# Patient Record
Sex: Male | Born: 1960
Health system: Southern US, Community
[De-identification: ages and names within clinical notes are randomized; demographics above are authoritative.]

## PROBLEM LIST (undated history)

## (undated) DIAGNOSIS — Z9289 Personal history of other medical treatment: Secondary | ICD-10-CM

## (undated) DIAGNOSIS — R4182 Altered mental status, unspecified: Secondary | ICD-10-CM

## (undated) DIAGNOSIS — K860 Alcohol-induced chronic pancreatitis: Secondary | ICD-10-CM

## (undated) DIAGNOSIS — F10239 Alcohol dependence with withdrawal, unspecified: Secondary | ICD-10-CM

## (undated) DIAGNOSIS — D689 Coagulation defect, unspecified: Secondary | ICD-10-CM

## (undated) DIAGNOSIS — I499 Cardiac arrhythmia, unspecified: Secondary | ICD-10-CM

## (undated) DIAGNOSIS — Z832 Family history of diseases of the blood and blood-forming organs and certain disorders involving the immune mechanism: Secondary | ICD-10-CM

## (undated) DIAGNOSIS — R519 Headache, unspecified: Secondary | ICD-10-CM

## (undated) DIAGNOSIS — K859 Acute pancreatitis without necrosis or infection, unspecified: Secondary | ICD-10-CM

## (undated) DIAGNOSIS — F1020 Alcohol dependence, uncomplicated: Secondary | ICD-10-CM

## (undated) DIAGNOSIS — K253 Acute gastric ulcer without hemorrhage or perforation: Secondary | ICD-10-CM

## (undated) DIAGNOSIS — D62 Acute posthemorrhagic anemia: Secondary | ICD-10-CM

## (undated) DIAGNOSIS — E46 Unspecified protein-calorie malnutrition: Secondary | ICD-10-CM

## (undated) DIAGNOSIS — K861 Other chronic pancreatitis: Secondary | ICD-10-CM

## (undated) DIAGNOSIS — K921 Melena: Secondary | ICD-10-CM

## (undated) DIAGNOSIS — K219 Gastro-esophageal reflux disease without esophagitis: Secondary | ICD-10-CM

## (undated) DIAGNOSIS — K7011 Alcoholic hepatitis with ascites: Secondary | ICD-10-CM

## (undated) DIAGNOSIS — F1910 Other psychoactive substance abuse, uncomplicated: Secondary | ICD-10-CM

## (undated) DIAGNOSIS — H1131 Conjunctival hemorrhage, right eye: Secondary | ICD-10-CM

## (undated) DIAGNOSIS — F410 Panic disorder [episodic paroxysmal anxiety] without agoraphobia: Secondary | ICD-10-CM

## (undated) DIAGNOSIS — E876 Hypokalemia: Secondary | ICD-10-CM

## (undated) DIAGNOSIS — K709 Alcoholic liver disease, unspecified: Secondary | ICD-10-CM

## (undated) DIAGNOSIS — N5089 Other specified disorders of the male genital organs: Secondary | ICD-10-CM

## (undated) DIAGNOSIS — Z8619 Personal history of other infectious and parasitic diseases: Secondary | ICD-10-CM

## (undated) DIAGNOSIS — K8689 Other specified diseases of pancreas: Secondary | ICD-10-CM

## (undated) DIAGNOSIS — Z8719 Personal history of other diseases of the digestive system: Secondary | ICD-10-CM

## (undated) DIAGNOSIS — I81 Portal vein thrombosis: Secondary | ICD-10-CM

## (undated) DIAGNOSIS — R569 Unspecified convulsions: Secondary | ICD-10-CM

## (undated) DIAGNOSIS — R51 Headache: Secondary | ICD-10-CM

## (undated) DIAGNOSIS — R079 Chest pain, unspecified: Secondary | ICD-10-CM

## (undated) DIAGNOSIS — I10 Essential (primary) hypertension: Secondary | ICD-10-CM

## (undated) DIAGNOSIS — K2920 Alcoholic gastritis without bleeding: Secondary | ICD-10-CM

## (undated) DIAGNOSIS — R251 Tremor, unspecified: Secondary | ICD-10-CM

## (undated) DIAGNOSIS — G40909 Epilepsy, unspecified, not intractable, without status epilepticus: Principal | ICD-10-CM

## (undated) HISTORY — DX: Personal history of other infectious and parasitic diseases: Z86.19

## (undated) HISTORY — PX: ESOPHAGOGASTRODUODENOSCOPY: SHX1529

## (undated) HISTORY — DX: Other psychoactive substance abuse, uncomplicated: F19.10

## (undated) HISTORY — PX: COLONOSCOPY: SHX174

## (undated) HISTORY — PX: HEMORRHOID SURGERY: SHX153

## (undated) HISTORY — PX: PANCREAS SURGERY: SHX731

## (undated) HISTORY — PX: UPPER GASTROINTESTINAL ENDOSCOPY: SHX188

## (undated) HISTORY — PX: INGUINAL HERNIA REPAIR: SUR1180

---

## 2009-04-08 LAB — METABOLIC PANEL, BASIC
Anion gap: 13 mmol/L (ref 5–15)
BUN/Creatinine ratio: 13 (ref 12–20)
BUN: 9 MG/DL (ref 7–18)
CO2: 25 MMOL/L (ref 21–32)
Calcium: 7.7 MG/DL — ABNORMAL LOW (ref 8.4–10.4)
Chloride: 105 MMOL/L (ref 100–108)
Creatinine: 0.7 mg/dL (ref 0.6–1.3)
GFR est AA: >60 mL/min/{1.73_m2} (ref >60)
GFR est non-AA: >60 mL/min/{1.73_m2} (ref >60)
Glucose: 116 MG/DL — ABNORMAL HIGH (ref 74–99)
Potassium: 3.4 MMOL/L — ABNORMAL LOW (ref 3.5–5.5)
Sodium: 143 MMOL/L (ref 136–145)

## 2009-04-08 LAB — DRUG SCREEN, URINE
AMPHETAMINES: NEGATIVE
BARBITURATES: NEGATIVE
BENZODIAZEPINES: NEGATIVE
COCAINE: NEGATIVE
METHADONE: NEGATIVE
OPIATES: NEGATIVE
PCP(PHENCYCLIDINE): NEGATIVE
THC (TH-CANNABINOL): NEGATIVE

## 2009-04-08 LAB — CBC WITH AUTOMATED DIFF
ABS. EOSINOPHILS: 0 10*3/uL (ref 0.0–0.4)
ABS. LYMPHOCYTES: 0.8 10*3/uL (ref 0.8–3.5)
ABS. MONOCYTES: 0.8 10*3/uL (ref 0–1.0)
ABS. NEUTROPHILS: 7.8 10*3/uL (ref 1.8–8.0)
BASOPHILS: 0 % (ref 0–3)
EOSINOPHILS: 0 % (ref 0–5)
HCT: 41.7 % (ref 37.0–49.0)
HGB: 14.4 g/dL (ref 13.0–16.0)
LYMPHOCYTES: 8 % — ABNORMAL LOW (ref 20–51)
MCH: 32.5 PG (ref 25.0–35.0)
MCHC: 34.5 g/dL (ref 31.0–37.0)
MCV: 94.1 FL (ref 78.0–98.0)
MONOCYTES: 9 % (ref 2–9)
MPV: 6.1 FL — ABNORMAL LOW (ref 7.4–10.4)
NEUTROPHILS: 83 % — ABNORMAL HIGH (ref 42–75)
PLATELET: 180 10*3/uL (ref 130–400)
RBC: 4.43 M/uL — ABNORMAL LOW (ref 4.50–5.30)
RDW: 15 % — ABNORMAL HIGH (ref 11.5–14.5)
WBC: 9.5 10*3/uL (ref 4.5–13.0)

## 2009-04-08 LAB — HEPATIC FUNCTION PANEL
A-G Ratio: 1.1 (ref 0.8–1.7)
ALT (SGPT): 54 U/L (ref 30–65)
AST (SGOT): 51 U/L — ABNORMAL HIGH (ref 15–37)
Albumin: 3.5 g/dL (ref 3.4–5.0)
Alk. phosphatase: 229 U/L — ABNORMAL HIGH (ref 50–136)
Bilirubin, direct: 0.1 MG/DL (ref 0.0–0.3)
Bilirubin, total: 0.4 MG/DL (ref 0.1–0.9)
Globulin: 3.1 g/dL (ref 2.0–4.0)
Protein, total: 6.6 g/dL (ref 6.4–8.2)

## 2009-04-08 LAB — URINALYSIS W/ RFLX MICROSCOPIC
Bilirubin: NEGATIVE
Blood: NEGATIVE
Glucose: NEGATIVE MG/DL
Ketone: 15 MG/DL — AB
Leukocyte Esterase: NEGATIVE
Nitrites: NEGATIVE
Specific gravity: >1.03 — ABNORMAL HIGH (ref 1.003–1.030)
Urobilinogen: 0.2 EU/DL (ref 0.2–1.0)
pH (UA): 5.5 (ref 5.0–8.0)

## 2009-04-08 LAB — URINE MICROSCOPIC ONLY
Bacteria: NEGATIVE /HPF
Epithelial cells: NEGATIVE /LPF (ref 0–5)
RBC: NEGATIVE /HPF (ref 0–5)
WBC: NEGATIVE /HPF (ref 0–4)

## 2009-04-08 LAB — LIPASE: Lipase: 9607 U/L — ABNORMAL HIGH (ref 73–393)

## 2009-04-08 LAB — ETHYL ALCOHOL: ALCOHOL(ETHYL),SERUM: 260 MG/DL — ABNORMAL HIGH (ref 0–3)

## 2009-04-09 LAB — METABOLIC PANEL, BASIC
Anion gap: 13 mmol/L (ref 5–15)
BUN/Creatinine ratio: 10 — ABNORMAL LOW (ref 12–20)
BUN: 7 MG/DL (ref 7–18)
CO2: 25 MMOL/L (ref 21–32)
Calcium: 8.2 MG/DL — ABNORMAL LOW (ref 8.4–10.4)
Chloride: 104 MMOL/L (ref 100–108)
Creatinine: 0.7 mg/dL (ref 0.6–1.3)
GFR est AA: >60 mL/min/{1.73_m2} (ref >60)
GFR est non-AA: >60 mL/min/{1.73_m2} (ref >60)
Glucose: 121 MG/DL — ABNORMAL HIGH (ref 74–99)
Potassium: 4.2 MMOL/L (ref 3.5–5.5)
Sodium: 142 MMOL/L (ref 136–145)

## 2009-04-09 LAB — HEPATIC FUNCTION PANEL
A-G Ratio: 1.1 (ref 0.8–1.7)
ALT (SGPT): 47 U/L (ref 30–65)
AST (SGOT): 35 U/L (ref 15–37)
Albumin: 3.2 g/dL — ABNORMAL LOW (ref 3.4–5.0)
Alk. phosphatase: 222 U/L — ABNORMAL HIGH (ref 50–136)
Bilirubin, direct: 0.2 MG/DL (ref 0.0–0.3)
Bilirubin, total: 0.9 MG/DL (ref 0.1–0.9)
Globulin: 2.9 g/dL (ref 2.0–4.0)
Protein, total: 6.1 g/dL — ABNORMAL LOW (ref 6.4–8.2)

## 2009-04-09 LAB — LD: LD: 179 U/L (ref 100–190)

## 2009-04-09 LAB — LIPASE: Lipase: 2864 U/L — ABNORMAL HIGH (ref 73–393)

## 2009-04-09 LAB — PHENYTOIN: Phenytoin: 1.2 ug/mL — ABNORMAL LOW (ref 10.0–20.0)

## 2009-04-09 LAB — PROTHROMBIN TIME + INR
INR: 0.9 (ref 0.0–1.2)
Prothrombin time: 12.5 SECS (ref 11.5–15.2)

## 2009-04-09 LAB — AMYLASE: Amylase: 373 U/L — ABNORMAL HIGH (ref 25–115)

## 2009-04-10 LAB — CBC WITH AUTOMATED DIFF
ABS. LYMPHOCYTES: 1 10*3/uL (ref 0.8–3.5)
ABS. MONOCYTES: 0.7 10*3/uL (ref 0–1.0)
ABS. NEUTROPHILS: 4.5 10*3/uL (ref 1.8–8.0)
BASOPHILS: 1 % (ref 0–3)
EOSINOPHILS: 1 % (ref 0–5)
HCT: 40.8 % (ref 37.0–49.0)
HGB: 13.8 g/dL (ref 13.0–16.0)
LYMPHOCYTES: 16 % — ABNORMAL LOW (ref 20–51)
MCH: 32.3 PG (ref 25.0–35.0)
MCHC: 33.9 g/dL (ref 31.0–37.0)
MCV: 95.1 FL (ref 78.0–98.0)
MONOCYTES: 12 % — ABNORMAL HIGH (ref 2–9)
MPV: 7 FL — ABNORMAL LOW (ref 7.4–10.4)
NEUTROPHILS: 70 % (ref 42–75)
PLATELET: 115 10*3/uL — ABNORMAL LOW (ref 130–400)
RBC: 4.29 M/uL — ABNORMAL LOW (ref 4.50–5.30)
RDW: 15.4 % — ABNORMAL HIGH (ref 11.5–14.5)
WBC: 6.3 10*3/uL (ref 4.5–13.0)

## 2009-04-10 LAB — METABOLIC PANEL, COMPREHENSIVE
A-G Ratio: 1 (ref 0.8–1.7)
ALT (SGPT): 41 U/L (ref 30–65)
AST (SGOT): 25 U/L (ref 15–37)
Albumin: 3.1 g/dL — ABNORMAL LOW (ref 3.4–5.0)
Alk. phosphatase: 201 U/L — ABNORMAL HIGH (ref 50–136)
Anion gap: 12 mmol/L (ref 5–15)
BUN/Creatinine ratio: 7 — ABNORMAL LOW (ref 12–20)
BUN: 4 MG/DL — ABNORMAL LOW (ref 7–18)
Bilirubin, total: 0.9 MG/DL (ref 0.1–0.9)
CO2: 24 MMOL/L (ref 21–32)
Calcium: 8.2 MG/DL — ABNORMAL LOW (ref 8.4–10.4)
Chloride: 98 MMOL/L — ABNORMAL LOW (ref 100–108)
Creatinine: 0.6 mg/dL (ref 0.6–1.3)
GFR est AA: >60 mL/min/{1.73_m2} (ref >60)
GFR est non-AA: >60 mL/min/{1.73_m2} (ref >60)
Globulin: 3.1 g/dL (ref 2.0–4.0)
Glucose: 67 MG/DL — ABNORMAL LOW (ref 74–99)
Potassium: 4 MMOL/L (ref 3.5–5.5)
Protein, total: 6.2 g/dL — ABNORMAL LOW (ref 6.4–8.2)
Sodium: 134 MMOL/L — ABNORMAL LOW (ref 136–145)

## 2009-04-10 LAB — MAGNESIUM: Magnesium: 1.8 MG/DL (ref 1.8–2.4)

## 2009-04-10 LAB — HEPATIC FUNCTION PANEL
A-G Ratio: 1 (ref 0.8–1.7)
ALT (SGPT): 39 U/L (ref 30–65)
AST (SGOT): 24 U/L (ref 15–37)
Albumin: 3.1 g/dL — ABNORMAL LOW (ref 3.4–5.0)
Alk. phosphatase: 208 U/L — ABNORMAL HIGH (ref 50–136)
Bilirubin, direct: 0.2 MG/DL (ref 0.0–0.3)
Bilirubin, total: 1 MG/DL — ABNORMAL HIGH (ref 0.1–0.9)
Globulin: 3.1 g/dL (ref 2.0–4.0)
Protein, total: 6.2 g/dL — ABNORMAL LOW (ref 6.4–8.2)

## 2009-04-10 LAB — LIPASE: Lipase: 1463 U/L — ABNORMAL HIGH (ref 73–393)

## 2009-04-10 LAB — PHENYTOIN: Phenytoin: 5.4 ug/mL — ABNORMAL LOW (ref 10.0–20.0)

## 2009-04-10 LAB — AMYLASE: Amylase: 186 U/L — ABNORMAL HIGH (ref 25–115)

## 2009-04-10 LAB — PHOSPHORUS: Phosphorus: 2.7 MG/DL (ref 2.5–4.9)

## 2009-04-11 LAB — METABOLIC PANEL, BASIC
Anion gap: 15 mmol/L (ref 5–15)
BUN/Creatinine ratio: 9 — ABNORMAL LOW (ref 12–20)
BUN: 6 MG/DL — ABNORMAL LOW (ref 7–18)
CO2: 24 MMOL/L (ref 21–32)
Calcium: 8.7 MG/DL (ref 8.4–10.4)
Chloride: 99 MMOL/L — ABNORMAL LOW (ref 100–108)
Creatinine: 0.7 mg/dL (ref 0.6–1.3)
GFR est AA: >60 mL/min/{1.73_m2} (ref >60)
GFR est non-AA: >60 mL/min/{1.73_m2} (ref >60)
Glucose: 67 MG/DL — ABNORMAL LOW (ref 74–99)
Potassium: 4.4 MMOL/L (ref 3.5–5.5)
Sodium: 138 MMOL/L (ref 136–145)

## 2009-04-11 LAB — CBC WITH AUTOMATED DIFF
ABS. LYMPHOCYTES: 1 10*3/uL (ref 0.8–3.5)
ABS. MONOCYTES: 1 10*3/uL (ref 0–1.0)
ABS. NEUTROPHILS: 4.3 10*3/uL (ref 1.8–8.0)
BASOPHILS: 0 % (ref 0–3)
EOSINOPHILS: 1 % (ref 0–5)
HCT: 40.7 % (ref 37.0–49.0)
HGB: 14 g/dL (ref 13.0–16.0)
LYMPHOCYTES: 16 % — ABNORMAL LOW (ref 20–51)
MCH: 32.4 PG (ref 25.0–35.0)
MCHC: 34.3 g/dL (ref 31.0–37.0)
MCV: 94.2 FL (ref 78.0–98.0)
MONOCYTES: 16 % — ABNORMAL HIGH (ref 2–9)
MPV: 7.5 FL (ref 7.4–10.4)
NEUTROPHILS: 67 % (ref 42–75)
PLATELET: 162 10*3/uL (ref 130–400)
RBC: 4.32 M/uL — ABNORMAL LOW (ref 4.50–5.30)
RDW: 15.1 % — ABNORMAL HIGH (ref 11.5–14.5)
WBC: 6.4 10*3/uL (ref 4.5–13.0)

## 2009-04-11 LAB — PHENYTOIN: Phenytoin: 6.5 ug/mL — ABNORMAL LOW (ref 10.0–20.0)

## 2009-04-11 LAB — HEPATIC FUNCTION PANEL
A-G Ratio: 1 (ref 0.8–1.7)
ALT (SGPT): 43 U/L (ref 30–65)
AST (SGOT): 23 U/L (ref 15–37)
Albumin: 3.2 g/dL — ABNORMAL LOW (ref 3.4–5.0)
Alk. phosphatase: 188 U/L — ABNORMAL HIGH (ref 50–136)
Bilirubin, direct: 0.1 MG/DL (ref 0.0–0.3)
Bilirubin, total: 0.7 MG/DL (ref 0.1–0.9)
Globulin: 3.2 g/dL (ref 2.0–4.0)
Protein, total: 6.4 g/dL (ref 6.4–8.2)

## 2009-04-11 LAB — MAGNESIUM: Magnesium: 1.9 MG/DL (ref 1.8–2.4)

## 2009-04-11 LAB — AMYLASE: Amylase: 76 U/L (ref 25–115)

## 2009-04-11 LAB — PHOSPHORUS: Phosphorus: 3.4 MG/DL (ref 2.5–4.9)

## 2009-04-12 LAB — METABOLIC PANEL, BASIC
Anion gap: 10 mmol/L (ref 5–15)
BUN/Creatinine ratio: 3 — ABNORMAL LOW (ref 12–20)
BUN: 2 MG/DL — ABNORMAL LOW (ref 7–18)
CO2: 26 MMOL/L (ref 21–32)
Calcium: 8.4 MG/DL (ref 8.4–10.4)
Chloride: 98 MMOL/L — ABNORMAL LOW (ref 100–108)
Creatinine: 0.7 mg/dL (ref 0.6–1.3)
GFR est AA: >60 mL/min/{1.73_m2} (ref >60)
GFR est non-AA: >60 mL/min/{1.73_m2} (ref >60)
Glucose: 115 MG/DL — ABNORMAL HIGH (ref 74–99)
Potassium: 3.8 MMOL/L (ref 3.5–5.5)
Sodium: 134 MMOL/L — ABNORMAL LOW (ref 136–145)

## 2009-04-12 LAB — AMYLASE: Amylase: 73 U/L (ref 25–115)

## 2009-04-12 LAB — HEPATIC FUNCTION PANEL
A-G Ratio: 1.1 (ref 0.8–1.7)
ALT (SGPT): 35 U/L (ref 30–65)
AST (SGOT): 24 U/L (ref 15–37)
Albumin: 3.4 g/dL (ref 3.4–5.0)
Alk. phosphatase: 184 U/L — ABNORMAL HIGH (ref 50–136)
Bilirubin, direct: 0.2 MG/DL (ref 0.0–0.3)
Bilirubin, total: 0.6 MG/DL (ref 0.1–0.9)
Globulin: 3.2 g/dL (ref 2.0–4.0)
Protein, total: 6.6 g/dL (ref 6.4–8.2)

## 2009-04-12 LAB — LIPASE: Lipase: 393 U/L (ref 73–393)

## 2009-04-13 LAB — HEPATIC FUNCTION PANEL
A-G Ratio: 1 (ref 0.8–1.7)
ALT (SGPT): 35 U/L (ref 30–65)
AST (SGOT): 22 U/L (ref 15–37)
Albumin: 2.9 g/dL — ABNORMAL LOW (ref 3.4–5.0)
Alk. phosphatase: 150 U/L — ABNORMAL HIGH (ref 50–136)
Bilirubin, direct: 0.1 MG/DL (ref 0.0–0.3)
Bilirubin, total: 0.3 MG/DL (ref 0.1–0.9)
Globulin: 2.8 g/dL (ref 2.0–4.0)
Protein, total: 5.7 g/dL — ABNORMAL LOW (ref 6.4–8.2)

## 2009-04-13 LAB — METABOLIC PANEL, BASIC
Anion gap: 8 mmol/L (ref 5–15)
BUN/Creatinine ratio: 2 — ABNORMAL LOW (ref 12–20)
BUN: 1 MG/DL — ABNORMAL LOW (ref 7–18)
CO2: 28 MMOL/L (ref 21–32)
Calcium: 8.2 MG/DL — ABNORMAL LOW (ref 8.4–10.4)
Chloride: 102 MMOL/L (ref 100–108)
Creatinine: 0.6 mg/dL (ref 0.6–1.3)
GFR est AA: >60 mL/min/{1.73_m2} (ref >60)
GFR est non-AA: >60 mL/min/{1.73_m2} (ref >60)
Glucose: 116 MG/DL — ABNORMAL HIGH (ref 74–99)
Potassium: 3 MMOL/L — ABNORMAL LOW (ref 3.5–5.5)
Sodium: 138 MMOL/L (ref 136–145)

## 2009-04-13 LAB — LIPASE: Lipase: 269 U/L (ref 73–393)

## 2009-04-13 LAB — AMYLASE: Amylase: 76 U/L (ref 25–115)

## 2009-07-09 LAB — DRUG SCREEN UR - W/ CONFIRM
AMPHETAMINES: NEGATIVE
BARBITURATES: NEGATIVE
BENZODIAZEPINES: NEGATIVE
COCAINE: NEGATIVE
METHADONE: NEGATIVE
OPIATES: POSITIVE — AB
PCP(PHENCYCLIDINE): NEGATIVE
THC (TH-CANNABINOL): NEGATIVE

## 2009-07-09 LAB — METABOLIC PANEL, COMPREHENSIVE
A-G Ratio: 1.1 (ref 0.8–1.7)
ALT (SGPT): 72 U/L — ABNORMAL HIGH (ref 30–65)
AST (SGOT): 115 U/L — ABNORMAL HIGH (ref 15–37)
Albumin: 4 g/dL (ref 3.4–5.0)
Alk. phosphatase: 215 U/L — ABNORMAL HIGH (ref 50–136)
Anion gap: 12 mmol/L (ref 5–15)
BUN/Creatinine ratio: 12 (ref 12–20)
BUN: 7 MG/DL (ref 7–18)
Bilirubin, total: 0.7 MG/DL (ref 0.1–0.9)
CO2: 26 MMOL/L (ref 21–32)
Calcium: 9 MG/DL (ref 8.4–10.4)
Chloride: 102 MMOL/L (ref 100–108)
Creatinine: 0.6 MG/DL (ref 0.6–1.3)
GFR est AA: >60 mL/min/{1.73_m2} (ref >60)
GFR est non-AA: >60 mL/min/{1.73_m2} (ref >60)
Globulin: 3.6 g/dL (ref 2.0–4.0)
Glucose: 106 MG/DL — ABNORMAL HIGH (ref 74–99)
Potassium: 4 MMOL/L (ref 3.5–5.5)
Protein, total: 7.6 g/dL (ref 6.4–8.2)
Sodium: 140 MMOL/L (ref 136–145)

## 2009-07-09 LAB — CBC WITH AUTOMATED DIFF
ABS. LYMPHOCYTES: 1.5 10*3/uL (ref 0.8–3.5)
ABS. MONOCYTES: 0.7 10*3/uL (ref 0–1.0)
ABS. NEUTROPHILS: 3.1 10*3/uL (ref 1.8–8.0)
BASOPHILS: 2 % (ref 0–3)
EOSINOPHILS: 3 % (ref 0–5)
HCT: 45.7 % (ref 37.0–49.0)
HGB: 15.7 g/dL (ref 13.0–16.0)
LYMPHOCYTES: 28 % (ref 20–51)
MCH: 33.2 PG (ref 25.0–35.0)
MCHC: 34.4 g/dL (ref 31.0–37.0)
MCV: 96.3 FL (ref 78.0–98.0)
MONOCYTES: 12 % — ABNORMAL HIGH (ref 2–9)
MPV: 6.7 FL — ABNORMAL LOW (ref 7.4–10.4)
NEUTROPHILS: 55 % (ref 42–75)
PLATELET: 157 10*3/uL (ref 130–400)
RBC: 4.74 M/uL (ref 4.50–5.30)
RDW: 13.7 % (ref 11.5–14.5)
WBC: 5.5 10*3/uL (ref 4.5–13.0)

## 2009-07-09 LAB — CPK-MB PANEL
CK - MB: 0.6 ng/ml (ref 0.5–3.6)
CK-MB Index: 0.8 % (ref 0.0–4.0)
CK: 75 U/L (ref 35–232)
Troponin-I, QT: <0.04 NG/ML (ref 0.00–0.08)

## 2009-07-09 LAB — AMYLASE: Amylase: 458 U/L — ABNORMAL HIGH (ref 25–115)

## 2009-07-09 LAB — LIPASE: Lipase: 5901 U/L — ABNORMAL HIGH (ref 73–393)

## 2009-07-09 LAB — LD: LD: 271 U/L — ABNORMAL HIGH (ref 100–190)

## 2009-07-09 NOTE — H&P (Signed)
[facility]   [address] [place], [place] [address]     HISTORY AND PHYSICAL REPORT    PATIENT: Jeremy Walters, Jeremy Walters  BILLING: [id number] ADMITTED: 07/09/2009  MRN: [id number] LOCATION:  ATTENDING: Nada Libman, M.D.  DICTATING: Amedeo Kinsman, MD      CHIEF COMPLAINT: Nausea, vomiting, and abdominal pain.    HISTORY: The patient is 48 years old with history of alcohol use, history  of recurrent alcohol related pancreatitis, seizure disorder, and history of  large pancreatic pseudocyst requiring endoscopic ultrasound/endoscopic  retrograde cholangiopancreatography, guided cystogastrostomy in 2005, and  seizure disorder. The patient presented to the emergency department with  chief complaint of nausea, vomiting, and upper abdominal pain for 2 days.  He has been drinking every other day for the last 2 months and more heavily  over the last 3 days. He was released from treatment in May. He denies any  hematemesis, melena, or bleeding. He describes vomiting productive of  undigested food. He denies having any seizure. His last seizure was a year  ago. He is currently not using his Dilantin. In the emergency department,  the patient was found to have elevated lipase. His CT scan of the abdomen  is pending. He is being admitted to the medical service for further  management of acute pancreatitis.    REVIEW OF SYSTEMS: No dysuria or hematuria. No headaches. No fever. No  cough. No chest pain. Review of systems otherwise noncontributory.    ALLERGIES: NO KNOWN DRUG ALLERGIES.    SOCIAL HISTORY: The patient drinks alcohol. He is a smoker. He lives with  a friend.    FAMILY HISTORY: Positive for diabetes and hypertension.    HOME MEDICATIONS: The patient is not currently using any medications. He  was using Dilantin a couple of months ago.    MEDICAL HISTORY: As above. Significant for history of pancreatitis,  alcoholism, seizure disorder. History of cystogastrostomy in year 2005 for   large pancreatic pseudocyst. The patient was seen by Dr. Danelle Earthly in the  past.    PHYSICAL EXAMINATION  GENERAL: The patient is alert. He is not presently in distress. Blood  pressure of 140/100, heart rate of 113, temperature 98.9.  HEENT: Head exam shows no signs of trauma. Eyes have intact extraocular  movements. Oral exam has dry oral mucosa with no lesions.  NECK: No cervical adenopathy or thyromegaly.  CHEST: He has clear lung fields bilaterally.  CARDIAC: Regular rhythm with no gallop.  ABDOMEN: Soft. He has epigastric tenderness. He has no guarding or  rigidity. He has hypoactive bowel sounds. No organomegaly.  LOWER EXTREMITIES: Has no extremity edema.  NEUROLOGIC: Strength normal in all extremities. No facial asymmetry.  Cranial nerves are grossly intact. He has no focal deficits.    ADMISSION LABORATORIES: White cell count 5.5, hemoglobin 15.7, hematocrit  45.7, platelets of 157. Sodium of 140, potassium 4.0, chloride 102, CO2 26,  glucose of 106, BUN of 7, creatinine of 0.6. Alkaline phosphatase of 215,  AST of 115, total bilirubin of 0.6, lipase of 5901, amylase of 458.  Troponin of less than 0.04. LDH of 271. CT of the abdomen is presently  pending.    ADMISSION ASSESSMENT AND PLAN  1. Acute pancreatitis, alcohol related.  2. Alcoholism.  3. History of pancreatic pseudocyst in the past.  4. Seizure disorder.  5. History of hypertension.    PLAN: The patient will be admitted to the medical service. We will keep  the patient  n.p.o., hydrate the patient with IV fluids, provide pain  control, proton pump inhibitor. CT scan of the abdomen is presently  pending. If it shows an abnormality, will need to consult GI. The patient  seen by Dr. Danelle Earthly in the past. We will place the patient on delirium  tremens prophylaxis. Monitor his vital signs as he reports history of  hypertension. Will give 1 dose of atenolol. Restart the patient on  Dilantin. The patient is FULL CODE.                        Electronically Signed   Amedeo Kinsman, MD 07/09/2009 19:16   Jeremy Marisue Ivan, MD        NAM:[id number]  D: 07/09/2009 6:22 A T: 07/09/2009 6:54 A  Job #: [id number] CScriptDoc #: [id number]  cc: JEFFREY Cooper Render, M.D.   Jeremy A MOHAMED, MD

## 2009-07-10 LAB — METABOLIC PANEL, COMPREHENSIVE
A-G Ratio: 1.3 (ref 0.8–1.7)
ALT (SGPT): 53 U/L (ref 30–65)
AST (SGOT): 53 U/L — ABNORMAL HIGH (ref 15–37)
Albumin: 3.8 g/dL (ref 3.4–5.0)
Alk. phosphatase: 201 U/L — ABNORMAL HIGH (ref 50–136)
Anion gap: 6 mmol/L (ref 5–15)
BUN/Creatinine ratio: 7 — ABNORMAL LOW (ref 12–20)
BUN: 4 MG/DL — ABNORMAL LOW (ref 7–18)
Bilirubin, total: 1.3 MG/DL — ABNORMAL HIGH (ref 0.1–0.9)
CO2: 31 MMOL/L (ref 21–32)
Calcium: 8.8 MG/DL (ref 8.4–10.4)
Chloride: 95 MMOL/L — ABNORMAL LOW (ref 100–108)
Creatinine: 0.6 MG/DL (ref 0.6–1.3)
GFR est AA: >60 mL/min/{1.73_m2} (ref >60)
GFR est non-AA: >60 mL/min/{1.73_m2} (ref >60)
Globulin: 2.9 g/dL (ref 2.0–4.0)
Glucose: 85 MG/DL (ref 74–99)
Potassium: 3.7 MMOL/L (ref 3.5–5.5)
Protein, total: 6.7 g/dL (ref 6.4–8.2)
Sodium: 132 MMOL/L — ABNORMAL LOW (ref 136–145)

## 2009-07-10 LAB — PHOSPHORUS: Phosphorus: 3 MG/DL (ref 2.5–4.9)

## 2009-07-10 LAB — LIPASE: Lipase: 3290 U/L — ABNORMAL HIGH (ref 73–393)

## 2009-07-10 LAB — AMYLASE: Amylase: 318 U/L — ABNORMAL HIGH (ref 25–115)

## 2009-07-10 LAB — MAGNESIUM: Magnesium: 1.6 MG/DL — ABNORMAL LOW (ref 1.8–2.4)

## 2009-07-11 LAB — CBC WITH AUTOMATED DIFF
ABS. EOSINOPHILS: 0.1 10*3/uL (ref 0.0–0.4)
ABS. LYMPHOCYTES: 0.8 10*3/uL (ref 0.8–3.5)
ABS. MONOCYTES: 0.8 10*3/uL (ref 0–1.0)
ABS. NEUTROPHILS: 3.2 10*3/uL (ref 1.8–8.0)
BASOPHILS: 1 % (ref 0–3)
EOSINOPHILS: 3 % (ref 0–5)
HCT: 42.7 % (ref 37.0–49.0)
HGB: 14.9 g/dL (ref 13.0–16.0)
LYMPHOCYTES: 17 % — ABNORMAL LOW (ref 20–51)
MCH: 34.1 PG (ref 25.0–35.0)
MCHC: 34.9 g/dL (ref 31.0–37.0)
MCV: 97.9 FL (ref 78.0–98.0)
MONOCYTES: 15 % — ABNORMAL HIGH (ref 2–9)
MPV: 7.6 FL (ref 7.4–10.4)
NEUTROPHILS: 64 % (ref 42–75)
PLATELET: 120 10*3/uL — ABNORMAL LOW (ref 130–400)
RBC: 4.36 M/uL — ABNORMAL LOW (ref 4.50–5.30)
RDW: 13.5 % (ref 11.5–14.5)
WBC: 5 10*3/uL (ref 4.5–13.0)

## 2009-07-11 LAB — METABOLIC PANEL, BASIC
Anion gap: 7 mmol/L (ref 5–15)
BUN/Creatinine ratio: 4 — ABNORMAL LOW (ref 12–20)
BUN: 3 MG/DL — ABNORMAL LOW (ref 7–18)
CO2: 31 MMOL/L (ref 21–32)
Calcium: 9 MG/DL (ref 8.4–10.4)
Chloride: 93 MMOL/L — ABNORMAL LOW (ref 100–108)
Creatinine: 0.7 MG/DL (ref 0.6–1.3)
GFR est AA: >60 mL/min/{1.73_m2} (ref >60)
GFR est non-AA: >60 mL/min/{1.73_m2} (ref >60)
Glucose: 113 MG/DL — ABNORMAL HIGH (ref 74–99)
Potassium: 3.6 MMOL/L (ref 3.5–5.5)
Sodium: 131 MMOL/L — ABNORMAL LOW (ref 136–145)

## 2009-07-11 LAB — PHOSPHORUS: Phosphorus: 3.5 MG/DL (ref 2.5–4.9)

## 2009-07-11 LAB — MAGNESIUM: Magnesium: 1.8 MG/DL (ref 1.8–2.4)

## 2009-07-11 LAB — LIPASE: Lipase: 1646 U/L — ABNORMAL HIGH (ref 73–393)

## 2009-07-11 LAB — AMYLASE: Amylase: 193 U/L — ABNORMAL HIGH (ref 25–115)

## 2009-07-12 LAB — METABOLIC PANEL, BASIC
Anion gap: 14 mmol/L (ref 5–15)
BUN/Creatinine ratio: 7 — ABNORMAL LOW (ref 12–20)
BUN: 5 MG/DL — ABNORMAL LOW (ref 7–18)
CO2: 26 MMOL/L (ref 21–32)
Calcium: 8.8 MG/DL (ref 8.4–10.4)
Chloride: 95 MMOL/L — ABNORMAL LOW (ref 100–108)
Creatinine: 0.7 MG/DL (ref 0.6–1.3)
GFR est AA: >60 mL/min/{1.73_m2} (ref >60)
GFR est non-AA: >60 mL/min/{1.73_m2} (ref >60)
Glucose: 76 MG/DL (ref 74–99)
Potassium: 3.4 MMOL/L — ABNORMAL LOW (ref 3.5–5.5)
Sodium: 135 MMOL/L — ABNORMAL LOW (ref 136–145)

## 2009-07-12 LAB — CBC WITH AUTOMATED DIFF
ABS. LYMPHOCYTES: 0.7 10*3/uL — ABNORMAL LOW (ref 0.8–3.5)
ABS. MONOCYTES: 0.8 10*3/uL (ref 0–1.0)
ABS. NEUTROPHILS: 3.4 10*3/uL (ref 1.8–8.0)
BASOPHILS: 0 % (ref 0–3)
EOSINOPHILS: 2 % (ref 0–5)
HCT: 41.7 % (ref 37.0–49.0)
HGB: 14.3 g/dL (ref 13.0–16.0)
LYMPHOCYTES: 14 % — ABNORMAL LOW (ref 20–51)
MCH: 33.2 PG (ref 25.0–35.0)
MCHC: 34.1 g/dL (ref 31.0–37.0)
MCV: 97.1 FL (ref 78.0–98.0)
MONOCYTES: 16 % — ABNORMAL HIGH (ref 2–9)
MPV: 7.7 FL (ref 7.4–10.4)
NEUTROPHILS: 68 % (ref 42–75)
PLATELET: 129 10*3/uL — ABNORMAL LOW (ref 130–400)
RBC: 4.29 M/uL — ABNORMAL LOW (ref 4.50–5.30)
RDW: 13.6 % (ref 11.5–14.5)
WBC: 4.9 10*3/uL (ref 4.5–13.0)

## 2009-07-12 LAB — MAGNESIUM: Magnesium: 1.7 MG/DL — ABNORMAL LOW (ref 1.8–2.4)

## 2009-07-12 LAB — AMYLASE: Amylase: 148 U/L — ABNORMAL HIGH (ref 25–115)

## 2009-07-12 LAB — LIPASE: Lipase: 997 U/L — ABNORMAL HIGH (ref 73–393)

## 2009-07-12 LAB — PHOSPHORUS: Phosphorus: 3.9 MG/DL (ref 2.5–4.9)

## 2009-07-13 LAB — METABOLIC PANEL, BASIC
Anion gap: 10 mmol/L (ref 5–15)
BUN/Creatinine ratio: 4 — ABNORMAL LOW (ref 12–20)
BUN: 3 MG/DL — ABNORMAL LOW (ref 7–18)
CO2: 27 MMOL/L (ref 21–32)
Calcium: 8.7 MG/DL (ref 8.4–10.4)
Chloride: 100 MMOL/L (ref 100–108)
Creatinine: 0.7 MG/DL (ref 0.6–1.3)
GFR est AA: >60 mL/min/{1.73_m2} (ref >60)
GFR est non-AA: >60 mL/min/{1.73_m2} (ref >60)
Glucose: 94 MG/DL (ref 74–99)
Potassium: 3.7 MMOL/L (ref 3.5–5.5)
Sodium: 137 MMOL/L (ref 136–145)

## 2009-07-13 LAB — MAGNESIUM: Magnesium: 1.8 MG/DL (ref 1.8–2.4)

## 2009-07-13 LAB — OPIATES, UR: Opiates,confirmation: POSITIVE

## 2009-07-13 LAB — LIPASE: Lipase: 460 U/L — ABNORMAL HIGH (ref 73–393)

## 2009-07-13 NOTE — Discharge Summary (Unsigned)
[facility]   [address] [place] [address]     DISCHARGE SUMMARY    PATIENT: Jeremy Walters, Jeremy Walters  MRN: [id number] ADMITTED: 07/09/2009  BILLING: [id number] DISCHARGED: 07/13/2009  ATTENDING: Amedeo Kinsman, MD  DICTATING: Marlana Salvage, MD    DIAGNOSES  1. Acute pancreatitis.  2. History of pancreatic pseudocyst status post drainage.  3. Hypertension with poor control.  4. Alcohol abuse.  5. Hypokalemia.  6. Hypomagnesemia.  7. History of seizure disorder.  8. Asymptomatic hyponatremia resolved.    DISCHARGE MEDICATIONS: The patient did not know his medications on  arrival. Therefore these are probable new medications.  1. Clonidine 0.1 mg p.o. every 8 hours.  2. Magnesium oxide 400 mg twice daily.  3. Atenolol 25 mg p.o. b.i.d.  4. Dilantin 100 mg p.o. twice daily.  5. Protonix 40 mg daily.    CONDITION ON DISCHARGE: Stable.    FOLLOWUP CARE: The patient is to follow up with the [facility] in 1  to 2 weeks and with Dr. Danelle Earthly. He can follow up with him in the next 4 to 6  weeks.    ADVANCED DIRECTIVES: None.    PATIENT INSTRUCTIONS AND LIMITATIONS: The patient's diet is a low fat  diet. Activity is as tolerated.    RESTRICTIONS: None.    REASON FOR ADMISSION: Acute alcohol-induced pancreatitis.    ADMISSION PHYSICAL EXAMINATION  GENERAL: The patient is alert and in no acute distress.  VITAL SIGNS: Blood pressure 140/100, heart rate 113, temperature of 98.9.  HEENT: Head shows no signs of trauma. Eyes have intact extraocular  movements. Oral exam shows dry oral mucosa with no lesions.  NECK: No cervical adenopathy or thyromegaly.  CHEST: He has clear lung fields bilaterally.  CARDIAC: Regular rhythm with no gallop.  ABDOMEN: Soft. He has epigastric tenderness. No guarding or rigidity. He  has hypoactive bowel sounds. No organomegaly.  EXTREMITIES: No edema.  NEUROLOGIC: Strength is normal in all extremities. No facial asymmetry.   Cranial nerves grossly intact. No focal deficits.    CONSULTANTS: None during this admission.    LABORATORY AND ANCILLARY DATA: White blood cell count 5.5, hemoglobin  15.7, hematocrit 45.7, platelet count 157,000. Sodium 140, potassium 4.0,  chloride of 102, CO2 of 26, glucose 106, BUN 7, creatinine 0.6. Alkaline  phosphatase 215, AST of 115. Total bilirubin 1.3, magnesium of 1.6. Lipase  was initially 3290, amylase 318. At time of discharge, the lipase was 460.  Troponin was less than 0.04. LDH 271.    Urine drug screen was positive for opiates.    Ultrasound of the abdomen revealed a small amount of fluid adjacent to the  pancreas and hepatic steatosis.    CT of the abdomen revealed findings consistent with acute pancreatitis.    Abdominal x-ray was negative for any ileus or obstruction.    HOSPITAL COURSE AND TREATMENT: This is a 48 year old Caucasian male with  history of alcohol abuse and alcohol-related pancreatitis who presented to  the emergency room complaining of nausea, vomiting, and abdominal pain. He  had not drank for year and started drinking again after the death of a  friend. He did not have any hematemesis, melena, or bleeding but was found  to have an elevated lipase and was admitted to the hospital medicine  division for further management of acute pancreatitis. He was initially  placed n.p.o. and was hydrated with IV fluids and given IV pain control and  placed on a proton  pump inhibitor. Eventually his diet was advanced to a  low-fat diet, which he tolerated without any problems. His blood pressure  was a concern as it was accelerated on admission. He did not know the names  of his blood pressure medications but was not compliant with them anyway.  He was started on clonidine 3 times daily and atenolol with good results.  His vital signs have been stable. His electrolytes have been replaced. He  is ambulating in the hallway and it is appropriate to discharge him at this   time. He agrees to alcohol cessation and understands the importance of  close outpatient followup for his acute and chronic pancreatitis as well as  blood pressure control.        Dictated By: Felicie Morn, PA-C             Preliminary   Marlana Salvage, MD    MAD:[id number]  D: 07/13/2009 10:00 A T: 07/13/2009 11:58 A  Job #: [id number] CScriptDoc #: [id number]  cc: [facility]   Marlana Salvage, MD   PRAMOD Danelle Earthly, MD   Amedeo Kinsman, MD

## 2011-02-26 LAB — CBC WITH AUTOMATED DIFF
ABS. BASOPHILS: 0 10*3/uL (ref 0.0–0.06)
ABS. EOSINOPHILS: 0 10*3/uL (ref 0.0–0.4)
ABS. LYMPHOCYTES: 0.8 10*3/uL — ABNORMAL LOW (ref 0.9–3.6)
ABS. MONOCYTES: 0.7 10*3/uL (ref 0.05–1.2)
ABS. NEUTROPHILS: 4.9 10*3/uL (ref 1.8–8.0)
BASOPHILS: 0 % (ref 0–2)
EOSINOPHILS: 0 % (ref 0–5)
HCT: 41.4 % (ref 36.0–48.0)
HGB: 14.1 g/dL (ref 13.0–16.0)
LYMPHOCYTES: 12 % — ABNORMAL LOW (ref 21–52)
MCH: 32.4 PG (ref 24.0–34.0)
MCHC: 34.1 g/dL (ref 31.0–37.0)
MCV: 95.2 FL (ref 74.0–97.0)
MONOCYTES: 11 % — ABNORMAL HIGH (ref 3–10)
MPV: 10.7 FL (ref 9.2–11.8)
NEUTROPHILS: 77 % — ABNORMAL HIGH (ref 40–73)
PLATELET: 83 10*3/uL — ABNORMAL LOW (ref 135–420)
RBC: 4.35 M/uL — ABNORMAL LOW (ref 4.70–5.50)
RDW: 13.1 % (ref 11.6–14.5)
WBC: 6.5 10*3/uL (ref 4.6–13.2)

## 2011-02-26 LAB — DRUG SCREEN, URINE
AMPHETAMINES: NEGATIVE
BARBITURATES: NEGATIVE
BENZODIAZEPINES: NEGATIVE
COCAINE: NEGATIVE
METHADONE: NEGATIVE
OPIATES: NEGATIVE
PCP(PHENCYCLIDINE): NEGATIVE
THC (TH-CANNABINOL): POSITIVE — AB

## 2011-02-26 LAB — METABOLIC PANEL, BASIC
Anion gap: 20 mmol/L — ABNORMAL HIGH (ref 5–15)
BUN/Creatinine ratio: 5 — ABNORMAL LOW (ref 12–20)
BUN: 7 MG/DL (ref 7–18)
CO2: 21 MMOL/L (ref 21–32)
Calcium: 8.7 MG/DL (ref 8.4–10.4)
Chloride: 95 MMOL/L — ABNORMAL LOW (ref 100–108)
Creatinine: 1.3 MG/DL (ref 0.6–1.3)
GFR est AA: >60 mL/min/{1.73_m2} (ref >60)
GFR est non-AA: >60 mL/min/{1.73_m2} (ref >60)
Glucose: 190 MG/DL — ABNORMAL HIGH (ref 74–99)
Potassium: 3.7 MMOL/L (ref 3.5–5.5)
Sodium: 136 MMOL/L (ref 136–145)

## 2011-02-26 LAB — LIPASE: Lipase: 219 U/L (ref 73–393)

## 2011-02-26 LAB — ETHYL ALCOHOL: ALCOHOL(ETHYL),SERUM: <3 MG/DL (ref 0–3)

## 2011-02-26 LAB — PHENYTOIN: Phenytoin: 0.7 ug/mL — ABNORMAL LOW (ref 10.0–20.0)

## 2011-02-26 NOTE — Procedures (Signed)
[facility]OSCIENCE [facility]ENTER                                                ELECTROENCEPHALOGRAM        PATIENT:    Jeremy Walters, Jeremy Walters  BILLING:        [id number]      DATE:       02/26/2011  MRN:            [id number]       LOCATION:  DICTATING:  Maggie Schwalbe, MD  DOB:            05/13/1961      EEG NUMBER:  [id number]    CLINICAL INFORMATION: The patient presented to the emergency room today  with a seizure recurrence.  A past EEG dated November 2004 relates that the  patient had a history of seizures possibly related to alcohol and was  normal.    DESCRIPTION: This EEG is obtained in the awake state only. The waking  background reveals a 9 cycle per second alfa rhythm posteriorly with normal  posterior-to-anterior gradient. Photic stimulation and hyperventilation do  not alter the background.  Sleep was not obtained.    IMPRESSION: This is a normal awake electroencephalogram.  There is no  evidence of focal lateralized or epileptiform activity.                     Electronically Signed   Maggie Schwalbe, MD 03/09/2011 11:50                                  Maggie Schwalbe, MD    JPH:WMX  D: 02/26/2011 T: 02/26/2011 12:50 P  CQDocID #:  [id number]  CScriptDoc #:  [id number]  cc:   Maggie Schwalbe, MD

## 2011-02-26 NOTE — Procedures (Signed)
NEUROSCIENCE CENTER                                                ELECTROENCEPHALOGRAM        PATIENT:    [name], [name]  BILLING:        [id number]      DATE:       02/26/2011  MRN:            [id number]       LOCATION:  DICTATING:  Saia Derossett P Tawn Fitzner, MD  DOB:            09/30/1961      EEG NUMBER:  [id number]    CLINICAL INFORMATION: The patient presented to the emergency room today  with a seizure recurrence.  A past EEG dated November 2004 relates that the  patient had a history of seizures possibly related to alcohol and was  normal.    DESCRIPTION: This EEG is obtained in the awake state only. The waking  background reveals a 9 cycle per second alfa rhythm posteriorly with normal  posterior-to-anterior gradient. Photic stimulation and hyperventilation do  not alter the background.  Sleep was not obtained.    IMPRESSION: This is a normal awake electroencephalogram.  There is no  evidence of focal lateralized or epileptiform activity.                     Electronically Signed   Akbar Sacra P Gaylen Venning, MD 03/09/2011 11:50                                  Cheralyn Oliver P Charlena Haub, MD    JPH:WMX  D: 02/26/2011 T: 02/26/2011 12:50 P  CQDocID #:  [id number]  CScriptDoc #:  [id number]  cc:   C[place] Jr P Quynh Basso, MD

## 2011-05-11 LAB — CBC WITH AUTOMATED DIFF
ABS. BASOPHILS: 0 10*3/uL (ref 0.0–0.06)
ABS. EOSINOPHILS: 0 10*3/uL (ref 0.0–0.4)
ABS. LYMPHOCYTES: 1 10*3/uL (ref 0.9–3.6)
ABS. MONOCYTES: 0.4 10*3/uL (ref 0.05–1.2)
ABS. NEUTROPHILS: 1.2 10*3/uL — ABNORMAL LOW (ref 1.8–8.0)
BASOPHILS: 1 % (ref 0–2)
EOSINOPHILS: 0 % (ref 0–5)
HCT: 38.9 % (ref 36.0–48.0)
HGB: 13.4 g/dL (ref 13.0–16.0)
LYMPHOCYTES: 38 % (ref 21–52)
MCH: 32.9 PG (ref 24.0–34.0)
MCHC: 34.4 g/dL (ref 31.0–37.0)
MCV: 95.6 FL (ref 74.0–97.0)
MONOCYTES: 15 % — ABNORMAL HIGH (ref 3–10)
MPV: 8.6 FL — ABNORMAL LOW (ref 9.2–11.8)
NEUTROPHILS: 46 % (ref 40–73)
PLATELET: 108 10*3/uL — ABNORMAL LOW (ref 135–420)
RBC: 4.07 M/uL — ABNORMAL LOW (ref 4.70–5.50)
RDW: 13.2 % (ref 11.6–14.5)
WBC: 2.6 10*3/uL — ABNORMAL LOW (ref 4.6–13.2)

## 2011-05-11 LAB — METABOLIC PANEL, BASIC
Anion gap: 12 mmol/L (ref 5–15)
BUN/Creatinine ratio: 10 — ABNORMAL LOW (ref 12–20)
BUN: 6 MG/DL — ABNORMAL LOW (ref 7–18)
CO2: 24 MMOL/L (ref 21–32)
Calcium: 7.4 MG/DL — ABNORMAL LOW (ref 8.4–10.4)
Chloride: 105 MMOL/L (ref 100–108)
Creatinine: 0.6 MG/DL (ref 0.6–1.3)
GFR est AA: >60 mL/min/{1.73_m2} (ref >60)
GFR est non-AA: >60 mL/min/{1.73_m2} (ref >60)
Glucose: 91 MG/DL (ref 74–99)
Potassium: 3.6 MMOL/L (ref 3.5–5.5)
Sodium: 141 MMOL/L (ref 136–145)

## 2011-05-11 LAB — ETHYL ALCOHOL: ALCOHOL(ETHYL),SERUM: 461 MG/DL — CR (ref 0–3)

## 2011-12-26 NOTE — ED Notes (Signed)
Pt states + ETOH-pt states passed out while outside, lying in dog feces.

## 2011-12-26 NOTE — ED Notes (Signed)
Pt states supposed to take Dilaudid and Dilantin; pt denies taking either one due to "being incarcerated".

## 2011-12-27 LAB — METABOLIC PANEL, BASIC
Anion gap: 7 mmol/L (ref 3.0–18)
BUN/Creatinine ratio: 7 — ABNORMAL LOW (ref 12–20)
BUN: 4 MG/DL — ABNORMAL LOW (ref 7.0–18)
CO2: 30 MMOL/L (ref 21–32)
Calcium: 8.4 MG/DL — ABNORMAL LOW (ref 8.5–10.1)
Chloride: 105 MMOL/L (ref 100–108)
Creatinine: 0.55 MG/DL — ABNORMAL LOW (ref 0.6–1.3)
GFR est AA: >60 mL/min/{1.73_m2} (ref >60)
GFR est non-AA: >60 mL/min/{1.73_m2} (ref >60)
Glucose: 92 MG/DL (ref 74–99)
Potassium: 4.3 MMOL/L (ref 3.5–5.5)
Sodium: 142 MMOL/L (ref 136–145)

## 2011-12-27 LAB — URINALYSIS W/ RFLX MICROSCOPIC
Bilirubin: NEGATIVE
Blood: NEGATIVE
Glucose: 100 MG/DL — AB
Ketone: NEGATIVE MG/DL
Leukocyte Esterase: NEGATIVE
Nitrites: NEGATIVE
Protein: NEGATIVE MG/DL
Specific gravity: <1.005 (ref 1.003–1.030)
Urobilinogen: 0.2 EU/DL (ref 0.2–1.0)
pH (UA): 6 (ref 5.0–8.0)

## 2011-12-27 LAB — CBC WITH AUTOMATED DIFF
ABS. BASOPHILS: 0 10*3/uL (ref 0.0–0.06)
ABS. EOSINOPHILS: 0 10*3/uL (ref 0.0–0.4)
ABS. LYMPHOCYTES: 1.5 10*3/uL (ref 0.9–3.6)
ABS. MONOCYTES: 0.6 10*3/uL (ref 0.05–1.2)
ABS. NEUTROPHILS: 1.7 10*3/uL — ABNORMAL LOW (ref 1.8–8.0)
BASOPHILS: 1 % (ref 0–2)
EOSINOPHILS: 1 % (ref 0–5)
HCT: 40.1 % (ref 36.0–48.0)
HGB: 13.8 g/dL (ref 13.0–16.0)
LYMPHOCYTES: 39 % (ref 21–52)
MCH: 32.4 PG (ref 24.0–34.0)
MCHC: 34.4 g/dL (ref 31.0–37.0)
MCV: 94.1 FL (ref 74.0–97.0)
MONOCYTES: 16 % — ABNORMAL HIGH (ref 3–10)
MPV: 9.2 FL (ref 9.2–11.8)
NEUTROPHILS: 43 % (ref 40–73)
PLATELET: 129 10*3/uL — ABNORMAL LOW (ref 135–420)
RBC: 4.26 M/uL — ABNORMAL LOW (ref 4.70–5.50)
RDW: 12.8 % (ref 11.6–14.5)
WBC: 3.9 10*3/uL — ABNORMAL LOW (ref 4.6–13.2)

## 2011-12-27 LAB — EKG, 12 LEAD, SUBSEQUENT
Atrial Rate: 88 {beats}/min
Calculated P Axis: 57 degrees
Calculated R Axis: 18 degrees
Calculated T Axis: 52 degrees
Diagnosis: NORMAL
P-R Interval: 162 ms
Q-T Interval: 362 ms
QRS Duration: 96 ms
QTC Calculation (Bezet): 438 ms
Ventricular Rate: 88 {beats}/min

## 2011-12-27 LAB — HEPATIC FUNCTION PANEL
A-G Ratio: 1.2 (ref 0.8–1.7)
ALT (SGPT): 64 U/L (ref 12.0–78.0)
AST (SGOT): 85 U/L — ABNORMAL HIGH (ref 15–37)
Albumin: 4 g/dL (ref 3.4–5.0)
Alk. phosphatase: 145 U/L — ABNORMAL HIGH (ref 50–136)
Bilirubin, direct: 0.2 MG/DL (ref 0.0–0.2)
Bilirubin, total: 0.4 MG/DL (ref 0.2–1.0)
Globulin: 3.3 g/dL (ref 2.0–4.0)
Protein, total: 7.3 g/dL (ref 6.4–8.2)

## 2011-12-27 LAB — CARDIAC PANEL,(CK, CKMB & TROPONIN)
CK - MB: 1.9 ng/ml (ref 0.5–3.6)
CK-MB Index: 1.9 % (ref 0.0–4.0)
CK: 99 U/L (ref 39–308)
Troponin-I, QT: <0.02 NG/ML (ref 0.0–0.045)

## 2011-12-27 LAB — LIPASE: Lipase: 115 U/L (ref 73–393)

## 2011-12-27 LAB — MAGNESIUM: Magnesium: 2 MG/DL (ref 1.8–2.4)

## 2011-12-27 MED ORDER — MVI, ADULT NO.1 WITH VIT K 1 MG-5 MCG-10 MG-200MG-150MCG IV
3300 unit- 150 mcg/10 mL | Freq: Once | INTRAVENOUS | Status: AC
Start: 2011-12-27 — End: 2011-12-27
  Administered 2011-12-27: 04:00:00 via INTRAVENOUS

## 2011-12-27 MED ADMIN — sodium chloride 0.9 % bolus infusion 1,000 mL: INTRAVENOUS | @ 08:00:00 | NDC 00409798309

## 2011-12-27 MED ADMIN — sodium chloride 0.9 % bolus infusion 1,000 mL: INTRAVENOUS | @ 05:00:00 | NDC 00409798309

## 2011-12-27 MED FILL — SODIUM CHLORIDE 0.9 % IV: INTRAVENOUS | Qty: 1000

## 2011-12-27 NOTE — ED Notes (Signed)
Patient lying on stretcher snoring.  Patient begins snoring loudly with pauses in respirations.  Oxygen saturation drops to 88%.  Patient rolls over, takes a big gasp and began talking garbled words and then oxygen saturation increased to 100%.  All the while patient remained asleep.

## 2011-12-27 NOTE — ED Notes (Signed)
Patient placed on oxygen due to patient having episodes of sleep apnea and oxygen saturation drops to 85% until patient takes deep breath and sat increases to 100%.

## 2011-12-27 NOTE — ED Provider Notes (Signed)
HPI - 51 yo male with a history of alcoholism and chronic pancreatitis presents with a complaint of abdominal pain and nausea. He states he has been homeless since may and lives intermittently on the streets. The patient describes no fever has intermittent nausea. He states he has not been vomiting but states if he drinks to much he will vomit. He has not noticed any blood in his urine or stool.     Past Medical History   Diagnosis Date   ??? Pancreatitis chronic    ??? Seizure         Past Surgical History   Procedure Date   ??? Hx gi      EGD         No family history on file.     History     Social History   ??? Marital Status: SINGLE     Spouse Name: N/A     Number of Children: N/A   ??? Years of Education: N/A     Occupational History   ??? Not on file.     Social History Main Topics   ??? Smoking status: Not on file   ??? Smokeless tobacco: Not on file   ??? Alcohol Use:    ??? Drug Use:    ??? Sexually Active:      Other Topics Concern   ??? Not on file     Social History Narrative   ??? No narrative on file                  ALLERGIES: Review of patient's allergies indicates no known allergies.      Review of Systems   Constitutional: Negative.  Negative for fever and chills.   HENT: Negative.  Negative for congestion, neck stiffness and tinnitus.    Eyes: Negative.    Respiratory: Negative for choking.    Cardiovascular: Negative for leg swelling.   Gastrointestinal: Positive for nausea. Negative for vomiting, diarrhea and constipation.   Genitourinary: Negative.    Musculoskeletal: Negative.    Neurological: Negative.    Hematological: Negative.    Psychiatric/Behavioral: Negative.        Filed Vitals:    12/27/11 0040 12/27/11 0042 12/27/11 0100 12/27/11 0130   BP: 110/78  112/83 108/73   Pulse: 98 91 84 80   Temp:       Resp: 21 16 19 18    SpO2:  97% 97% 95%            Physical Exam   Constitutional: He is oriented to person, place, and time. He appears well-developed and well-nourished.   HENT:   Head: Normocephalic and  atraumatic.   Eyes: EOM are normal. Pupils are equal, round, and reactive to light.   Neck: Neck supple.   Cardiovascular: Normal rate and regular rhythm.  Exam reveals no friction rub.    No murmur heard.  Pulmonary/Chest: Effort normal and breath sounds normal.   Abdominal: There is tenderness. There is no rebound and no guarding.        Patient has epigastric tenderness, no rebound or guarding.    Musculoskeletal: Normal range of motion.   Neurological: He is alert and oriented to person, place, and time.   Skin: Skin is warm and dry.   Psychiatric: He has a normal mood and affect. His behavior is normal. Thought content normal.        MDM     Differential Diagnosis; Clinical Impression; Plan:  Patient has chronic alcohol abuse and pancreatitis. 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 Will hydrate and lab.   Amount and/or Complexity of Data Reviewed:   Clinical lab tests:  Ordered and reviewed   Discuss the patient with another provider:  Yes      Procedures

## 2011-12-27 NOTE — ED Provider Notes (Signed)
I was personally available for consultation in the emergency department.  I have reviewed the chart and agree with the documentation recorded by the MLP, including the assessment, treatment plan, and disposition.  Keaundra Stehle L Azalya Galyon, MD

## 2011-12-27 NOTE — ED Notes (Signed)
If you experience chest pain call 911. Do not drive yourself.   I have reviewed discharge instructions with the patient.  The patient verbalized understanding.

## 2012-05-12 NOTE — ED Notes (Signed)
Patient arrives via [facility] for c/o alcohol intoxication and need for detox

## 2012-05-12 NOTE — ED Provider Notes (Signed)
HPI Comments: 51 year old male to the emergency room with a c/o alcohol intoxication, CP, SOB, and abdominal pain.  Pt states that his mother just passed away, and he feels forlorn concerning this.  He admits to being a long time alcoholic, and adds that he occasionally smokes crack cocaine and smokes marijuana regularly.  He concludes by saying he wants help in changing his lifestyle to less destructive behavior.      Patient is a 51 y.o. male presenting with intoxication, chest pain, and abdominal pain. The history is provided by the patient. History Limited By: No communication barrier.   Alcohol intoxication  Primary symptoms include: intoxication.  This is a chronic problem. The current episode started more than 1 week ago. The problem has not changed since onset.Suspected agents include alcohol and cocaine (Marijuanna).   Chest Pain (Angina)   Associated symptoms include abdominal pain.   Abdominal Pain   Associated symptoms include chest pain.        Past Medical History   Diagnosis Date   ??? Pancreatitis chronic    ??? Seizure    ??? Alcoholism         Past Surgical History   Procedure Date   ??? Hx gi      EGD         No family history on file.     History     Social History   ??? Marital Status: SINGLE     Spouse Name: N/A     Number of Children: N/A   ??? Years of Education: N/A     Occupational History   ??? Not on file.     Social History Main Topics   ??? Smoking status: Not on file   ??? Smokeless tobacco: Not on file   ??? Alcohol Use:    ??? Drug Use:    ??? Sexually Active:      Other Topics Concern   ??? Not on file     Social History Narrative   ??? No narrative on file                  ALLERGIES: Morphine and Shellfish derived      Review of Systems   Constitutional: Negative.         Alcohol Intoxication.     HENT: Negative.    Eyes: Negative.    Respiratory: Negative.    Cardiovascular: Positive for chest pain.   Gastrointestinal: Positive for abdominal pain.   Genitourinary: Negative.    Musculoskeletal: Negative.     Skin: Negative.    Neurological: Negative.    Hematological: Negative.    Psychiatric/Behavioral: Negative.        Filed Vitals:    05/12/12 1927   BP: 118/85   Pulse: 102   Temp: 98.4 ??F (36.9 ??C)   Resp: 16   SpO2: 95%            Physical Exam   Nursing note and vitals reviewed.  Constitutional: He is oriented to person, place, and time. He appears well-developed and well-nourished. No distress.        Pt is intoxicated and crying in emotion when he refers to the death of his mother.     HENT:   Head: Normocephalic and atraumatic.   Eyes: Pupils are equal, round, and reactive to light.   Neck: Normal range of motion. Neck supple.   Cardiovascular: Normal rate and regular rhythm.    Pulmonary/Chest: Effort normal and  breath sounds normal. No respiratory distress. He has no wheezes. He has no rales.   Abdominal: Soft. Bowel sounds are normal. There is no tenderness. There is no rebound.   Genitourinary:        NE   Musculoskeletal: Normal range of motion.   Neurological: He is alert and oriented to person, place, and time.   Skin: Skin is warm and dry.        MDM    Procedures    PROGRESS NOTE: Pt states that he feels better emotionally.  He has a ride that is coming to pick him up and take him home.  The Pt is not driving.  He will be released to the custody of his ride.  Katherine Roan, NP    10:35 PM    Diagnosis:   1. Stress reaction, emotional    2. Alcohol intoxication    3. Alcohol abuse          Disposition:   Discharged to home.      Follow-up Information     Follow up With Details Comments Contact Info    [facility] in 1 day without fail [address]  [place] [address]  [phone]          Patient's Medications   Start Taking    No medications on file   Continue Taking    PHENYTOIN SODIUM EXTENDED (DILANTIN PO)    Take  by mouth.   These Medications have changed    No medications on file   Stop Taking    No medications on file

## 2012-05-12 NOTE — ED Provider Notes (Signed)
I was personally available for consultation in the emergency department.  I have not seen the patient but have reviewed the chart prior to discharge and agree with the documentation recorded by the MLP, including the assessment, treatment plan, and disposition.  Glema Takaki, DO

## 2012-05-12 NOTE — ED Notes (Signed)
I have reviewed discharge instructions with the patient.  The patient verbalized understanding.  Pt left via ambulatory.  Awaiting ride in [place].  Pt in stable condition.  Patient armband removed and shredded

## 2012-05-12 NOTE — ED Notes (Signed)
Urine obtained via clean catch and taken to lab.

## 2012-05-13 LAB — CBC WITH AUTOMATED DIFF
ABS. BASOPHILS: 0 10*3/uL (ref 0.0–0.06)
ABS. EOSINOPHILS: 0 10*3/uL (ref 0.0–0.4)
ABS. LYMPHOCYTES: 1.4 10*3/uL (ref 0.9–3.6)
ABS. MONOCYTES: 0.5 10*3/uL (ref 0.05–1.2)
ABS. NEUTROPHILS: 1.1 10*3/uL — ABNORMAL LOW (ref 1.8–8.0)
BASOPHILS: 1 % (ref 0–2)
EOSINOPHILS: 1 % (ref 0–5)
HCT: 36.9 % (ref 36.0–48.0)
HGB: 12.5 g/dL — ABNORMAL LOW (ref 13.0–16.0)
LYMPHOCYTES: 47 % (ref 21–52)
MCH: 31.3 PG (ref 24.0–34.0)
MCHC: 33.9 g/dL (ref 31.0–37.0)
MCV: 92.5 FL (ref 74.0–97.0)
MONOCYTES: 15 % — ABNORMAL HIGH (ref 3–10)
MPV: 8.6 FL — ABNORMAL LOW (ref 9.2–11.8)
NEUTROPHILS: 36 % — ABNORMAL LOW (ref 40–73)
PLATELET: 143 10*3/uL (ref 135–420)
RBC: 3.99 M/uL — ABNORMAL LOW (ref 4.70–5.50)
RDW: 12.8 % (ref 11.6–14.5)
WBC: 3 10*3/uL — ABNORMAL LOW (ref 4.6–13.2)

## 2012-05-13 LAB — HEPATIC FUNCTION PANEL
A-G Ratio: 0.9 (ref 0.8–1.7)
ALT (SGPT): 40 U/L (ref 12.0–78.0)
AST (SGOT): 72 U/L — ABNORMAL HIGH (ref 15–37)
Albumin: 2.8 g/dL — ABNORMAL LOW (ref 3.4–5.0)
Alk. phosphatase: 179 U/L — ABNORMAL HIGH (ref 50–136)
Bilirubin, direct: 0.1 MG/DL (ref 0.0–0.2)
Bilirubin, total: 0.3 MG/DL (ref 0.2–1.0)
Globulin: 3 g/dL (ref 2.0–4.0)
Protein, total: 5.8 g/dL — ABNORMAL LOW (ref 6.4–8.2)

## 2012-05-13 LAB — EKG, 12 LEAD, INITIAL
Atrial Rate: 100 {beats}/min
Calculated P Axis: 89 degrees
Calculated R Axis: 31 degrees
Calculated T Axis: 66 degrees
Diagnosis: NORMAL
P-R Interval: 154 ms
Q-T Interval: 358 ms
QRS Duration: 98 ms
QTC Calculation (Bezet): 461 ms
Ventricular Rate: 100 {beats}/min

## 2012-05-13 LAB — DRUG SCREEN, URINE
AMPHETAMINES: NEGATIVE
BARBITURATES: NEGATIVE
BENZODIAZEPINES: NEGATIVE
COCAINE: NEGATIVE
METHADONE: NEGATIVE
OPIATES: NEGATIVE
PCP(PHENCYCLIDINE): NEGATIVE
THC (TH-CANNABINOL): NEGATIVE

## 2012-05-13 LAB — METABOLIC PANEL, BASIC
Anion gap: 10 mmol/L (ref 3.0–18)
BUN/Creatinine ratio: 4 — ABNORMAL LOW (ref 12–20)
BUN: 3 MG/DL — ABNORMAL LOW (ref 7.0–18)
CO2: 27 MMOL/L (ref 21–32)
Calcium: 7.7 MG/DL — ABNORMAL LOW (ref 8.5–10.1)
Chloride: 105 MMOL/L (ref 100–108)
Creatinine: 0.7 MG/DL (ref 0.6–1.3)
GFR est AA: >60 mL/min/{1.73_m2} (ref >60)
GFR est non-AA: >60 mL/min/{1.73_m2} (ref >60)
Glucose: 94 MG/DL (ref 74–99)
Potassium: 3.8 MMOL/L (ref 3.5–5.5)
Sodium: 142 MMOL/L (ref 136–145)

## 2012-05-13 LAB — CARDIAC PANEL,(CK, CKMB & TROPONIN)
CK - MB: 0.9 ng/ml (ref 0.5–3.6)
CK-MB Index: 1.7 % (ref 0.0–4.0)
CK: 54 U/L (ref 39–308)
Troponin-I, QT: <0.02 NG/ML (ref 0.0–0.045)

## 2012-05-13 LAB — LIPASE: Lipase: 86 U/L (ref 73–393)

## 2012-05-13 LAB — ETHYL ALCOHOL: ALCOHOL(ETHYL),SERUM: 387 MG/DL — CR (ref 0–3)

## 2012-05-13 MED ADMIN — 0.9% sodium chloride infusion 1,000 mL: INTRAVENOUS | @ 01:00:00 | NDC 00409798309

## 2012-05-13 MED FILL — SODIUM CHLORIDE 0.9 % IV: INTRAVENOUS | Qty: 1000

## 2012-07-03 DIAGNOSIS — A4151 Sepsis due to Escherichia coli [E. coli]: Secondary | ICD-10-CM

## 2012-07-03 NOTE — ED Notes (Signed)
Seizure pad intact to bed rails.  Patient vomited a moderate amount of dark fluid.  Patient states that he feels "100% better after getting that all out"

## 2012-07-03 NOTE — ED Notes (Signed)
Patient has hiccoughs.  Patient has redness noted to right eye.  Patient has superficial abrasions to right elbow.

## 2012-07-03 NOTE — ED Notes (Signed)
Patient arrived covered in urine and feces.  Patient covered in dirt and leaves.  Patient wearing 4 shirts and shoes with no socks.  Patient very malodorous. Patient disrobed and given a full bath with assist of 2 staff members.  Patient placed in a gown and covered with a sheet.  Patient able to move all extremities and has good strength but is unsteady and cannot hold self in an upright sitting position. Patient states that he drank 1 large bottle of wine today.

## 2012-07-03 NOTE — ED Provider Notes (Addendum)
HPI Comments: Pt here for EMS call found down. Pt states he drank 1 bottle of wine today and had a seizure. He is incontinent of bowel and urine which is not unusual per his hx. Denies headache. Pt states he feels his 'ingestion and pancreatitis coming on'. He notes he has been absent of dilantin for 5 weeks.    Patient is a 51 y.o. male presenting with seizures. The history is provided by the patient and the EMS personnel.   Seizure            Past Medical History   Diagnosis Date   ??? Pancreatitis chronic    ??? Seizure    ??? Alcoholism         Past Surgical History   Procedure Date   ??? Hx gi      EGD         History reviewed. No pertinent family history.     History     Social History   ??? Marital Status: SINGLE     Spouse Name: N/A     Number of Children: N/A   ??? Years of Education: N/A     Occupational History   ??? Not on file.     Social History Main Topics   ??? Smoking status: Not on file   ??? Smokeless tobacco: Not on file   ??? Alcohol Use: Yes   ??? Drug Use:    ??? Sexually Active:      Other Topics Concern   ??? Not on file     Social History Narrative   ??? No narrative on file                  ALLERGIES: Morphine and Shellfish derived      Review of Systems   Constitutional: Negative.    HENT: Negative.    Eyes: Negative.    Respiratory: Negative.    Cardiovascular: Negative.    Gastrointestinal: Negative.    Genitourinary: Negative.    Musculoskeletal: Negative.    Skin: Negative.    Neurological: Negative.    Hematological: Negative.    Psychiatric/Behavioral: Negative.    All other systems reviewed and are negative.        There were no vitals filed for this visit.         Physical Exam   Nursing note and vitals reviewed.  Constitutional: He is oriented to person, place, and time. He appears well-developed and well-nourished.        Disheveled white male, smells of etoh, feces, ammonia/urine and body odor.  Moving around the bed jovially laughing with nursing staff   HENT:   Head: Normocephalic and atraumatic.    Right Ear: External ear normal.   Left Ear: External ear normal.   Eyes: Conjunctivae and EOM are normal. Pupils are equal, round, and reactive to light.   Neck: Normal range of motion. Neck supple.   Cardiovascular: Normal rate, regular rhythm, normal heart sounds and intact distal pulses.    Pulmonary/Chest: Effort normal and breath sounds normal.   Abdominal: Soft. Bowel sounds are normal. There is tenderness. There is no rebound and no guarding.        Epigastric tenderness   Musculoskeletal: Normal range of motion.   Neurological: He is alert and oriented to person, place, and time. No cranial nerve deficit.        No asterixis, nonfocal neuro exam  Horizontal nystagmus OU, ataxic on standing   Skin: Skin is  warm and dry.   Psychiatric: He has a normal mood and affect. His behavior is normal. Judgment and thought content normal.        MDM     Differential Diagnosis; Clinical Impression; Plan:     ETOH intoxication with seizure and incontinence. Will stratify, hydrate, cover electrolyntes and Tx sxs.    Alcoholic with gastritis n/v improving clinically. Subjectively his nausea is decreasing, plan for dispo home (homeless) and meds for n/v. Doubt compliance given lack with compliance with his seizure meds. Discussion of importance and offering for ETOH abuse. Pt aware.    Pt had episode of nausea/dry heaves phenergan ordered and pt became agitated. Suspected mixted extrapyramidal rxn vs ETOH/seizure d/o exacerbated by anxiety.    Consult with dr. Edilia Bo who consulted at bedside agrees management    Consult with dr. Gwen Her (hospitalist) agrees admission.    Repeat vitals with rectal temp elevated. Blood cultures added, pan cultures. Abx, antipyretics.  Amount and/or Complexity of Data Reviewed:   Clinical lab tests:  Reviewed and ordered  Tests in the radiology section of CPT??:  Ordered and reviewed  Tests in the medicine section of the CPT??:  Ordered and reviewed  Discussion of test results with the performing  providers:  Yes   Decide to obtain previous medical records or to obtain history from someone other than the patient:  Yes   Obtain history from someone other than the patient:  Yes   Review and summarize past medical records:  Yes   Discuss the patient with another provider:  Yes   Independant visualization of image, tracing, or specimen:  Yes  Critical Care:   Total time providing critical care:  75-105 minutes (Excluding all other billable procedures.)      Procedures    Diagnosis:   1. Seizure disorder    2. Alcohol abuse    3. Delirium tremens    4. SVT (supraventricular tachycardia)    5. Subdural hematoma, chronic          Disposition: admit -telemetry    Follow-up Information     None          Patient's Medications   Start Taking    No medications on file   Continue Taking    PHENYTOIN SODIUM EXTENDED (DILANTIN PO)    Take  by mouth.   These Medications have changed    No medications on file   Stop Taking    No medications on file

## 2012-07-04 ENCOUNTER — Inpatient Hospital Stay
Admit: 2012-07-04 | Discharge: 2012-07-10 | Disposition: A | Discharge status: Home Health | Payer: Self-pay | Attending: Internal Medicine | Admitting: Internal Medicine

## 2012-07-04 LAB — URINE MICROSCOPIC ONLY
RBC: 36 /HPF (ref 0–5)
WBC: 0 /HPF (ref 0–4)

## 2012-07-04 LAB — CBC WITH AUTOMATED DIFF
ABS. BASOPHILS: 0 10*3/uL (ref 0.0–0.1)
ABS. BASOPHILS: 0 10*3/uL (ref 0.0–0.06)
ABS. EOSINOPHILS: 0 10*3/uL (ref 0.0–0.4)
ABS. EOSINOPHILS: 0 10*3/uL (ref 0.0–0.4)
ABS. LYMPHOCYTES: 0.9 10*3/uL (ref 0.8–3.5)
ABS. LYMPHOCYTES: 0.5 10*3/uL — ABNORMAL LOW (ref 0.9–3.6)
ABS. MONOCYTES: 1.5 10*3/uL — ABNORMAL HIGH (ref 0–1.0)
ABS. MONOCYTES: 1.1 10*3/uL (ref 0.05–1.2)
ABS. NEUTROPHILS: 6.9 10*3/uL (ref 1.8–8.0)
ABS. NEUTROPHILS: 5.2 10*3/uL (ref 1.8–8.0)
BAND NEUTROPHILS: 1 % (ref 0–5)
BASOPHILS: 0 % (ref 0–3)
BASOPHILS: 0 % (ref 0–2)
EOSINOPHILS: 0 % (ref 0–5)
EOSINOPHILS: 0 % (ref 0–5)
HCT: 33.5 % — ABNORMAL LOW (ref 36.0–48.0)
HCT: 30.3 % — ABNORMAL LOW (ref 36.0–48.0)
HGB: 11.9 g/dL — ABNORMAL LOW (ref 13.0–16.0)
HGB: 10.8 g/dL — ABNORMAL LOW (ref 13.0–16.0)
LYMPHOCYTES: 10 % — ABNORMAL LOW (ref 20–51)
LYMPHOCYTES: 7 % — ABNORMAL LOW (ref 21–52)
MCH: 30.4 PG (ref 24.0–34.0)
MCH: 30.6 PG (ref 24.0–34.0)
MCHC: 35.5 g/dL (ref 31.0–37.0)
MCHC: 35.6 g/dL (ref 31.0–37.0)
MCV: 85.5 FL (ref 74.0–97.0)
MCV: 85.8 FL (ref 74.0–97.0)
MONOCYTES: 16 % — ABNORMAL HIGH (ref 2–9)
MONOCYTES: 16 % — ABNORMAL HIGH (ref 3–10)
MPV: 9.8 FL (ref 9.2–11.8)
MPV: 10.2 FL (ref 9.2–11.8)
NEUTROPHILS: 73 % (ref 42–75)
NEUTROPHILS: 77 % — ABNORMAL HIGH (ref 40–73)
PLATELET COMMENTS: DECREASED
PLATELET: 67 10*3/uL — ABNORMAL LOW (ref 135–420)
PLATELET: 54 10*3/uL — ABNORMAL LOW (ref 135–420)
RBC: 3.92 M/uL — ABNORMAL LOW (ref 4.70–5.50)
RBC: 3.53 M/uL — ABNORMAL LOW (ref 4.70–5.50)
RDW: 13.8 % (ref 11.6–14.5)
RDW: 13.9 % (ref 11.6–14.5)
WBC: 9.4 10*3/uL (ref 4.6–13.2)
WBC: 6.7 10*3/uL (ref 4.6–13.2)

## 2012-07-04 LAB — URINALYSIS W/ RFLX MICROSCOPIC
Bilirubin: NEGATIVE
Glucose: 500 MG/DL — AB
Ketone: NEGATIVE MG/DL
Leukocyte Esterase: NEGATIVE
Nitrites: NEGATIVE
Specific gravity: 1.02 (ref 1.003–1.030)
Urobilinogen: 1 EU/DL (ref 0.2–1.0)
pH (UA): 5.5 (ref 5.0–8.0)

## 2012-07-04 LAB — METABOLIC PANEL, BASIC
Anion gap: 20 mmol/L — ABNORMAL HIGH (ref 3.0–18)
Anion gap: 14 mmol/L (ref 3.0–18)
BUN/Creatinine ratio: 12 (ref 12–20)
BUN/Creatinine ratio: 22 — ABNORMAL HIGH (ref 12–20)
BUN: 11 MG/DL (ref 7.0–18)
BUN: 15 MG/DL (ref 7.0–18)
CO2: 25 MMOL/L (ref 21–32)
CO2: 28 MMOL/L (ref 21–32)
Calcium: 6.9 MG/DL — ABNORMAL LOW (ref 8.5–10.1)
Calcium: 6.8 MG/DL — ABNORMAL LOW (ref 8.5–10.1)
Chloride: 85 MMOL/L — ABNORMAL LOW (ref 100–108)
Chloride: 93 MMOL/L — ABNORMAL LOW (ref 100–108)
Creatinine: 0.89 MG/DL (ref 0.6–1.3)
Creatinine: 0.69 MG/DL (ref 0.6–1.3)
GFR est AA: >60 mL/min/{1.73_m2} (ref >60)
GFR est AA: >60 mL/min/{1.73_m2} (ref >60)
GFR est non-AA: >60 mL/min/{1.73_m2} (ref >60)
GFR est non-AA: >60 mL/min/{1.73_m2} (ref >60)
Glucose: 176 MG/DL — ABNORMAL HIGH (ref 74–99)
Glucose: 114 MG/DL — ABNORMAL HIGH (ref 74–99)
Potassium: 2.7 MMOL/L — CL (ref 3.5–5.5)
Potassium: 2.8 MMOL/L — CL (ref 3.5–5.5)
Sodium: 130 MMOL/L — ABNORMAL LOW (ref 136–145)
Sodium: 135 MMOL/L — ABNORMAL LOW (ref 136–145)

## 2012-07-04 LAB — HEPATIC FUNCTION PANEL
A-G Ratio: 0.7 — ABNORMAL LOW (ref 0.8–1.7)
ALT (SGPT): 61 U/L (ref 12.0–78.0)
AST (SGOT): 185 U/L — ABNORMAL HIGH (ref 15–37)
Albumin: 2.1 g/dL — ABNORMAL LOW (ref 3.4–5.0)
Alk. phosphatase: 325 U/L — ABNORMAL HIGH (ref 50–136)
Bilirubin, direct: 0.3 MG/DL — ABNORMAL HIGH (ref 0.0–0.2)
Bilirubin, total: 0.6 MG/DL (ref 0.2–1.0)
Globulin: 3.2 g/dL (ref 2.0–4.0)
Protein, total: 5.3 g/dL — ABNORMAL LOW (ref 6.4–8.2)

## 2012-07-04 LAB — PTT: aPTT: 28.7 s (ref 24.6–37.7)

## 2012-07-04 LAB — AMMONIA: Ammonia, plasma: 46 umol/L — ABNORMAL HIGH (ref 11–32)

## 2012-07-04 LAB — DRUG SCREEN, URINE
AMPHETAMINES: NEGATIVE
BARBITURATES: NEGATIVE
BENZODIAZEPINES: NEGATIVE
COCAINE: NEGATIVE
METHADONE: NEGATIVE
OPIATES: NEGATIVE
PCP(PHENCYCLIDINE): NEGATIVE
THC (TH-CANNABINOL): NEGATIVE

## 2012-07-04 LAB — CARDIAC PANEL,(CK, CKMB & TROPONIN)
CK - MB: 1.1 ng/ml (ref 0.5–3.6)
CK - MB: 3.8 ng/ml — ABNORMAL HIGH (ref 0.5–3.6)
CK - MB: 2.2 ng/ml (ref 0.5–3.6)
CK-MB Index: 1.4 % (ref 0.0–4.0)
CK-MB Index: 1 % (ref 0.0–4.0)
CK-MB Index: 0.6 % (ref 0.0–4.0)
CK: 80 U/L (ref 39–308)
CK: 379 U/L — ABNORMAL HIGH (ref 39–308)
CK: 392 U/L — ABNORMAL HIGH (ref 39–308)
Troponin-I, QT: <0.02 NG/ML (ref 0.0–0.045)
Troponin-I, QT: 0.02 NG/ML (ref 0.0–0.045)
Troponin-I, QT: 0.02 NG/ML (ref 0.0–0.045)

## 2012-07-04 LAB — OCCULT BLOOD, STOOL: Occult blood, stool: POSITIVE — AB

## 2012-07-04 LAB — EKG, 12 LEAD, INITIAL
Atrial Rate: 113 {beats}/min
Calculated P Axis: 70 degrees
Calculated R Axis: 80 degrees
Calculated T Axis: 71 degrees
P-R Interval: 158 ms
Q-T Interval: 350 ms
QRS Duration: 98 ms
QTC Calculation (Bezet): 480 ms
Ventricular Rate: 113 {beats}/min

## 2012-07-04 LAB — EKG, 12 LEAD, SUBSEQUENT
Atrial Rate: 150 {beats}/min
Calculated R Axis: 59 degrees
Calculated T Axis: 76 degrees
Q-T Interval: 344 ms
QRS Duration: 84 ms
QTC Calculation (Bezet): 541 ms
Ventricular Rate: 149 {beats}/min

## 2012-07-04 LAB — PROTHROMBIN TIME + INR
INR: 0.9 (ref 0.8–1.2)
Prothrombin time: 12 s (ref 11.5–15.2)

## 2012-07-04 LAB — VITAMIN B12: Vitamin B12: 689 pg/mL (ref 211–911)

## 2012-07-04 LAB — CALCIUM, IONIZED: Ionized Calcium: 0.89 MMOL/L — ABNORMAL LOW (ref 1.12–1.32)

## 2012-07-04 LAB — PHOSPHORUS: Phosphorus: 2.1 MG/DL — ABNORMAL LOW (ref 2.5–4.9)

## 2012-07-04 LAB — MAGNESIUM: Magnesium: 2.3 MG/DL (ref 1.8–2.4)

## 2012-07-04 LAB — TSH 3RD GENERATION: TSH: 0.89 u[IU]/mL (ref 0.51–6.27)

## 2012-07-04 LAB — LACTIC ACID: Lactic acid: 4.6 MMOL/L — ABNORMAL HIGH (ref 0.4–2.0)

## 2012-07-04 LAB — ETHYL ALCOHOL: ALCOHOL(ETHYL),SERUM: 132 MG/DL — ABNORMAL HIGH (ref 0–3)

## 2012-07-04 LAB — PHENYTOIN: Phenytoin: 0.6 ug/mL — ABNORMAL LOW (ref 10.0–20.0)

## 2012-07-04 LAB — LIPASE: Lipase: 45 U/L — ABNORMAL LOW (ref 73–393)

## 2012-07-04 MED ADMIN — piperacillin-tazobactam (ZOSYN) 3.375 g in 0.9% sodium chloride (MBP/ADV) 100 mL MBP: INTRAVENOUS | @ 17:00:00 | NDC 00338055318

## 2012-07-04 MED ADMIN — phenytoin (DILANTIN) injection 500 mg: INTRAVENOUS | @ 05:00:00 | NDC 00641255545

## 2012-07-04 MED ADMIN — phenytoin (DILANTIN) 100 mg in 0.9% sodium chloride 50 mL IVPB: INTRAVENOUS | @ 23:00:00 | NDC 00641049321

## 2012-07-04 MED ADMIN — 0.9% sodium chloride infusion 1,000 mL: INTRAVENOUS | @ 12:00:00 | NDC 00338004904

## 2012-07-04 MED ADMIN — piperacillin-tazobactam (ZOSYN) 3.375 gram injection: @ 11:00:00 | NDC 81284015110

## 2012-07-04 MED ADMIN — acetaminophen (TYLENOL) suppository 650 mg: RECTAL | @ 10:00:00 | NDC 00713016506

## 2012-07-04 MED ADMIN — phenytoin (DILANTIN) 50 mg/mL injection: @ 16:00:00 | NDC 54569207600

## 2012-07-04 MED ADMIN — diphenhydrAMINE (BENADRYL) injection 25 mg: INTRAVENOUS | @ 06:00:00 | NDC 63323066401

## 2012-07-04 MED ADMIN — Potassium & Sodium Phosphates (NEUTRA-PHOS) packet 2 Packet: ORAL | @ 23:00:00

## 2012-07-04 MED ADMIN — LORazepam (ATIVAN) 2 mg/mL injection: @ 07:00:00 | NDC 11098010101

## 2012-07-04 MED ADMIN — dextrose 5% - 0.9% NaCl with KCl 20 mEq/L infusion: INTRAVENOUS | @ 14:00:00 | NDC 00338080304

## 2012-07-04 MED ADMIN — piperacillin-tazobactam (ZOSYN) 3.375 g in 0.9% sodium chloride (MBP/ADV) 100 mL MBP: INTRAVENOUS | @ 10:00:00 | NDC 00338055318

## 2012-07-04 MED ADMIN — adenosine (ADENOCARD) injection 6 mg: INTRAVENOUS | @ 09:00:00 | NDC 63323065104

## 2012-07-04 MED ADMIN — 0.9% sodium chloride infusion 1,000 mL: INTRAVENOUS | @ 02:00:00 | NDC 00409798309

## 2012-07-04 MED ADMIN — phenytoin (DILANTIN) injection 100 mg: INTRAVENOUS | @ 13:00:00 | NDC 54569207600

## 2012-07-04 MED ADMIN — potassium chloride 10 mEq in 100 ml IVPB: INTRAVENOUS | @ 15:00:00 | NDC 00338070948

## 2012-07-04 MED ADMIN — adenosine (ADENOCARD) injection 12 mg: INTRAVENOUS | @ 09:00:00 | NDC 63323065104

## 2012-07-04 MED ADMIN — LORazepam (ATIVAN) injection 1 mg: INTRAVENOUS | @ 06:00:00 | NDC 00641604401

## 2012-07-04 MED ADMIN — 0.9% sodium chloride 1,000 mL with thiamine 100 mg, folic acid 1 mg, magnesium sulfate 1 g infusion: INTRAVENOUS | @ 02:00:00 | NDC 99999323202

## 2012-07-04 MED ADMIN — chlordiazePOXIDE (LIBRIUM) capsule 10 mg: ORAL | @ 17:00:00 | NDC 51079037401

## 2012-07-04 MED ADMIN — potassium chloride 10 mEq in 100 ml IVPB: INTRAVENOUS | @ 17:00:00 | NDC 00338070948

## 2012-07-04 MED ADMIN — 0.9% sodium chloride infusion 1,000 mL: INTRAVENOUS | @ 10:00:00 | NDC 00338004904

## 2012-07-04 MED ADMIN — 0.9% sodium chloride 1,000 mL with mvi, pedi no.2 with vit K 5 mL, thiamine 100 mg, folic acid 1 mg infusion: INTRAVENOUS | @ 18:00:00 | NDC 99999323202

## 2012-07-04 MED ADMIN — 0.9% sodium chloride (MBP/ADV) 0.9 % infusion: @ 11:00:00 | NDC 87701099893

## 2012-07-04 MED ADMIN — 0.9% sodium chloride infusion: INTRAVENOUS | @ 10:00:00 | NDC 87701099893

## 2012-07-04 MED ADMIN — adenosine (ADENOCARD) 3 mg/mL injection: @ 09:00:00 | NDC 72078003302

## 2012-07-04 MED ADMIN — calcium gluconate 4 g in 0.9% sodium chloride 100 mL IVPB: INTRAVENOUS | @ 18:00:00 | NDC 63323031110

## 2012-07-04 MED ADMIN — levETIRAcetam (KEPPRA) 500 mg in 0.9% sodium chloride (MBP/ADV) 100 mL MBP: INTRAVENOUS | @ 14:00:00 | NDC 39822400001

## 2012-07-04 MED ADMIN — ondansetron (ZOFRAN) injection 4 mg: INTRAVENOUS | @ 03:00:00 | NDC 00069134005

## 2012-07-04 MED ADMIN — pantoprazole (PROTONIX) injection 40 mg: INTRAVENOUS | @ 14:00:00 | NDC 00008400101

## 2012-07-04 MED ADMIN — phenytoin (DILANTIN) 100 mg in 0.9% sodium chloride 50 mL IVPB: INTRAVENOUS | @ 16:00:00 | NDC 00641255541

## 2012-07-04 MED ADMIN — promethazine (PHENERGAN) injection 25 mg: INTRAVENOUS | @ 06:00:00 | NDC 00641092821

## 2012-07-04 MED ADMIN — PHARMACY INFORMATION NOTE - Calcium and Phosphorus Levels: @ 15:00:00 | NDC 00740100004

## 2012-07-04 MED ADMIN — piperacillin-tazobactam (ZOSYN) 3.375 g in 0.9% sodium chloride (MBP/ADV) 100 mL MBP: INTRAVENOUS | @ 23:00:00 | NDC 00338055318

## 2012-07-04 MED ADMIN — promethazine (PHENERGAN) 25 mg/mL injection: @ 06:00:00 | NDC 60977000143

## 2012-07-04 MED ADMIN — magnesium sulfate 2 g/50 ml IVPB (premix or compounded): INTRAVENOUS | @ 09:00:00 | NDC 00409672924

## 2012-07-04 MED ADMIN — diphenhydrAMINE (BENADRYL) 50 mg/mL injection: @ 07:00:00 | NDC 57866728801

## 2012-07-04 MED ADMIN — potassium chloride 10 mEq in 100 ml IVPB: INTRAVENOUS | @ 14:00:00 | NDC 00338070948

## 2012-07-04 MED ADMIN — potassium chloride 10 mEq in 100 ml IVPB: INTRAVENOUS | @ 02:00:00 | NDC 00338070948

## 2012-07-04 MED ADMIN — vancomycin (VANCOCIN) 1,000 mg in 0.9% sodium chloride (MBP/ADV) 250 mL adv: INTRAVENOUS | @ 10:00:00 | NDC 72611076510

## 2012-07-04 MED ADMIN — acetaminophen (TYLENOL) 650 mg suppository: @ 10:00:00 | NDC 87701094618

## 2012-07-04 MED ADMIN — Potassium & Sodium Phosphates (NEUTRA-PHOS) packet 2 Packet: ORAL | @ 20:00:00

## 2012-07-04 MED ADMIN — acetaminophen (TYLENOL) tablet 975 mg: ORAL | @ 10:00:00 | NDC 50580048790

## 2012-07-04 MED ADMIN — chlordiazePOXIDE (LIBRIUM) capsule 10 mg: ORAL | @ 15:00:00 | NDC 51079037401

## 2012-07-04 MED ADMIN — vancomycin (VANCOCIN) 1,250 mg in 0.9% sodium chloride 250 mL IVPB: INTRAVENOUS | @ 18:00:00 | NDC 00409798302

## 2012-07-04 MED ADMIN — metoprolol (LOPRESSOR) tablet 25 mg: ORAL | @ 14:00:00 | NDC 51079025501

## 2012-07-04 MED ADMIN — metoprolol (LOPRESSOR) tablet 25 mg: ORAL | @ 23:00:00 | NDC 51079025501

## 2012-07-04 MED ADMIN — Potassium & Sodium Phosphates (NEUTRA-PHOS) packet 2 Packet: ORAL | @ 17:00:00

## 2012-07-04 MED ADMIN — chlordiazePOXIDE (LIBRIUM) capsule 10 mg: ORAL | @ 23:00:00 | NDC 51079037401

## 2012-07-04 MED ADMIN — sodium chloride (NS) flush 5-10 mL: INTRAVENOUS | @ 18:00:00 | NDC 87701099893

## 2012-07-04 MED ADMIN — magnesium sulfate 2 g/50 ml 2 gram/50 mL (4 %) IVPB premix PgBk: @ 10:00:00 | NDC 10361307501

## 2012-07-04 MED FILL — PHOS-NAK 280 MG-160 MG-250 MG ORAL POWDER PACKET: 280-160-250 mg | ORAL | Qty: 2

## 2012-07-04 MED FILL — PHENYTOIN SODIUM 50 MG/ML IV: 50 mg/mL | INTRAVENOUS | Qty: 2

## 2012-07-04 MED FILL — CHLORDIAZEPOXIDE 5 MG CAP: 5 mg | ORAL | Qty: 2

## 2012-07-04 MED FILL — ACEPHEN 650 MG RECTAL SUPPOSITORY: 650 mg | RECTAL | Qty: 1

## 2012-07-04 MED FILL — ADENOSINE 3 MG/ML IV SOLN: 3 mg/mL | INTRAVENOUS | Qty: 4

## 2012-07-04 MED FILL — PHENYTOIN SODIUM 50 MG/ML IV: 50 mg/mL | INTRAVENOUS | Qty: 10

## 2012-07-04 MED FILL — SODIUM CHLORIDE 0.9 % IV: INTRAVENOUS | Qty: 1000

## 2012-07-04 MED FILL — ELECTROLYTE REPLACEMENT PROTOCOL: Qty: 1

## 2012-07-04 MED FILL — LORAZEPAM 2 MG/ML IJ SOLN: 2 mg/mL | INTRAMUSCULAR | Qty: 1

## 2012-07-04 MED FILL — METOPROLOL TARTRATE 25 MG TAB: 25 mg | ORAL | Qty: 1

## 2012-07-04 MED FILL — ZOSYN 3.375 GRAM INTRAVENOUS SOLUTION: 3.375 gram | INTRAVENOUS | Qty: 3.38

## 2012-07-04 MED FILL — POTASSIUM CHLORIDE 10 MEQ/100 ML IV PIGGY BACK: 10 mEq/0 mL | INTRAVENOUS | Qty: 100

## 2012-07-04 MED FILL — PROTONIX 40 MG INTRAVENOUS SOLUTION: 40 mg | INTRAVENOUS | Qty: 40

## 2012-07-04 MED FILL — MAGNESIUM SULFATE 2 GRAM/50 ML IVPB: 2 gram/50 mL (4 %) | INTRAVENOUS | Qty: 50

## 2012-07-04 MED FILL — LEVETIRACETAM 500 MG/5 ML IV SOLN: 500 mg/5 mL | INTRAVENOUS | Qty: 10

## 2012-07-04 MED FILL — DIPHENHYDRAMINE HCL 50 MG/ML IJ SOLN: 50 mg/mL | INTRAMUSCULAR | Qty: 1

## 2012-07-04 MED FILL — SODIUM CHLORIDE 0.9 % IV PIGGY BACK: INTRAVENOUS | Qty: 100

## 2012-07-04 MED FILL — PROMETHAZINE 25 MG/ML INJECTION: 25 mg/mL | INTRAMUSCULAR | Qty: 1

## 2012-07-04 MED FILL — ONDANSETRON (PF) 4 MG/2 ML INJECTION: 4 mg/2 mL | INTRAMUSCULAR | Qty: 2

## 2012-07-04 MED FILL — CALCIUM GLUCONATE 100 MG/ML (10%) IV SOLN: 100 mg/mL (10%) | INTRAVENOUS | Qty: 40

## 2012-07-04 MED FILL — D5-NS WITH POTASSIUM CHLORIDE 20 MEQ/L IV: 20 mEq/L | INTRAVENOUS | Qty: 1000

## 2012-07-04 MED FILL — BD POSIFLUSH NORMAL SALINE 0.9 % INJECTION SYRINGE: INTRAMUSCULAR | Qty: 10

## 2012-07-04 MED FILL — PHARMACY VANCOMYCIN NOTE: Qty: 1

## 2012-07-04 MED FILL — VANCOMYCIN 10 GRAM IV SOLR: 10 gram | INTRAVENOUS | Qty: 1250

## 2012-07-04 MED FILL — MAGNESIUM SULFATE 50 % (4 MEQ/ML) INJECTION: 4 mEq/mL (50 %) | INTRAMUSCULAR | Qty: 4

## 2012-07-04 MED FILL — PHARMACY INFORMATION NOTE: Qty: 1

## 2012-07-04 NOTE — Progress Notes (Signed)
2956- Pt transferred from ER gurney to hospital bed under own strength. Pt situated in bed. BP stable, heart rate elevated in the 130's-140's, however, pt is asymptomatic. Sitter at bedside with pt. Pt is alert but has periods of confusion. Pt very impulsive and needs a lot of redirection to remain clam and in bed.   0930- Pt has large watery BM. Black in color with what appears to be large tarry pieces. Pt denies any abdominal pain at this time. Sample obtained for occult blood and sent to lab.  1045- Pt has another large black BM. Continues to have what appears to be tarry pieces in stool. Further samples obtained and sent to lab for workup.  1100- FMS placed at this time, pt tolerated procedure well. Will continue to monitor.   1200- Pt resting comfortably at this time. BP WNL, pulse rate continues to be elevated in 120's-130's.  1245- SLP in room to perform swallow eval.   1400- PT in room to perform eval. Pt up to side of bed, but felt weak and dizzy and requested to stop therapy. Pt placed back in bed. Will continue to monitor.

## 2012-07-04 NOTE — Other (Addendum)
50- Received report from the ER at this time.   1610- Pt arrived on floor at this time.     TRANSFER - OUT REPORT:    Verbal report given to [place], Charity fundraiser (name) on Jeremy Walters  being transferred to 2100 (unit) for routine progression of care       Report consisted of patient???s Situation, Background, Assessment and   Recommendations(SBAR).     Information from the following report(s) SBAR, Kardex, ED Summary, Intake/Output and MAR was reviewed with the receiving nurse.    Opportunity for questions and clarification was provided.

## 2012-07-04 NOTE — Consults (Signed)
Neurology Consultation    Patient: Jeremy Walters MRN: [id number]  SSN: [id number]    Date of Birth: Apr 05, 1961  Age: 51 y.o.  Sex: male      Subjective:      Jeremy Walters is a 51 y.o. male alcoholic with known seizure disorder, who was brought to the ER yesterday after being found down by EMS, then having a seizure.  The patient is supposed to be taking Dilantin, but has a h/o medical non compliance, and Dilantin level on admission was 0.6.  He was loaded with Dilantin and has been started on Dilantin 100 mg IV tid, plus Keppra 500 mg IV bid.      Lab work shows multiple derangements, including anemia, platelet count of 54, hematuria, hypokalemia, hypocalcemia, mild hypophosphatemia, lactate of 4.6, abnormal LFTs, NH3 of 46, CK of 379.  Vit B12 was normal at 689, TSH 0.89.  UDS negative.  Etoh level was 132. Hemoccult positive.    The patient tells me that he cannot afford Dilantin, and had not been taking it for about a month.  I note that there is a warrant for the patient's arrest, and he will be discharged from the hospital to the care of the police.    The patient does not complain of headache at present.    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 There is no evidence of acute blood in the hygroma, which was new compared to a CT done in 4/12. Chronic generalized atrophy was present.      Past Medical History   Diagnosis Date   ??? Pancreatitis chronic    ??? Seizure    ??? Alcoholism      History reviewed. No pertinent family history.  History   Substance Use Topics   ??? Smoking status: Not on file   ??? Smokeless tobacco: Not on file   ??? Alcohol Use: Yes      Current Facility-Administered Medications   Medication Dose Route Frequency Provider Last Rate Last Dose   ??? promethazine (PHENERGAN) injection 25 mg  25 mg IntraVENous NOW Lovie Macadamia, PA   25 mg at 07/04/12 0157   ??? diphenhydrAMINE (BENADRYL) injection 25 mg  25 mg IntraVENous NOW Lovie Macadamia, PA   25 mg at 07/04/12 0224   ??? LORazepam (ATIVAN) injection 1 mg  1 mg IntraVENous NOW Lovie Macadamia, PA   1 mg at 07/04/12 1308   ??? adenosine (ADENOCARD) injection 6 mg  6 mg IntraVENous NOW Lovie Macadamia, PA   6 mg at 07/04/12 0455   ??? adenosine (ADENOCARD) injection 6 mg  6 mg IntraVENous NOW Lovie Macadamia, PA   6 mg at 07/04/12 0456   ??? adenosine (ADENOCARD) injection 12 mg  12 mg IntraVENous NOW Lovie Macadamia, PA   12 mg at 07/04/12 0457   ??? 0.9% sodium chloride 1,000 mL with mvi, pedi no.2 with vit K 5 mL, thiamine 100 mg, folic acid 1 mg infusion   IntraVENous DAILY Nurudeen S Lawani, DO       ??? dextrose 5% - 0.9% NaCl with KCl 20 mEq/L infusion  150 mL/hr IntraVENous CONTINUOUS Nurudeen S Lawani, DO 150 mL/hr at 07/04/12 0959 150 mL/hr at 07/04/12 0959   ??? potassium chloride 10 mEq in 100 ml IVPB  10 mEq IntraVENous Q1H Nurudeen S Lawani, DO 100 mL/hr at 07/04/12 1326 10 mEq at 07/04/12 1326   ???  metoprolol (LOPRESSOR) tablet 25 mg  25 mg Oral BID Nurudeen S Lawani, DO   25 mg at 07/04/12 0959   ??? levETIRAcetam (KEPPRA) 500 mg in 0.9% sodium chloride (MBP/ADV) 100 mL MBP  500 mg IntraVENous Q12H Nurudeen S Lawani, DO   500 mg at 07/04/12 1610   ??? pantoprazole (PROTONIX) injection 40 mg  40 mg IntraVENous Q24H Nurudeen S Lawani, DO   40 mg at 07/04/12 0957   ??? ondansetron (ZOFRAN) injection 4 mg  4 mg IntraVENous Q4H PRN Nurudeen S Lawani, DO       ??? LORazepam (ATIVAN) injection 1 mg  1 mg IntraVENous Q15MIN PRN Nurudeen S Lawani, DO       ??? magnesium sulfate 2 g/50 ml IVPB (premix or compounded)  2 g IntraVENous ONCE Nurudeen S Lawani, DO   2 g at 07/04/12 0515   ??? piperacillin-tazobactam (ZOSYN) 3.375 g in 0.9% sodium chloride (MBP/ADV) 100 mL MBP  3.375 g IntraVENous NOW Lovie Macadamia, PA       ??? piperacillin-tazobactam (ZOSYN) 3.375 g in 0.9% sodium chloride (MBP/ADV) 100 mL MBP  3.375 g IntraVENous Q6H Nurudeen S Lawani, DO   3.375 g at 07/04/12 1252   ??? acetaminophen (TYLENOL) suppository 650 mg   650 mg Rectal Q6H PRN Lovie Macadamia, PA   650 mg at 07/04/12 0539   ??? 0.9% sodium chloride infusion 1,000 mL  1,000 mL IntraVENous ONCE Lovie Macadamia, PA   1,000 mL at 07/04/12 0559   ??? 0.9% sodium chloride infusion  150 mL/hr IntraVENous CONTINUOUS Lovie Macadamia, PA       ??? promethazine (PHENERGAN) 25 mg/mL injection            ??? LORazepam (ATIVAN) 2 mg/mL injection            ??? diphenhydrAMINE (BENADRYL) 50 mg/mL injection            ??? adenosine (ADENOCARD) 3 mg/mL injection            ??? magnesium sulfate 2 g/50 ml 2 gram/50 mL (4 %) IVPB premix PgBk            ??? acetaminophen (TYLENOL) 650 mg suppository            ??? piperacillin-tazobactam (ZOSYN) 3.375 gram injection            ??? 0.9% sodium chloride (MBP/ADV) 0.9 % infusion            ??? phenytoin (DILANTIN) 100 mg in 0.9% sodium chloride 50 mL IVPB  100 mg IntraVENous Q8H Nurudeen S Lawani, DO 50 mL/hr at 07/04/12 1133 100 mg at 07/04/12 1133   ??? sodium chloride (NS) flush 5-10 mL  5-10 mL IntraVENous Q8H Jairo Ben, MD   10 mL at 07/04/12 1400   ??? sodium chloride (NS) flush 5-10 mL  5-10 mL IntraVENous PRN Jairo Ben, MD       ??? ELECTROLYTE REPLACEMENT PROTOCOL  1 Each Other PRN Jairo Ben, MD       ??? ELECTROLYTE REPLACEMENT PROTOCOL  1 Each Other PRN Jairo Ben, MD       ??? ELECTROLYTE REPLACEMENT PROTOCOL  1 Each Other PRN Jairo Ben, MD       ??? ELECTROLYTE REPLACEMENT PROTOCOL  1 Each Other PRN Jairo Ben, MD       ??? haloperidol lactate (HALDOL) injection 1 mg  1 mg IntraVENous Q2H PRN Jairo Ben, MD       ??? LORazepam (  ATIVAN) injection 2 mg  2 mg IntraVENous Q15MIN PRN Jairo Ben, MD       ??? LORazepam (ATIVAN) tablet 1-2 mg  1-2 mg Oral Q1H PRN Jairo Ben, MD       ??? LORazepam (ATIVAN) tablet 3-4 mg  3-4 mg Oral Q1H PRN Jairo Ben, MD       ??? chlordiazePOXIDE (LIBRIUM) capsule 10 mg  10 mg Oral QID Jairo Ben, MD   10 mg at 07/04/12 1327   ??? vancomycin (VANCOCIN) 1,250 mg in 0.9% sodium  chloride 250 mL IVPB  1,250 mg IntraVENous ONCE Nurudeen S Lawani, DO   1,250 mg at 07/04/12 1358   ??? PHARMACY INFORMATION NOTE - Calcium and Phosphorus Levels  1 Each Other ONCE Nurudeen S Lawani, DO   1 Each at 07/04/12 1100   ??? vancomycin (VANCOCIN) 1,000 mg in 0.9% sodium chloride (MBP/ADV) 250 mL adv  1,000 mg IntraVENous Q18H Nurudeen S Lawani, DO       ??? VANCOMYCIN INFORMATION NOTE   Other ONCE Nurudeen S Lawani, DO       ??? phenytoin (DILANTIN) 50 mg/mL injection            ??? Potassium & Sodium Phosphates (NEUTRA-PHOS) packet 2 Packet  2 Packet Oral Q2H Jairo Ben, MD   2 Packet at 07/04/12 1327   ??? calcium gluconate 4 g in 0.9% sodium chloride 100 mL IVPB  4 g IntraVENous ONCE Jairo Ben, MD   4 g at 07/04/12 1331   ??? ondansetron (ZOFRAN) injection 4 mg  4 mg IntraVENous NOW Lovie Macadamia, PA   4 mg at 07/03/12 2231   ??? 0.9% sodium chloride 1,000 mL with thiamine 100 mg, folic acid 1 mg, magnesium sulfate 1 g infusion   IntraVENous ONCE Lovie Macadamia, [place]       ??? magnesium sulfate 4 mEq/mL (50 %) injection            ??? potassium chloride 10 mEq in 100 ml IVPB  10 mEq IntraVENous NOW Lovie Macadamia, PA   10 mEq at 07/03/12 2205   ??? ondansetron (ZOFRAN) injection 4 mg  4 mg IntraVENous NOW Lovie Macadamia, PA   4 mg at 07/03/12 2314   ??? phenytoin (DILANTIN) injection 500 mg  500 mg IntraVENous NOW Lovie Macadamia, PA   500 mg at 07/04/12 4540        Allergies   Allergen Reactions   ??? Morphine Rash and Nausea and Vomiting   ??? Shellfish Derived Rash and Nausea and Vomiting       Review of Systems:  Pertinent items are noted in the History of Present Illness.    Objective:     Filed Vitals:    07/04/12 1100 07/04/12 1200 07/04/12 1300 07/04/12 1400   BP: 123/80 115/78 111/76 115/81   Pulse: 127 101 99 96   Temp:  99.2 ??F (37.3 ??C)     Resp: 24 23 20 26    Height:       Weight:       SpO2: 99% 99% 98% 100%        Physical Exam:      General appearance was that of a dishevelled male in no acute distress with  normal mental status and speech.  Oriented to place, date.    HEENT:  Atraumatic, normocephalic.    Neck: Supple. No meningismus. No carotid bruits.    Heart: Regular rate and rhythm, no murmur.  Extremities:  No cyanosis, clubbing.  Mild pedal edema.      Cranial nerves II-XII were intact.    Motor exam:  No focal weakness. Tone normal.  No tremor or asterixis noted.    Cerebellar:  Finger to nose  testing was normal.  Rapid repetitive movements were mildly slow.    Sensory:  Perception of light touch was intact.      Gait: Not tested.      Assessment:     Hospital Problems  Never Reviewed        Codes Class Noted POA    *Alcohol withdrawal seizure 291.81  07/04/2012 Yes        Seizure 780.39  Unknown Yes    Overview    Signed 07/04/2012  9:20 AM by Jairo Ben, MD     keppra, dilantin, neurology           Thrombocytopenia, unspecified 287.5  07/04/2012 Yes    Overview    Addendum 07/04/2012  9:19 AM by Jairo Ben, MD     Due to alcoholic liver disease   Monitor           SVT (supraventricular tachycardia) 427.89  07/04/2012 Yes    Overview    Addendum 07/04/2012  9:46 AM by Jairo Ben, MD     Possibly due to Abnormal electrolyte,   cardiac enzymes negative echo BB          Subdural hematoma, 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 No evidence of acute hemorrhage or additional acute intracranial abnormality."            DTs (delirium tremens) 291.0  07/04/2012 Yes    Overview    Addendum 07/04/2012  9:43 AM by Jairo Ben, MD     Alcohol withdrawal protocol   UDS 07/04/12 negative           Melena 578.1  07/04/2012 Yes    Overview    Addendum 07/04/2012 10:59 AM by Jairo Ben, MD     Guaiac stool pending   Stool studies           Anemia, unspecified 285.9  07/04/2012 Yes    Overview    Signed 07/04/2012  9:39 AM by Jairo Ben,  MD     Anemia profile           SIRS (systemic inflammatory response syndrome) 995.90  07/04/2012 Yes    Overview    Addendum 07/04/2012  9:47 AM by Jairo Ben, MD     Lactate 4.6  BCx 07/04/12 x 2  Fever tachycardic tachypnea   Vancomycin/zosyn          Electrolyte abnormality 276.9  07/04/2012 Yes    Overview    Signed 07/04/2012  9:47 AM by Jairo Ben, MD     Hypokalemia hypophosphatemia                 Plan:     IMP:      1.  Seizure disorder.  The patient had alcohol on board on admission, and had an observed seizure, so these are not alcohol withdrawal seizures.  The patient has been re loaded with Dilantin, and would check a level in the AM, continue 500 mg/kg per day maintenance dose.  The patient has a long h/o medical non  compliance.  He will not be able to afford Keppra, so I will stop that.  Phenobarbital is the only other inexpensive AED, and I would be afraid that he could easily overdose himself with that medication.  Therefore, I recommend continuing Dilantin.  I have no objection to transfer of the patient to the floor with seizure precautions, and he should be placed in a room that can be monitored from the nurses' station due to the risk of alcohol withdrawal syndrome.    2.  Alcohol abuse. Long history of this, and likely little to be done about it.  Patient already getting thiamine 100 mg qd in MVI bag.     I appreciate the consultation.    Signed By: Frutoso Chase, MD     July 04, 2012

## 2012-07-04 NOTE — Progress Notes (Addendum)
Writer assisting [place] B. RN ([place]) with patient care. See doc flowsheet for recheck BP manually and temp 100.1 orally.

## 2012-07-04 NOTE — H&P (Addendum)
Pt seen and examined.  H and P dictated.  # [id number].  Consulted neuro for seizure and spoke with Dr. Pia Mau.

## 2012-07-04 NOTE — Other (Signed)
Bedside shift change report given to [place], RN (oncoming nurse) by [place], RN (offgoing nurse).  Report given with SBAR, Kardex, MAR and Recent Results.

## 2012-07-04 NOTE — H&P (Signed)
[facility]                  [address] [place], [place]  [address]                            HISTORY AND PHYSICAL REPORT    PATIENT:     Jeremy Walters, Jeremy Walters  CSN:             [id number]    ADMITTED:  07/03/2012  MRN:             [id number]     LOCATION:  ATTENDING:   Fredrik Cove, DO  DICTATING:   Fredrik Cove, DO      PRIMARY CARE PHYSICIAN: Unknown.    CHIEF COMPLAINT: Ethanol intoxication.    HISTORY OF PRESENT ILLNESS: The patient is a 51 year old male with past  medical history significant for history of pancreatitis in the past,  history of pancreatic pseudocyst status post drainage in the past,  hypertension, chronic ethanol use, history of seizure disorder, who  presents to the emergency department of [facility] after alcohol  intoxication. Apparently, the patient was found down by EMS after drinking  a bottle of wine, and subsequently had a seizure and became incontinent of  urine and feces. The patient was obtunded and very incoherent and was  unable to provide me with any history, and most of the history was obtained  from discussion with the ER physician and nurses, and extensive review of  entire medical records. On arrival at [facility], the patient was noted to be  covered in dirt and leaves, and had a mild odorous smell and also vomited  in the emergency department. Workup at [facility] included a CT of the head  which revealed chronic subdural hematoma and electrolyte derangements  including hypokalemia, hyponatremia, subtherapeutic Dilantin levels, and  clinical evidence of dehydration. The hospitalist service was consulted to  admit this patient to the hospital. The patient also met criteria for  systemic inflammatory response syndrome, as the patient had a fever with  temperature of 101.8 and was tachycardic, ranging between 110 to 160, and  also tachypneic.    PAST MEDICAL HISTORY  1. History of chronic ethanol abuse.  2. History of  pancreatitis in the past.  3. History of pancreatic pseudocyst, status post drainage in the past.  4. Systemic hypertension.  5. Medication noncompliance.  6. History of chronic ethanol use.  7. History of seizure disorder.    PAST SURGICAL HISTORY: Status post drainage of pancreatic pseudocyst in the  past.    ALLERGIES: NO KNOWN DRUG ALLERGIES.    HOME MEDICATION: Dilantin, dose unknown.    FAMILY HISTORY: Per records, positive for diabetes and hypertension.    SOCIAL HISTORY: The patient is a chronic alcoholic and also a smoker, per  records.    REVIEW OF SYSTEMS: Could not be obtained, as the patient appears obtunded  and confused at the time of my evaluation.    PHYSICAL EXAMINATION  VITAL SIGNS: Temperature  101.8, heart rate 136, respiratory rate 32, blood  pressure 115/77, oxygen saturation 100%.  GENERAL: The patient is lying in bed, confused, disheveled, with a pungent  smell of ethanol on his breath, with malodorous body odor.  HEENT: Pupils were equal and reactive and sclerae were anicteric.  Oropharynx and mucous membranes were dry and extraocular muscles were  intact.  NECK: Supple. No jugular venous  distention. No carotid bruit noted.  CARDIOVASCULAR: Normal S1, S2. Regular rate and rhythm. Tachycardic.  RESPIRATORY: Chest was clear to auscultation bilaterally.  ABDOMEN: Soft, nondistended. Normoactive bowel sounds.  EXTREMITIES: The patient was moving all extremities, but complete  neurological examination could not be done because the patient was  extremely confused at the time of my evaluation.    LABORATORY DATA: Complete blood count revealed a white blood cell count of  9.4, hemoglobin 11.9, hematocrit 33.5, and platelets 67. Differential:  Neutrophils 73, bands 1, lymphocytes 10, monocytes 16.    Basic metabolic profile revealed a sodium of 130, potassium 2.7, chloride  85, bicarbonate 25, BUN 11, creatinine 0.89, glucose 176. Calcium was 6.9.  Lipase was 45. CK was 80, CK-MB was 1.1, CK-MB  index was 1.4, with troponin  less than 0.02.    Dilantin level was low at 0.6.    IMAGING DATA: CT of the head revealed chronic subdural hematoma.    Chest x-ray per my read revealed no acute findings, though the official  read is currently pending.    EKG revealed sinus tachycardia, rate 113, with left ventricular  hypertrophy, with no prior EKG available to me for comparison.    IMPRESSION  1. Alcohol intoxication.  2. Delirium tremens.  3. Systemic inflammatory response syndrome, rule out probable sepsis.  4. Acute encephalopathy.  5. Dehydration.  6. Hypokalemia.  7. Thrombocytopenia.  8. History of seizure disorder.  9. History of pancreatitis in the past.  10. History of pancreatic pseudocyst, status post drainage in the past.  11. Systemic hypertension, currently controlled.    PLAN  1. Alcohol intoxication with delirium tremens. The patient will be started  on daily banana bag and ethanol level ordered is currently pending. When  off banana bag, the patient will be continued on D5 normal saline with 20  mEq of potassium chloride at 150 mL per hour. The patient will be placed on  fall and aspiration precautions. A 1-to-1 sitter should be at the bedside  at all times and electrolytes, especially magnesium and phosphorus, will be  checked and repleted as needed, and will also be checking vitamin B12  levels during this admission. The patient will be monitored closely in the  medical intensive care unit and routine neuro checks will be done.  2. Systemic inflammatory response syndrome. The patient has a fever and is  tachycardic and tachypneic. At this time, 2 sets of blood cultures,  urinalysis, urine cultures, and lactic acid will be checked and the patient  will be started on empiric vancomycin and Zosyn. Antibiotic regimen may be  modified if necessary based on culture results and sensitivity. The initial  chest imaging revealed no acute findings.  3. Hypokalemia. Potassium will be repleted and will be  checking levels in  the morning.  4. Dehydration. The patient will be continued on intravenous hydration as  stated above and monitored closely for clinical improvement.  5. History of seizure disorder. The patient's Dilantin level was  subtherapeutic, most likely from noncompliance. At this time, the patient  will be started on Dilantin 100 mg IV every 8 hours and Keppra 500 mg IV  every 12 hours, and placed on seizure precautions. Will also be consulting  Neurology this admission and we await further recommendation.  6. Acute encephalopathy. Neuro checks will be done in the medical  intensive care unit and we will be checking ammonia levels, though the initial  CT revealed no acute findings.  7.  Prophylaxis. The patient was started on Protonix, which is adequate for  gastrointestinal prophylaxis and sequential compression devices will be  applied to bilateral lower extremities for deep venous thrombosis  prophylaxis.    The patient is critically ill at this time.    Total critical care time taken for this history and physical including  talking to the ER physician and nurses, extensive review of entire medical  records, talking to the ER nurses, my assessment and evaluation and  dictation took approximately 75 minutes.                       Dictated By: Fredrik Cove, DO   Signed By:    NSL:wmx  D: 07/04/2012 06:32 A T: 07/04/2012 07:19 A  Job #:  [id number]  CScriptDoc #:  [id number]  cc:   Fredrik Cove, DO

## 2012-07-04 NOTE — ED Notes (Signed)
Dr. Gwen Her at patients bedside assessing patient

## 2012-07-04 NOTE — Progress Notes (Signed)
Problem: Self Care Deficits Care Plan (Adult)  Goal: *Acute Goals and Plan of Care (Insert Text)  Occupational Therapy Goals  Initiated 07/04/2012 within 7 day(s).    1. Patient will perform grooming with supervision/set-up standing at sink for more than 5 minutes.  2. Patient will perform upper body dressing with independence.  3. Patient will perform lower body dressing with independence.  4. Patient will perform safe bed/chair to toilet transfers with supervision/set-up.  5. Patient will perform all aspects of toileting with supervision/set-up.  6. Patient will participate in upper extremity therapeutic exercise/activities with independence for 10 minutes.   7. Patient will utilize energy conservation techniques during functional activities with verbal, visual and tactile cues.   OCCUPATIONAL THERAPY EVALUATION    Patient: Jeremy Walters (51 y.o. male)  Date: 07/04/2012  Primary Diagnosis: delirium tremens, seizure d/o, subdural hematoma, nausea/vomiting  Alcohol withdrawal seizure  Alcohol withdrawal seizure        Precautions:   Fall;Seizure;Contact      ASSESSMENT :   Based on the objective data described below, the patient presents with decreased balance, strength, endurance. Requires minimal to CGA for ADLs and functional mobility/transfers.    Patient will benefit from skilled intervention to address the above impairments.  Patient???s rehabilitation potential is considered to be Fair  Factors which may influence rehabilitation potential include:   [ ]              None noted  [ ]              Mental ability/status  [X]              Medical condition  [ ]              Home/family situation and support systems  [ ]              Safety awareness  [ ]              Pain tolerance/management  [ ]              Other:        PLAN :   Recommendations and Planned Interventions:  [X]                Self Care Training                  [X]         Therapeutic Activities  [X]                Functional Mobility Training    [ ]          Cognitive Retraining  [X]                Therapeutic Exercises           [ ]         Endurance Activities  [X]                Balance Training                   [ ]         Neuromuscular Re-Education  [ ]                Visual/Perceptual Training     [ ]    Home Safety Training  [X]                Patient Education                 [ ]   Family Training/Education  [ ]                Other (comment):    Frequency/Duration: Patient will be followed by occupational therapy 4 times a week to address goals.  Discharge Recommendations: Inpatient Psych Rehab and To be determined  Further Equipment Recommendations for Discharge: TBD       SUBJECTIVE:   Patient stated ???I am alright.???      OBJECTIVE DATA SUMMARY:       Past Medical History   Diagnosis Date   ??? Pancreatitis chronic     ??? Seizure     ??? Alcoholism       Past Surgical History   Procedure Date   ??? Hx gi         EGD     Barriers to Learning/Limitations: no  Compensate with: visual, verbal, tactile, kinesthetic cues/model  GCODES (GO) n/a  Prior Level of Function/Home Situation: independent  Home Situation  Home Environment:  (homeless)  [X]   Right hand dominant          [ ]   Left hand dominant  Cognitive/Behavioral Status:  Neurologic State: Eyes open to voice;Drowsy  Orientation Level: Oriented X4  Cognition: Decreased attention/concentration;Decreased command following;Poor safety awareness  Safety/Judgement: Decreased awareness of environment;Decreased awareness of need for safety;Decreased awareness of need for assistance;Decreased insight into deficits;Fall prevention;Home safety  Skin: intact  Edema: none  Vision/Perceptual:    Tracking: Able to track stimulus in all quadrants w/o difficulty    Coordination:  Coordination: Generally decreased, functional  Fine Motor Skills-Upper: Left Intact;Right Intact    Gross Motor Skills-Upper: Left Intact;Right Intact  Balance:  Sitting: Impaired  Sitting - Static: Fair (occasional)  Sitting - Dynamic: Poor (constant  support)  Standing:  (unable to assess. pt feeling light headed)  Strength:  Strength: Generally decreased, functional  Tone & Sensation:  Tone: Normal  Sensation: Intact  Range of Motion:  AROM: Generally decreased, functional  PROM: Within functional limits  Functional Mobility and Transfers for ADLs:  Bed Mobility:  Rolling: Additional time;Assist X1;CGA;Minimal  assistance;Safety concerns;Verbal cues  Supine to Sit: Additional time;Assist X1;CGA;Minimal assistance;Safety concerns;Verbal cues  Sit to Supine: Additional time;Assist X1;CGA;Minimum assistance;Safety concerns;Verbal cues  Transfers:  Sit to Stand:  (unable to assess)  ADL Assessment:  Feeding: Stand-by assistance    Oral Facial Hygiene/Grooming: Minimum assistance    Bathing: Moderate assistance    Upper Body Dressing: Minimum assistance    Lower Body Dressing: Moderate assistance    Toileting: Moderate assistance  ADL Intervention:  Upper Body Dressing Assistance  Dressing Assistance: Minimum assistance  Hospital Gown: Minimum  assistance    Cognitive Retraining  Safety/Judgement: Decreased awareness of environment;Decreased awareness of need for safety;Decreased awareness of need for assistance;Decreased insight into deficits;Fall prevention;Home safety  Patient education:  Educated on role of OT for rehab while pt is in the hospital. Pt verbalized understanding.    Pain:  Pain Scale 1: Numeric (0 - 10)  Pain Intensity 1: 0  Activity Tolerance:   Poor   Please refer to the flowsheet for vital signs taken during this treatment.  After treatment:   [ ]  Patient left in no apparent distress sitting up in chair  [X]  Patient left in no apparent distress in bed  [X]  Call bell left within reach  [X]  Nursing notified  [ ]  Caregiver present  [ ]  Bed alarm activated      COMMUNICATION/EDUCATION:   [ ]  Home safety education was provided and  the patient/caregiver indicated understanding.  [ ]  Patient/family have participated as able in goal setting and plan of  care.  [X]  Patient/family agree to work toward stated goals and plan of care.  [ ]  Patient understands intent and goals of therapy, but is neutral about his/her participation.  [ ]  Patient is unable to participate in goal setting and plan of care.    Thank you for this referral.  Risa Grill, OT  Time Calculation: 12 mins

## 2012-07-04 NOTE — Progress Notes (Signed)
Chaplain attempted to conducted an initial consultation and Spiritual Assessment forVincent E Walters, who is a 50 y.o.,male. Patient???s Primary Language is: [place].   According to the patient???s EMR Religious Affiliation is: No religion.     The reason the Patient came to the hospital is:   Patient Active Problem List    Diagnosis Date Noted   ??? 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 Thrombocytopenia, unspecified 07/04/2012   ??? SVT (supraventricular tachycardia) 07/04/2012   ??? Subdural hematoma, chronic 07/04/2012   ??? DTs (delirium tremens) 07/04/2012   ??? Melena 07/04/2012   ??? Anemia, unspecified 07/04/2012   ??? SIRS (systemic inflammatory response syndrome) 07/04/2012   ??? Electrolyte abnormality 07/04/2012   ??? Seizure         The Chaplain provided the following Interventions:  Initiated a relationship of care and support.   Provided information about Spiritual Care Services.  Offered prayer and assurance of continued prayers on patients behalf.   Chart reviewed.    The following outcomes were achieved:  No outcomes were achieved as the patient did not respond to the chaplain.    Assessment:  The patient did not appear to be in any distress during this encounter.  From Chart review it would appear that the patient should not have any religious/cultural needs that will affect patient???s preferences in health care.  There are no further spiritual or religious issues which require Spiritual Care Services interventions at this time.       Plan:  Chaplains will continue to follow and will provide pastoral care on an as needed/requested basis.  Chaplain recommends bedside caregivers page chaplain on duty if patient shows signs of acute spiritual or emotional distress.      Chaplain Briscoe Deutscher, MDiv,   Board Certified Chaplain  [phone] - Office

## 2012-07-04 NOTE — ED Notes (Signed)
Patient placed on oxygen at 2L via nasal canula

## 2012-07-04 NOTE — Progress Notes (Signed)
Problem: Mobility Impaired (Adult and Pediatric)  Goal: *Acute Goals and Plan of Care (Insert Text)  Physical Therapy Goals  Initiated 07/04/2012 and to be accomplished within 7 day(s)  1. Patient will move from supine to sit and sit to supine and roll side to side in bed with independence.   2. Patient will transfer from bed to chair and chair to bed with modified independence using the least restrictive device.  3. Patient will perform sit to stand with modified independence.  4. Patient will ambulate with modified independence for 100 feet with the least restrictive device.   5. Patient will ascend/descend 3 stairs with 1 handrail(s) with minimal assistance/contact guard assist.   PHYSICAL THERAPY EVALUATION    Patient: Jeremy Walters (51 y.o. male)  Date: 07/04/2012  Primary Diagnosis: delirium tremens, seizure d/o, subdural hematoma, nausea/vomiting  Alcohol withdrawal seizure  Alcohol withdrawal seizure        Precautions:   Contact;Fall;Seizure      ASSESSMENT :   Based on the objective data described below, the patient presents with decreased mobility and function.  Patient is currently limited sec increased dizziness.  Patient demonstrated fair strength but has some tremors with movement.  Patient would benefit from focus on balance training.    Patient will benefit from skilled intervention to address the above impairments.  Patient???s rehabilitation potential is considered to be Fair  Factors which may influence rehabilitation potential include:   [ ]          None noted  [ ]          Mental ability/status  [X]          Medical condition  [ ]          Home/family situation and support systems  [ ]          Safety awareness  [ ]          Pain tolerance/management  [ ]          Other:        PLAN :   Recommendations and Planned Interventions:  [X]            Bed Mobility Training             [X]     Neuromuscular Re-Education  [X]            Transfer Training                   [ ]     Orthotic/Prosthetic Training   [X]            Gait Training                          [ ]     Modalities  [X]            Therapeutic Exercises          [ ]     Edema Management/Control  [X]            Therapeutic Activities            [X]     Patient and Family Training/Education  [ ]            Other (comment):    Frequency/Duration: Patient will be followed by physical therapy 1-2 times per day/4-7 days per week to address goals.  Discharge Recommendations: To Be Determined  Further Equipment Recommendations for Discharge: tbd       SUBJECTIVE:   Patient stated ???I feel cold.???  OBJECTIVE DATA SUMMARY:       Past Medical History   Diagnosis Date   ??? Pancreatitis chronic     ??? Seizure     ??? Alcoholism       Past Surgical History   Procedure Date   ??? Hx gi         EGD     Barriers to Learning/Limitations: yes;  other detox  Compensate with: visual, verbal, tactile, kinesthetic cues/model  GCODES(GP):N/A  Prior Level of Function/Home Situation: independent  Home Situation  Home Environment:  (homeless)  Critical Behavior:  Neurologic State: Drowsy;Eyes open spontaneously  Orientation Level: Oriented X4  Cognition: Decreased attention/concentration;Decreased command following;Poor safety awareness  Safety/Judgement: Decreased awareness of environment;Decreased awareness of need for safety;Decreased awareness of need for assistance;Decreased insight into deficits;Fall prevention;Home safety  Psychosocial  Patient Behaviors: Calm;Cooperative;Lethargic  Family  Behaviors: Other (comment) (none present)  Visitor Behaviors: Other (comment) (none presnet)  Needs Expressed: Financial  Purposeful Interaction: Yes  Pt Identified Daily Priority: Clinical issues (comment)  Caritas Process: Nurture loving kindness;Establish trust;Teaching/learning;Attend basic human needs;Supportive expression  Caring Interventions: Reassure;Therapeutic modalities  Reassure: Therapeutic listening;Informing;Acceptance;Quiet presence;Caring rounds  Therapeutic Modalities: Intentional  therapeutic touch  Family  Behaviors: Other (comment) (none present)  Strength:    Strength: Generally decreased, functional (hip flex R 4-/5 L 4/5; B knee flex, ext 4/5)  Tone & Sensation:   Tone: Normal  Sensation: Impaired (B feet)  Range Of Motion:  AROM: Generally decreased, functional  PROM: Within functional limits  Functional Mobility:  Bed Mobility:  Rolling: Additional time;Assist X1;CGA;Minimal  assistance;Safety concerns;Verbal cues  Supine to Sit: Additional time;Assist X1;CGA;Minimal assistance;Safety concerns;Verbal cues  Sit to Supine: Additional time;Assist X1;CGA;Minimum assistance  Transfers:  Sit to Stand:  (unable at this time sec dizziness with sitting)  Balance:   Sitting: Impaired  Sitting - Static: Fair (occasional) (fair minus/fair)  Sitting - Dynamic: Poor (constant support)  Standing:  (unable at this time sec dizziness with sitting)  Ambulation/Gait Training:  Gait Description (WDL):  (unable at this time sec dizziness with sitting)  Pain:  Pain Scale 1: Numeric (0 - 10)  Pain Intensity 1: 0  Activity Tolerance:   poor  Please refer to the flowsheet for vital signs taken during this treatment.  After treatment:   [ ]          Patient left in no apparent distress sitting up in chair  [X]          Patient left in no apparent distress in bed  [X]          Call bell left within reach  [X]          Nursing notified  [X]          Sitter present  [ ]          Bed alarm activated      COMMUNICATION/EDUCATION:   [X]          Fall prevention education was provided and the patient/caregiver indicated understanding.  [X]          Patient/family have participated as able in goal setting and plan of care.  [X]          Patient/family agree to work toward stated goals and plan of care.  [ ]          Patient understands intent and goals of therapy, but is neutral about his/her participation.  [ ]          Patient is unable to participate in goal setting and  plan of care.    Thank you for this referral.  Rosalene Billings   Time Calculation: 12 mins

## 2012-07-04 NOTE — ED Notes (Signed)
Patient incont of urine at this time.  Patient cleansed and clean sheets applied.  Patient will wake up when stimulated but falls immediately back to sleep.  Patient confused and not speeking coherently. Dr. Edilia Bo aware. Patient remains on cardiac, BP and pulse ox monitors remain intact.  Patients heart rate 146.

## 2012-07-04 NOTE — Progress Notes (Addendum)
.  Pt received in bed awake. A & O x4, c/o pain in stomach rating 5/10. Call bell within reach. Bed locked in low position.      1900- see critical result flowsheet concerning blood cultures

## 2012-07-04 NOTE — Progress Notes (Addendum)
Problem: Dysphagia (Adult)  Goal: *Acute Goals and Plan of Care (Insert Text)    Dysphagia eval completed. Pt A&Ox4, motivated for liquid intake, able to self-feed with mod-max A. Pt presents with mild-mod oropharyngeal dysphagia c/b labored oral prep phase, oral residue s/p swallow and decreased hyolaryngeal excursion during puree and thin liquid trials. Timely swallow reflex noted across study trials without evidence of aspiration/distress. Pt refusing solid trials and requesting liquid diet at this time. SLP educated pt with regard to compensatory swallow strategies and aspiration/reflux precautions, including small bites/sips, alternate liquids/solids, decrease feeding rate, HOB > 45 with all po, and upright in bed at 30 degrees after po for at least 45 minutes. Patient requires assistance with bolus presentation during meals. Rec initiate clear liquid diet. Further rec oral care TID, aspiration precautions, and meds whole, one at a time. SLP will cont to follow and advance diet, as tolerated.    REC:   Clear liquid diet  HOB >45 for po intake, remain >30 for 30-45 minutes after po   Small bites/sips; alternate liquid/solid with slow feeding rate   Verbal cues to swallow twice per bite/sip  Oral care TID  Meds whole, one @ a time    Patient will:  Utilize compensatory swallow maneuvers (decrease bolus size, decrease rate of intake, cyclical ingestion, etc.) to increase swallow function/safety with min visual, verbal and/or tactile cues in 4/5 trials.     Tolerate least-restrictive diet utilizing compensatory swallow techniques (decrease bolus size, decrease rate of intake, cyclical ingestion, etc.) without overt s/sx aspiration/distress in 4/5 trials with min visual, verbal, and/or tactile cues    Perform restorative oropharyngeal exercises and swallow maneuvers (supraglottic swallow, Mendelson maneuver, effortful swallow, OMEs etc.) to increase oropharyngeal strength/awareness/agility, tongue base retraction,  hyolaryngeal excursion, airway protection, and/or bolus clearance with min visual, verbal and/or tactile cues in 4/5 trials.    Demonstrate the ability to adequately self-monitor swallowing skills and perform   appropriate compensatory techniques to reduce s/sx of aspiration and to safely consume least-restrictive diet with min verbal, visual and/or tactile cues in 4/5 trials.  SPEECH LANGUAGE PATHOLOGY BEDSIDE SWALLOW EVALUATION    Patient: Jeremy Walters (51 y.o. male)  Date: 07/04/2012  Primary Diagnosis: delirium tremens, seizure d/o, subdural hematoma, nausea/vomiting  Alcohol withdrawal seizure  Alcohol withdrawal seizure        Precautions: aspiration; pna         ASSESSMENT :   Based on the objective data described below, the patient presents with mild-mod oropharyngeal dysphagia.    Patient will benefit from skilled intervention to address the above impairments.  Patient???s rehabilitation potential is considered to be Fair  Factors which may influence rehabilitation potential include:   [ ]             None noted  [X]             Mental ability/status  [X]             Medical condition  [ ]             Home/family situation and support systems  [ ]             Safety awareness  [ ]             Pain tolerance/management  [ ]             Other:        PLAN :   Recommendations and Planned Interventions:  As above  Frequency/Duration: Patient will be followed  by speech-language pathology 1-2 times per day/4-7 days per week to address goals.  Discharge Recommendations: Home Health, Skilled Nursing Facility and To Be Determined       SUBJECTIVE:   Patient stated ???Thank you???.      OBJECTIVE:       Past Medical History   Diagnosis Date   ??? Pancreatitis chronic     ??? Seizure     ??? Alcoholism       Past Surgical History   Procedure Date   ??? Hx gi         EGD     Prior Level of Function/Home Situation: Independent     Diet prior to admission: Regular  Current Diet:  Full liquids   Cognitive and Communication Status:   Neurologic State: Eyes open spontaneously  Orientation Level: Oriented X4  Cognition: Appropriate safety awareness;Appropriate for age attention/concentration     Perseveration: No perseveration noted     Oral Assessment:  Oral Assessment  Labial: Decreased rate  Dentition: Natural  Lingual: Decreased rate  Velum: No impairment  Mandible: No impairment  P.O. Trials:  Patient Position: HOB 40  Vocal quality prior to P.O.: No impairment  Consistency Presented: Thin liquid;Puree  How Presented: SLP-fed/presented;Spoon;Straw;Successive swallows     Bolus Acceptance: No impairment  Bolus Formation/Control: Impaired  Type of Impairment: Poor;Mastication  Propulsion: Discoordination  Oral Residue: Less than 10% of bolus;Lingual  Initiation of Swallow: No impairment  Laryngeal Elevation: Decreased  Aspiration Signs/Symptoms: None  Pharyngeal Phase Characteristics: Double swallow;Suspected pharyngeal residue  Effective Modifications: Alternate liquids/solids;Small sips and bites  Cues for Modifications: Moderate       Oral Phase Severity: Mild-moderate  Pharyngeal Phase Severity : Mild  Pain:  Pain Scale 1: Numeric (0 - 10)  Pain Intensity 1: 0     After treatment:   [ ]             Patient left in no apparent distress sitting up in chair  [X]             Patient left in no apparent distress in bed  [ ]             Call bell left within reach  [ ]             Nursing notified  [ ]             Caregiver present  [ ]             Bed alarm activated      COMMUNICATION/EDUCATION:   [ ]             Posted safety precautions in patient's room.  [X]             Patient/family have participated as able in goal setting and plan of care.  [ ]             Patient/family agree to work toward stated goals and plan of care.  [ ]             Patient understands intent and goals of therapy, but is neutral about his/her participation.  [ ]             Patient is unable to participate in goal setting and plan of care.    Thank you for this referral.     Hilbert Bible, M.S., CCC-SLP  Office: [phone]  Pager: [phone]

## 2012-07-04 NOTE — Progress Notes (Signed)
Calculated ~CrCl 79 ml/min  Vancomycin 1250 mg IV x 1 dose to load patient today, then 1 gram IV q18h beginning tomorrow with a trough level on 07/06/12.

## 2012-07-04 NOTE — ED Notes (Signed)
Patient suddenly woke up agitated and trying to get up out of bed and thinking that he is in jail and speech garbled and laughing inappropriately.  Jeremy Walters at patients bedside.  Heart rated 165.

## 2012-07-04 NOTE — ED Provider Notes (Signed)
I personally saw and examined the patient.  I have reviewed and agree with the MLP's findings, including all diagnostic interpretations, and plans as written.   I was present during the key portions of separately billed procedures.    Jeremy Walters D Tidus Upchurch, MD    Insidiously worsening tachycardia, has CHRONIC subdurals, EKG showed SVT and given a trial of Adenosine without effect.  Now found to have fever, being admitted to the ICU

## 2012-07-04 NOTE — Progress Notes (Signed)
Daily Progress Note    Patient: Jeremy Walters   Age:  51 y.o.  DOA: 07/03/2012   Admit Dx / CC: delirium tremens, seizure d/o, subdural hematoma, nausea/vomiting  Alcohol withdrawal seizure  Alcohol withdrawal seizure  LOS:  LOS: 1 day       Assessment/ Plan:  Hospital Problems  Never Reviewed        Codes Class Noted POA    *Alcohol withdrawal seizure 291.81  07/04/2012 Yes        Seizure 780.39  Unknown Yes    Overview    Signed 07/04/2012  9:20 AM by Jairo Ben, MD     keppra, dilantin, neurology           Thrombocytopenia, unspecified 287.5  07/04/2012 Yes    Overview    Addendum 07/04/2012  9:19 AM by Jairo Ben, MD     Due to alcoholic liver disease   Monitor           SVT (supraventricular tachycardia) 427.89  07/04/2012 Yes    Overview    Addendum 07/04/2012  9:46 AM by Jairo Ben, MD     Possibly due to Abnormal electrolyte,   cardiac enzymes negative echo BB          Subdural hematoma, 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 No evidence of acute hemorrhage or additional acute intracranial abnormality."            DTs (delirium tremens) 291.0  07/04/2012 Yes    Overview    Addendum 07/04/2012  9:43 AM by Jairo Ben, MD     Alcohol withdrawal protocol   UDS 07/04/12 negative           Melena 578.1  07/04/2012 Yes    Overview    Signed 07/04/2012  9:34 AM by Jairo Ben, MD     Guaiac stool pending           Anemia, unspecified 285.9  07/04/2012 Yes    Overview    Signed 07/04/2012  9:39 AM by Jairo Ben, MD     Anemia profile           SIRS (systemic inflammatory response syndrome) 995.90  07/04/2012 Yes    Overview    Addendum 07/04/2012  9:47 AM by Jairo Ben, MD     Lactate 4.6  BCx 07/04/12 x 2  Fever tachycardic tachypnea   Vancomycin/zosyn          Electrolyte abnormality 276.9  07/04/2012 Yes    Overview     Signed 07/04/2012  9:47 AM by Jairo Ben, MD     Hypokalemia hypophosphatemia                 DVT prophylaxis SCDs  Disposition home with home health       Subjective:    start PO  Good uo bm  Lethargic    Review of Systems - History obtained from the patient  General ROS: positive for  - fatigue and malaise  Psychological ROS: negative  Ophthalmic ROS: negative  ENT ROS: negative  Allergy and Immunology ROS: negative  Hematological and Lymphatic ROS: negative  Endocrine ROS: negative  Respiratory ROS: no cough, shortness of breath, or wheezing  Cardiovascular ROS: no chest pain or dyspnea on exertion  Gastrointestinal ROS: no  abdominal pain, change in bowel habits, or black or bloody stools  Genito-Urinary ROS: no dysuria, trouble voiding, or hematuria  Musculoskeletal ROS: negative  Neurological ROS: no TIA or stroke symptoms  Dermatological ROS: negative        Objective:   Patient Vitals for the past 24 hrs:   Temp Pulse Resp BP SpO2   07/04/12 0855 - 139  25  136/87 mmHg 100 %   07/04/12 0700 - 130  25  142/90 mmHg 97 %   07/04/12 0630 - 123  28  122/68 mmHg 97 %   07/04/12 0600 - 132  35  130/85 mmHg 95 %   07/04/12 0540 - 136  32  115/77 mmHg -   07/04/12 0535 101.8 ??F (38.8 ??C) - - - -   07/04/12 0532 99.9 ??F (37.7 ??C) - - - -   07/04/12 0520 - 142  - 107/74 mmHg 100 %   07/04/12 0515 - 143  20  - 99 %   07/04/12 0505 - 146  31  97/75 mmHg -   07/04/12 0500 - 146  26  92/67 mmHg -   07/04/12 0450 - 147  29  124/79 mmHg -   07/04/12 0430 - 143  34  119/69 mmHg 95 %   07/04/12 0422 - 153  25  - -   07/04/12 0330 - 160  - 111/81 mmHg -   07/04/12 0300 - 159  - 114/82 mmHg -   07/04/12 0240 - 159  - 116/90 mmHg -   07/04/12 0230 - 152  - 118/103 mmHg -   07/04/12 0200 - 142  - 110/70 mmHg -   07/04/12 0100 - 122  26  110/77 mmHg -   07/04/12 0030 - 127  - 96/66 mmHg 98 %   07/04/12 0000 - 134  - 107/75 mmHg 98 %   07/03/12 2330 - 123  22  102/79 mmHg 99 %   07/03/12 2300 - 119  22  105/69 mmHg 99 %    07/03/12 2230 - 123  - 102/73 mmHg 100 %   07/03/12 2200 - 113  - 105/84 mmHg 100 %   07/03/12 2132 98.6 ??F (37 ??C) 116  24  94/73 mmHg 100 %   07/03/12 2130 - 123  17  94/73 mmHg 100 %       Physical Exam:  General appearance: Alert, cooperative, no distress, appears stated age  Head: Normocephalic, without obvious abnormality, atraumatic  Neck: Supple, no lymphadenopathy or thyromegaly, trachea midline  Lungs: Clear to auscultation bilaterally  Heart: Regular rate and rhythm, S1, S2 normal, no murmur, click, rub or gallop  Abdomen: Soft, non-tender and not-distended. Bowel sounds normal. No masses, no organomegaly  Extremities: Extremities normal, atraumatic, no cyanosis or edema  Skin: Skin color, texture, turgor normal. No rashes or lesions  Neurologic: Grossly normal and non focal, normal muscle tone and power, cranial nerves are intact, normal sensation, AOx3     Intake and Output:  Current Shift:     Last three shifts:             Jairo Ben, MD  07/04/2012      Hospitalist Service  [facility]   [facility]  [phone] office  Pager [phone]

## 2012-07-04 NOTE — Other (Signed)
TRANSFER - OUT REPORT:    Verbal report given to [facility] spears,RN(name) on Olive Bass  being transferred to 2800(unit) for routine progression of care       Report consisted of patient???s Situation, Background, Assessment and   Recommendations(SBAR).     Information from the following report(s) SBAR, ED Summary and MAR was reviewed with the receiving nurse.    Opportunity for questions and clarification was provided.

## 2012-07-05 LAB — METABOLIC PANEL, COMPREHENSIVE
A-G Ratio: 0.8 (ref 0.8–1.7)
ALT (SGPT): 43 U/L (ref 12.0–78.0)
AST (SGOT): 109 U/L — ABNORMAL HIGH (ref 15–37)
Albumin: 2 g/dL — ABNORMAL LOW (ref 3.4–5.0)
Alk. phosphatase: 266 U/L — ABNORMAL HIGH (ref 50–136)
Anion gap: 10 mmol/L (ref 3.0–18)
BUN/Creatinine ratio: 12 (ref 12–20)
BUN: 9 MG/DL (ref 7.0–18)
Bilirubin, total: 1 MG/DL (ref 0.2–1.0)
CO2: 28 MMOL/L (ref 21–32)
Calcium: 7.1 MG/DL — ABNORMAL LOW (ref 8.5–10.1)
Chloride: 101 MMOL/L (ref 100–108)
Creatinine: 0.78 MG/DL (ref 0.6–1.3)
GFR est AA: >60 mL/min/{1.73_m2} (ref >60)
GFR est non-AA: >60 mL/min/{1.73_m2} (ref >60)
Globulin: 2.6 g/dL (ref 2.0–4.0)
Glucose: 101 MG/DL — ABNORMAL HIGH (ref 74–99)
Potassium: 2.6 MMOL/L — CL (ref 3.5–5.5)
Protein, total: 4.6 g/dL — ABNORMAL LOW (ref 6.4–8.2)
Sodium: 139 MMOL/L (ref 136–145)

## 2012-07-05 LAB — CARDIAC PANEL,(CK, CKMB & TROPONIN)
CK - MB: 1.6 ng/ml (ref 0.5–3.6)
CK-MB Index: 0.4 % (ref 0.0–4.0)
CK: 366 U/L — ABNORMAL HIGH (ref 39–308)
Troponin-I, QT: <0.02 NG/ML (ref 0.0–0.045)

## 2012-07-05 LAB — CBC WITH AUTOMATED DIFF
ABS. BASOPHILS: 0 10*3/uL (ref 0.0–0.06)
ABS. EOSINOPHILS: 0 10*3/uL (ref 0.0–0.4)
ABS. LYMPHOCYTES: 0.7 10*3/uL — ABNORMAL LOW (ref 0.9–3.6)
ABS. MONOCYTES: 0.6 10*3/uL (ref 0.05–1.2)
ABS. NEUTROPHILS: 2.6 10*3/uL (ref 1.8–8.0)
BASOPHILS: 0 % (ref 0–2)
EOSINOPHILS: 0 % (ref 0–5)
HCT: 27.2 % — ABNORMAL LOW (ref 36.0–48.0)
HGB: 9.6 g/dL — ABNORMAL LOW (ref 13.0–16.0)
LYMPHOCYTES: 18 % — ABNORMAL LOW (ref 21–52)
MCH: 30.9 PG (ref 24.0–34.0)
MCHC: 35.3 g/dL (ref 31.0–37.0)
MCV: 87.5 FL (ref 74.0–97.0)
MONOCYTES: 16 % — ABNORMAL HIGH (ref 3–10)
MPV: 11.2 FL (ref 9.2–11.8)
NEUTROPHILS: 66 % (ref 40–73)
PLATELET: 38 10*3/uL — ABNORMAL LOW (ref 135–420)
RBC: 3.11 M/uL — ABNORMAL LOW (ref 4.70–5.50)
RDW: 14.3 % (ref 11.6–14.5)
WBC: 3.9 10*3/uL — ABNORMAL LOW (ref 4.6–13.2)

## 2012-07-05 LAB — HEMATOCRIT: HCT: 25.9 % — ABNORMAL LOW (ref 36.0–48.0)

## 2012-07-05 LAB — RETICULOCYTE COUNT: Reticulocyte count: 1.9 % (ref 0.5–2.3)

## 2012-07-05 LAB — IRON PROFILE
Iron % saturation: 28 %
Iron: 36 ug/dL — ABNORMAL LOW (ref 50–175)
TIBC: 130 ug/dL — ABNORMAL LOW (ref 250–450)

## 2012-07-05 LAB — CALCIUM, IONIZED
Ionized Calcium: 0.99 MMOL/L — ABNORMAL LOW (ref 1.12–1.32)
Ionized Calcium: 1.08 MMOL/L — ABNORMAL LOW (ref 1.12–1.32)

## 2012-07-05 LAB — MAGNESIUM: Magnesium: 1.5 MG/DL — ABNORMAL LOW (ref 1.8–2.4)

## 2012-07-05 LAB — AMMONIA: Ammonia, plasma: 73 umol/L — ABNORMAL HIGH (ref 11–32)

## 2012-07-05 LAB — POTASSIUM: Potassium: 3.3 MMOL/L — ABNORMAL LOW (ref 3.5–5.5)

## 2012-07-05 LAB — OCCULT BLOOD, STOOL: Occult blood, stool: NEGATIVE

## 2012-07-05 LAB — AMYLASE: Amylase: 30 U/L (ref 25–115)

## 2012-07-05 LAB — FERRITIN: Ferritin: 204 NG/ML (ref 8–388)

## 2012-07-05 LAB — VITAMIN B12 & FOLATE
Folate: 7.4 ng/mL (ref 5.38–24.0)
Vitamin B12: 688 pg/mL (ref 211–911)

## 2012-07-05 LAB — WBC, STOOL: White blood cells, stool: NEGATIVE /HPF

## 2012-07-05 LAB — PHOSPHORUS
Phosphorus: 3 MG/DL (ref 2.5–4.9)
Phosphorus: 2.2 MG/DL — ABNORMAL LOW (ref 2.5–4.9)

## 2012-07-05 LAB — LIPASE: Lipase: 50 U/L — ABNORMAL LOW (ref 73–393)

## 2012-07-05 LAB — PHENYTOIN: Phenytoin: 11.1 ug/mL (ref 10.0–20.0)

## 2012-07-05 LAB — HEMOGLOBIN: HGB: 8.8 g/dL — ABNORMAL LOW (ref 13.0–16.0)

## 2012-07-05 LAB — C DIFFICILE TOXIN A & B BY EIA: C. difficile toxin A&B: NEGATIVE

## 2012-07-05 MED ADMIN — chlordiazePOXIDE (LIBRIUM) capsule 10 mg: ORAL | @ 23:00:00 | NDC 51079037401

## 2012-07-05 MED ADMIN — thiamine (B-1) tablet 100 mg: ORAL | @ 21:00:00 | NDC 90011015050

## 2012-07-05 MED ADMIN — magnesium sulfate 2 g/50 ml IVPB (premix or compounded): INTRAVENOUS | @ 20:00:00 | NDC 00409672924

## 2012-07-05 MED ADMIN — potassium chloride 10 mEq in 100 ml IVPB: INTRAVENOUS | @ 20:00:00 | NDC 00338070948

## 2012-07-05 MED ADMIN — potassium chloride 10 mEq in 100 ml IVPB: INTRAVENOUS | @ 19:00:00 | NDC 00338070948

## 2012-07-05 MED ADMIN — metroNIDAZOLE (FLAGYL) IVPB premix 500 mg: INTRAVENOUS | @ 18:00:00 | NDC 00338105548

## 2012-07-05 MED ADMIN — metoprolol (LOPRESSOR) tablet 25 mg: ORAL | @ 23:00:00 | NDC 51079025501

## 2012-07-05 MED ADMIN — potassium chloride 10 mEq in 100 ml IVPB: INTRAVENOUS | @ 18:00:00 | NDC 00338070948

## 2012-07-05 MED ADMIN — Potassium & Sodium Phosphates (NEUTRA-PHOS) packet 1 Packet: ORAL | @ 19:00:00

## 2012-07-05 MED ADMIN — metoprolol (LOPRESSOR) tablet 25 mg: ORAL | @ 14:00:00 | NDC 51079025501

## 2012-07-05 MED ADMIN — folic acid (FOLVITE) tablet 1 mg: ORAL | @ 21:00:00 | NDC 62584089711

## 2012-07-05 MED ADMIN — sodium chloride (NS) flush 5-10 mL: INTRAVENOUS | @ 09:00:00 | NDC 87701099893

## 2012-07-05 MED ADMIN — potassium chloride 10 mEq in 100 ml IVPB: INTRAVENOUS | @ 22:00:00 | NDC 00338070948

## 2012-07-05 MED ADMIN — dextrose 5% - 0.9% NaCl with KCl 40 mEq/L infusion: INTRAVENOUS | @ 11:00:00 | NDC 00338080704

## 2012-07-05 MED ADMIN — piperacillin-tazobactam (ZOSYN) 3.375 g in 0.9% sodium chloride (MBP/ADV) 100 mL MBP: INTRAVENOUS | @ 09:00:00 | NDC 00338055318

## 2012-07-05 MED ADMIN — 0.9% sodium chloride 1,000 mL with mvi, pedi no.2 with vit K 5 mL, thiamine 100 mg, folic acid 1 mg infusion: INTRAVENOUS | @ 15:00:00 | NDC 99999323202

## 2012-07-05 MED ADMIN — potassium chloride 10 mEq in 100 ml IVPB: INTRAVENOUS | NDC 00338070948

## 2012-07-05 MED ADMIN — metroNIDAZOLE (FLAGYL) IVPB premix 500 mg: INTRAVENOUS | NDC 00338105548

## 2012-07-05 MED ADMIN — Potassium & Sodium Phosphates (NEUTRA-PHOS) packet 1 Packet: ORAL | @ 21:00:00

## 2012-07-05 MED ADMIN — piperacillin-tazobactam (ZOSYN) 3.375 g in 0.9% sodium chloride (MBP/ADV) 100 mL MBP: INTRAVENOUS | @ 04:00:00 | NDC 00338055318

## 2012-07-05 MED ADMIN — Potassium & Sodium Phosphates (NEUTRA-PHOS) packet 1 Packet: ORAL | @ 18:00:00

## 2012-07-05 MED ADMIN — potassium chloride (K-DUR, KLOR-CON) SR tablet 40 mEq: ORAL | @ 14:00:00 | NDC 68084036011

## 2012-07-05 MED ADMIN — magnesium oxide (MAG-OX) tablet 400 mg: ORAL | @ 23:00:00 | NDC 96295013573

## 2012-07-05 MED ADMIN — piperacillin-tazobactam (ZOSYN) 3.375 g in 0.9% sodium chloride (MBP/ADV) 100 mL MBP: INTRAVENOUS | @ 17:00:00 | NDC 00338055318

## 2012-07-05 MED ADMIN — pantoprazole (PROTONIX) injection 40 mg: INTRAVENOUS | @ 14:00:00 | NDC 00008400101

## 2012-07-05 MED ADMIN — potassium chloride 10 mEq in 100 ml IVPB: INTRAVENOUS | @ 21:00:00 | NDC 00338070948

## 2012-07-05 MED ADMIN — magnesium oxide (MAG-OX) tablet 400 mg: ORAL | @ 18:00:00 | NDC 96295013573

## 2012-07-05 MED ADMIN — vancomycin (VANCOCIN) 1,000 mg in 0.9% sodium chloride (MBP/ADV) 250 mL adv: INTRAVENOUS | @ 10:00:00 | NDC 00409710102

## 2012-07-05 MED ADMIN — chlordiazePOXIDE (LIBRIUM) capsule 10 mg: ORAL | @ 17:00:00 | NDC 51079037401

## 2012-07-05 MED ADMIN — acetaminophen (TYLENOL) tablet 650 mg: ORAL | @ 06:00:00 | NDC 00904198261

## 2012-07-05 MED ADMIN — dextrose 5% - 0.9% NaCl with KCl 20 mEq/L infusion: INTRAVENOUS | @ 06:00:00 | NDC 00338080304

## 2012-07-05 MED ADMIN — chlordiazePOXIDE (LIBRIUM) capsule 10 mg: ORAL | @ 14:00:00 | NDC 51079037401

## 2012-07-05 MED ADMIN — phenytoin (DILANTIN) 100 mg in 0.9% sodium chloride 50 mL IVPB: INTRAVENOUS | @ 16:00:00 | NDC 00641049321

## 2012-07-05 MED ADMIN — calcium gluconate 2 g in 0.9% sodium chloride 100 mL IVPB: INTRAVENOUS | @ 22:00:00 | NDC 63323031110

## 2012-07-05 MED ADMIN — phenytoin (DILANTIN) 100 mg in 0.9% sodium chloride 50 mL IVPB: INTRAVENOUS | @ 07:00:00 | NDC 00641049321

## 2012-07-05 MED ADMIN — dextrose 5% - 0.9% NaCl with KCl 40 mEq/L infusion: INTRAVENOUS | NDC 00338080704

## 2012-07-05 MED ADMIN — piperacillin-tazobactam (ZOSYN) 3.375 g in 0.9% sodium chloride (MBP/ADV) 100 mL MBP: INTRAVENOUS | @ 21:00:00 | NDC 00338055318

## 2012-07-05 MED ADMIN — sodium chloride (NS) flush 5-10 mL: INTRAVENOUS | @ 04:00:00 | NDC 87701099893

## 2012-07-05 MED ADMIN — chlordiazePOXIDE (LIBRIUM) capsule 10 mg: ORAL | @ 04:00:00 | NDC 51079037401

## 2012-07-05 MED ADMIN — sodium chloride (NS) flush 5-10 mL: INTRAVENOUS | @ 19:00:00 | NDC 87701099893

## 2012-07-05 MED FILL — SODIUM CHLORIDE 0.9 % IV PIGGY BACK: INTRAVENOUS | Qty: 100

## 2012-07-05 MED FILL — VANCOMYCIN 1,000 MG IV SOLR: 1000 mg | INTRAVENOUS | Qty: 1000

## 2012-07-05 MED FILL — PHOS-NAK 280 MG-160 MG-250 MG ORAL POWDER PACKET: 280-160-250 mg | ORAL | Qty: 1

## 2012-07-05 MED FILL — CHLORDIAZEPOXIDE 5 MG CAP: 5 mg | ORAL | Qty: 2

## 2012-07-05 MED FILL — TYLENOL 325 MG TABLET: 325 mg | ORAL | Qty: 2

## 2012-07-05 MED FILL — D5-NS WITH POTASSIUM CHLORIDE 40 MEQ/L IV: 40 mEq/L | INTRAVENOUS | Qty: 1000

## 2012-07-05 MED FILL — METOPROLOL TARTRATE 25 MG TAB: 25 mg | ORAL | Qty: 1

## 2012-07-05 MED FILL — METRONIDAZOLE IN SODIUM CHLORIDE (ISO-OSM) 500 MG/100 ML IV PIGGY BACK: 500 mg/100 mL | INTRAVENOUS | Qty: 100

## 2012-07-05 MED FILL — POTASSIUM CHLORIDE 10 MEQ/100 ML IV PIGGY BACK: 10 mEq/0 mL | INTRAVENOUS | Qty: 100

## 2012-07-05 MED FILL — POTASSIUM CHLORIDE SR 20 MEQ TAB, PARTICLES/CRYSTALS: 20 mEq | ORAL | Qty: 2

## 2012-07-05 MED FILL — VITAMIN B-1 100 MG TABLET: 100 mg | ORAL | Qty: 1

## 2012-07-05 MED FILL — MAGNESIUM SULFATE 2 GRAM/50 ML IVPB: 2 gram/50 mL (4 %) | INTRAVENOUS | Qty: 50

## 2012-07-05 MED FILL — MAGNESIUM OXIDE 400 MG TAB: 400 mg | ORAL | Qty: 1

## 2012-07-05 MED FILL — CALCIUM GLUCONATE 100 MG/ML (10%) IV SOLN: 100 mg/mL (10%) | INTRAVENOUS | Qty: 20

## 2012-07-05 MED FILL — PROTONIX 40 MG INTRAVENOUS SOLUTION: 40 mg | INTRAVENOUS | Qty: 40

## 2012-07-05 MED FILL — D5-NS WITH POTASSIUM CHLORIDE 20 MEQ/L IV: 20 mEq/L | INTRAVENOUS | Qty: 1000

## 2012-07-05 MED FILL — PHENYTOIN SODIUM 50 MG/ML IV: 50 mg/mL | INTRAVENOUS | Qty: 2

## 2012-07-05 MED FILL — FOLIC ACID 1 MG TAB: 1 mg | ORAL | Qty: 1

## 2012-07-05 NOTE — Consults (Signed)
[facility]                  [address] [place], [place]  [address]                                CONSULTATION REPORT    PATIENT:       Jeremy Walters, Jeremy Walters  MRN:               [id number]     ADMITTED:   07/03/2012  CSN:               [id number]    CONSULTED:  07/05/2012  LOCATION:  ATTENDING:     Fredrik Cove, DO  CONSULTING:    Evie Lacks, MD        HEMATOLOGY/ONCOLOGY CONSULTATION    REFERRING PHYSICIAN: Fredrik Cove, DO    CONSULTING PHYSICIAN: Evie Lacks, MD    REASON FOR CONSULTATION: Thrombocytopenia.    HISTORY OF PRESENT ILLNESS: Thank you very much for asking me to see this  51 year old white man, a resident of the [place] area, who has a past  history significant for excessive alcohol consumption with previous  pancreatitis and a history of pancreatic pseudocyst with previous drainage  in the past. The patient also has a history of seizure disorder and  recently has been treated with Dilantin.    The patient presented to the emergency room at [facility] on  07/03/2012, with severe alcohol intoxication. He was found by the emergency  medical squad. He had a seizure, became incontinent of urine and feces and  was brought to the emergency room obtunded and incoherent.    In the emergency room, the patient was noted to have a white blood count of  9400, hemoglobin 11.9, platelets 67,000. He was admitted to the hospital  for further care.    In the hospital, over the last few days, the platelet count has decreased  to 38,000 with a white count of 3900. Additional laboratory studies have  also revealed profoundly low potassium, low calcium, phosphorus, and  magnesium, as well as a markedly elevated AST with ALT normal. Total  protein is 4.6 with an albumin of 2.0, consistent with malnutrition. The  patient's lipase is also abnormal. The venous ammonia is also elevated at  73. In this setting, consultation is requested.    PAST  MEDICAL HISTORY: The patient does have a history of surgical drainage  of a pancreatic pseudocyst. He has a history of previous pancreatitis,  hypertension, medication noncompliance, chronic ethanol consumption, and  seizure disorder.    MEDICATIONS: The patient's medications prior to admission included Dilantin  at an unknown dose.    ALLERGIES: THE PATIENT IS ALLERGIC TO:  1. MORPHINE.  2. SHELLFISH.    FAMILY HISTORY: Positive for diabetes and hypertension.    SOCIAL HISTORY: The patient has a 40 pack year smoking history and  continues to smoke at this time. He has a history of excessive alcohol  consumption and was admitted while severely intoxicated.    REVIEW OF SYSTEMS  The appetite has been poor. The patient has been eating poorly. No  headaches, seizures, or dizziness currently. No hearing loss or visual  changes. No cough, shortness of breath or hemoptysis. No chest pain or  palpitations. No neck pain or stiffness. No breast lumps or pain. No  nausea, vomiting, diarrhea, constipation, indigestion  or bleeding. No  urinary symptoms. No skin rash or itching. No neuromuscular symptoms. No  bruising or bleeding.    PHYSICAL EXAMINATION  GENERAL: On physical exam, this is a chronically ill-appearing white man in  no acute distress.  VITAL SIGNS: Pulse 92 and regular, blood pressure 128/90, temperature 98.8  degrees, respirations 20.  SKIN: No specific lesions.  HEENT: Clear.  NECK: No thyromegaly noted.  NODES: None were palpable.  LUNGS: Clear to auscultation and percussion.  CARDIOVASCULAR: Normal.  ABDOMEN: Liver span 11 cm in the 10 cm line. Spleen is not palpable. No  other masses or tenderness.  GENITAL AND RECTAL: Exams not repeated.  EXTREMITIES: No edema noted.  NEUROLOGIC: Intact and nonfocal.    LABORATORY DATA: As summarized above.    ASSESSMENT:  1. Thrombocytopenia and leukopenia, which are multifactorial in origin.  Inciting factors include chronic alcoholism, underlying hepatic disease and   possibly medication effect.  2. Anemia, likely related to chronic alcohol consumption, malnutrition, and  hepatic disease. The patient should have a colonoscopy, as he does appear  to be mildly iron deficient.  3. Iron-deficiency based on laboratory studies.  4. Hypertension.  5. Seizure disorder.  6. At risk for delirium tremens.    SUGGESTIONS: As outlined above. I would defer changes in the patient's  antiseizure medications to Neurology. Additional laboratory studies are  ordered. I suspect it will be approximately 1 week before the patient's  blood counts begin to recover adequately based on the current clinical  scenario. We will monitor his blood counts with you.    Thanks for asking me to help in his care.                      Evie Lacks, MD      PRC:wmx  D: 07/05/2012 04:28 P T: 07/05/2012 05:12 P  Job #:  [id number]  CScriptDoc #:  [id number]  cc:   Evie Lacks, MD        Fredrik Cove, DO

## 2012-07-05 NOTE — Progress Notes (Signed)
Spoke with Dr. Cyndra Numbers for low p.lets counts, will se ept today.

## 2012-07-05 NOTE — Progress Notes (Signed)
Spoke with ID Dr. Jamey Reas  Reviewed abx, will see pt in am.

## 2012-07-05 NOTE — Progress Notes (Signed)
Unable to run the calcium gluconate and magnesium sulfate because I only have two IV accesses and I am running 2 IV medications in each IV site.  Once one of the other medications is complete I will administer the calcium gluconate and magnesium, both are one time doses.  I called and notified the pharmacy.

## 2012-07-05 NOTE — Progress Notes (Signed)
Daily Progress Note    Patient: Jeremy Walters   Age:  51 y.o.  DOA: 07/03/2012   Admit Dx / CC: delirium tremens, seizure d/o, subdural hematoma, nausea/vomiting  Alcohol withdrawal seizure  Alcohol withdrawal seizure  LOS:  LOS: 2 days       Assessment/ Plan:  Hospital Problems  Never Reviewed        Codes Class Noted POA    *Alcohol withdrawal seizure 291.81  07/04/2012 Yes        Seizure 780.39  Unknown Yes    Overview    Signed 07/04/2012  9:20 AM by Jairo Ben, MD     keppra, dilantin, neurology           Thrombocytopenia, unspecified 287.5  07/04/2012 Yes    Overview    Addendum 07/04/2012  9:19 AM by Jairo Ben, MD     Due to alcoholic liver disease   Monitor           SVT (supraventricular tachycardia) 427.89  07/04/2012 Yes    Overview    Addendum 07/04/2012  9:46 AM by Jairo Ben, MD     Possibly due to Abnormal electrolyte,   cardiac enzymes negative echo BB          Subdural hematoma, 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 No evidence of acute hemorrhage or additional acute intracranial abnormality."            DTs (delirium tremens) 291.0  07/04/2012 Yes    Overview    Addendum 07/04/2012  9:43 AM by Jairo Ben, MD     Alcohol withdrawal protocol   UDS 07/04/12 negative           Melena 578.1  07/04/2012 Yes    Overview    Addendum 07/04/2012  4:20 PM by Jairo Ben, MD     Guaiac stool positive 07/04/12   Stool studies           Anemia, unspecified 285.9  07/04/2012 Yes    Overview    Signed 07/04/2012  9:39 AM by Jairo Ben, MD     Anemia profile           SIRS (systemic inflammatory response syndrome) 995.90  07/04/2012 Yes    Overview    Addendum 07/04/2012  9:47 AM by Jairo Ben, MD     Lactate 4.6  BCx 07/04/12 x 2  Fever tachycardic tachypnea   Vancomycin/zosyn          Electrolyte abnormality 276.9   07/04/2012 Yes    Overview    Signed 07/04/2012  9:47 AM by Jairo Ben, MD     Hypokalemia hypophosphatemia               A/P:    1. sz with non compliance with meds:  Neuro following  No sz x 24 hr  On dilantin only due to expensive keppra and non compliance it was d/ced     Alcohol use: per neuro not alcohol WD sz  monitor for wd  Banana bag  Alcohol liver dx    Bacteremia;SIRS (systemic inflammatory response syndrome  AEROBIC BOTTLE GRAM NEGATIVE RODS bcx x 3. On zosyn,   Get ID consult. Dr. Gloris [place] office called and info given.  platlets count down, need to consider diff abx to  avoid more low counts  On isolation for c.diff pending rpts for diarrhea  Add flagyl    Low calcium: replace    Low K;replace   low phosp:  Low mag:    Abnormal LFT  CK: improving, rpt lab    H/o pancreatitis:  Get labs lispre,amylase    GI:  Thrombocytopenia, unspecified : appears he had it in feb, today low at 38,  Anemia, unspecified   Stool pos for blood    - GI consult, Dr. Adelene Idler consulted, will see pt in am  - cbc serial, monitor for bleeding or brusies,     SVT (supraventricular tachycardia: was thought to be sec to abnormal lytes, correction in place, echo wnl    Subdural hematoma, chronic  Developing per 8/30 ct, ct in am to check for progression, no neuro symptoms now    DTs (delirium tremens): no active DT's    cx-r: nodule: need pa view, ordered to clarify    DVT prophylaxis SCDs  Disposition home with home health       Subjective:   Pt states feel better, still tired, no sz.  Temp continues  Loose stools continues.  Alert, talking giving history  No bleeding or brusies noted      Review of Systems - History obtained from the patient  General ROS: positive for  - fatigue and malaise  Psychological ROS: negative  Ophthalmic ROS: negative  ENT ROS: negative  Allergy and Immunology ROS: negative  Hematological and Lymphatic ROS: negative  Endocrine ROS: negative  Respiratory ROS: no cough, shortness of breath, or wheezing   Cardiovascular ROS: no chest pain or dyspnea on exertion  Gastrointestinal ROS: no abdominal pain, change in bowel habits, or black or bloody stools  Genito-Urinary ROS: no dysuria, trouble voiding, or hematuria  Musculoskeletal ROS: negative  Neurological ROS: no TIA or stroke symptoms  Dermatological ROS: negative        Objective:       Patient Vitals for the past 24 hrs:   Temp Pulse Resp BP SpO2   07/05/12 0937 99.1 ??F (37.3 ??C) 107  20  133/82 mmHg 100 %   07/05/12 0454 98.8 ??F (37.1 ??C) - - - -   07/05/12 0430 99 ??F (37.2 ??C) 90  20  132/82 mmHg 100 %   07/05/12 0030 100.5 ??F (38.1 ??C) 102  22  128/90 mmHg 100 %   07/04/12 1856 100.1 ??F (37.8 ??C) - - - -   07/04/12 1853 - - - 142/90 mmHg -   07/04/12 1816 100.5 ??F (38.1 ??C) 104  22  156/107 mmHg 100 %   07/04/12 1600 98.7 ??F (37.1 ??C) 97  24  115/70 mmHg 100 %   07/04/12 1500 - 95  22  132/94 mmHg 100 %   07/04/12 1400 - 96  26  115/81 mmHg 100 %   07/04/12 1300 - 99  20  111/76 mmHg 98 %   07/04/12 1200 99.2 ??F (37.3 ??C) 101  23  115/78 mmHg 99 %   SUMMARY:  Left ventricle: Systolic function was normal by visual assessment.  Ejection fraction was estimated in the range of 55 % to 60 %. No obvious  wall motion abnormalities identified in the views obtained. Wall thickness  was mildly increased. There was mild concentric hypertrophy.          Physical Exam:  General appearance: Alert, cooperative, no distress, appears stated age  Head: Normocephalic, without obvious abnormality, atraumatic  Neck: Supple, no lymphadenopathy or thyromegaly, trachea midline  Lungs: Clear to auscultation bilaterally  Heart: Regular rate and rhythm, S1, S2 normal, no murmur, click, rub or gallop  Abdomen: Soft, non-tender and not-distended. Bowel sounds normal. No masses, no organomegaly  Extremities: Extremities normal, atraumatic, no cyanosis or edema  Skin: Skin color, texture, turgor normal. No rashes or lesions  Neurologic: Grossly normal and non focal, normal muscle tone and  power, cranial nerves are intact, normal sensation, AOx3     Intake and Output:  Current Shift:  08/31 0700 - 08/31 1859  In: 480 [P.O.:480]  Out: 800 [Urine:800]  Last three shifts:  08/29 1900 - 08/31 0659  In: 2642.5 [P.O.:480; I.V.:2162.5]  Out: 2550 [Urine:2350; Drains:200]          Galvin Proffer, MD  07/05/2012      Hospitalist Service  [facility]   [facility]  [phone] office  Pager [phone]

## 2012-07-05 NOTE — Progress Notes (Signed)
Date: 07/05/2012  Provider: Chalmers Cater, M.D.  Neurology      ACTIVE PROBLEMS:  Seizure disorder  alcoholism        Subjective:dizzy when standing    Objective:    DPH 11    SOCIAL HISTORY:  Unchanged     ALLERGIES:  ???  No Known Allergies    FAMILY HISTORY:  Family history unchanged    PHYSICAL FINDINGS:     The patient was alert, cooperative and   in no distress.  Speech was normal.  Affect was normal.  Cranial nerves 2-12 were intact.  Eye movements were full and conjugate, pursuit movements were smooth, saccades were accurate and there was marked high-amplitude nystagmus.  There was no dysarthria or dysphagia.    Motor examination revealed normal tone and bulk throughout.  Reflexes were 2+ and symmetric.  P There was no pronator drift.   FTN/HTS coordination was normal.  Gait and stance were  Not tested.    ASSESSMENT:    Clinically dilantin toxic with level of 11.1 and albumen of 2        Plan:  Reduce dilantin dose to 200 mg /day po starting tomorrow evening  Dc IV dilantin now  Check free and total dilantin levels  Need dc planning input re:  Finding affordable supply of medication    Chalmers Cater, M.D.

## 2012-07-05 NOTE — Consults (Signed)
Thanks for consultation request.  Please see my dictated consult note for details.  Thank you.  #[phone]

## 2012-07-05 NOTE — Other (Signed)
Bedside, Verbal, Recorded and Written shift change report given to Suzanne Boron, RN  (oncoming nurse) by Fredrich Romans, RN  (offgoing nurse).  Report given with Kardex, Intake/Output, MAR and Recent Results.

## 2012-07-05 NOTE — Progress Notes (Signed)
Spoke with neuro about dilantin and low p.let count.    Recommended to monitor for now, no changes in meds.

## 2012-07-05 NOTE — Other (Signed)
Bedside and Verbal shift change report given to Dora Sosa RN (oncoming nurse) by BOBBY W CHERRY, RN   (offgoing nurse).  Report given with SBAR, Kardex, MAR and Recent Results.

## 2012-07-05 NOTE — Progress Notes (Signed)
SLP attempted to eval Pt this morning; however, RN reported that the Pt had just been send down for an X-ray.   Armanda Heritage, M.S. CCC-SLP

## 2012-07-05 NOTE — Other (Addendum)
Received pt in bed eyes closed but response to verbal stimuli. Sitter at the bed side. Foley catheter emptied .  No seizures activity at this time noted.  2250  Incontinent of bowel, leaking from FMS, readjusted,peri care provided. No voiced complaints.  07/06/12   0056  Reinserted new IV #20g to Right wrist. Pt tolerated venipuncture.  0310  Peri care provided due to leaking from FMS. Re-inflated & turned pt. No signs of distress.  0340  Changed FMS bag with 900 ml of greenish loose stools. Stools & urine samples obtained & sent to lab.  0630  Off unit for Xray & CT head.  0704  Returned to unit. Pt AOx3. Denies any distress. No seizures activity noted.

## 2012-07-05 NOTE — Other (Signed)
0150: Pt resting with eyes closed. Easily awakens with voice stimuli. Pt given prn tylenol for fever. No signs of distress. Sitter at [employer]. Changed FMS bag with 900 ml dark  Brown liquidy stool. Foley patent and draining amber urine. Bed in low position. Call bell  Within reach. Sitter instructed to call nurse if needs assist with anything. CNA verbalized understanding.   0222 Received call from Delfin Edis from lab re: positive blood culture in aerobic bottle, gram negative rods. Will notify MD regarding + blood culture.   0300 Pt resting with eyes closed, pt still has hiccups. Sitter at bedside. No signs of distress at this time. Pt still on seizure precautions.   43: Spoke with Dr Newt Lukes regarding + blood culture. Pt already getting zosyn and vanco, no additional antibiotic/med needed at this time.   47 Sitter gave pt complete bed bath. Pt tolerated procedure well. Lab tech at bedside drawing blood. Rechecked temp, now 98.9 (orally). Emptied foley, of clear yellow urine. FMS draining greenish liquidy stool. Sitter at bedside. No other c/o from pt.   5409 Jeremy Walters (Lab) called regarding critical potassium level 2.6. Will Notify MD.   0630 Dr Newt Lukes notified of critical potassium level and received new orders.

## 2012-07-06 LAB — CULTURE, BLOOD

## 2012-07-06 LAB — FIBRINOGEN: Fibrinogen: 340 mg/dL (ref 210–451)

## 2012-07-06 LAB — METABOLIC PANEL, COMPREHENSIVE
A-G Ratio: 0.7 — ABNORMAL LOW (ref 0.8–1.7)
ALT (SGPT): 32 U/L (ref 12.0–78.0)
AST (SGOT): 64 U/L — ABNORMAL HIGH (ref 15–37)
Albumin: 1.8 g/dL — ABNORMAL LOW (ref 3.4–5.0)
Alk. phosphatase: 228 U/L — ABNORMAL HIGH (ref 50–136)
Anion gap: 8 mmol/L (ref 3.0–18)
BUN/Creatinine ratio: 5 — ABNORMAL LOW (ref 12–20)
BUN: 3 MG/DL — ABNORMAL LOW (ref 7.0–18)
Bilirubin, total: 0.9 MG/DL (ref 0.2–1.0)
CO2: 26 MMOL/L (ref 21–32)
Calcium: 7.1 MG/DL — ABNORMAL LOW (ref 8.5–10.1)
Chloride: 105 MMOL/L (ref 100–108)
Creatinine: 0.65 MG/DL (ref 0.6–1.3)
GFR est AA: >60 mL/min/{1.73_m2} (ref >60)
GFR est non-AA: >60 mL/min/{1.73_m2} (ref >60)
Globulin: 2.5 g/dL (ref 2.0–4.0)
Glucose: 110 MG/DL — ABNORMAL HIGH (ref 74–99)
Potassium: 3.7 MMOL/L (ref 3.5–5.5)
Protein, total: 4.3 g/dL — ABNORMAL LOW (ref 6.4–8.2)
Sodium: 139 MMOL/L (ref 136–145)

## 2012-07-06 LAB — GIARDIA LAMBLIA, AG, STOOL: Giardia lamblia Ag, EIA: NEGATIVE

## 2012-07-06 LAB — CBC WITH AUTOMATED DIFF
ABS. BASOPHILS: 0 10*3/uL (ref 0.0–0.06)
ABS. EOSINOPHILS: 0 10*3/uL (ref 0.0–0.4)
ABS. LYMPHOCYTES: 0.7 10*3/uL — ABNORMAL LOW (ref 0.9–3.6)
ABS. MONOCYTES: 0.5 10*3/uL (ref 0.05–1.2)
ABS. NEUTROPHILS: 2.4 10*3/uL (ref 1.8–8.0)
BASOPHILS: 1 % (ref 0–2)
EOSINOPHILS: 1 % (ref 0–5)
HCT: 26.7 % — ABNORMAL LOW (ref 36.0–48.0)
HGB: 9.3 g/dL — ABNORMAL LOW (ref 13.0–16.0)
LYMPHOCYTES: 20 % — ABNORMAL LOW (ref 21–52)
MCH: 30.7 PG (ref 24.0–34.0)
MCHC: 34.8 g/dL (ref 31.0–37.0)
MCV: 88.1 FL (ref 74.0–97.0)
MONOCYTES: 13 % — ABNORMAL HIGH (ref 3–10)
MPV: 11.4 FL (ref 9.2–11.8)
NEUTROPHILS: 65 % (ref 40–73)
PLATELET: 51 10*3/uL — ABNORMAL LOW (ref 135–420)
RBC: 3.03 M/uL — ABNORMAL LOW (ref 4.70–5.50)
RDW: 14 % (ref 11.6–14.5)
WBC: 3.6 10*3/uL — ABNORMAL LOW (ref 4.6–13.2)

## 2012-07-06 LAB — BILIRUBIN, DIRECT: Bilirubin, direct: 0.4 MG/DL — ABNORMAL HIGH (ref 0.0–0.2)

## 2012-07-06 LAB — PROTHROMBIN TIME + INR
INR: 1 (ref 0.8–1.2)
Prothrombin time: 12.1 s (ref 11.5–15.2)

## 2012-07-06 LAB — RPR
RPR: NONREACTIVE
RPR: NONREACTIVE

## 2012-07-06 LAB — HEMATOCRIT: HCT: 26.3 % — ABNORMAL LOW (ref 36.0–48.0)

## 2012-07-06 LAB — HEMOGLOBIN: HGB: 8.9 g/dL — ABNORMAL LOW (ref 13.0–16.0)

## 2012-07-06 LAB — URINALYSIS W/ RFLX MICROSCOPIC
Bilirubin: NEGATIVE
Glucose: NEGATIVE MG/DL
Ketone: NEGATIVE MG/DL
Leukocyte Esterase: NEGATIVE
Nitrites: NEGATIVE
Protein: NEGATIVE MG/DL
Specific gravity: 1.01 (ref 1.003–1.030)
Urobilinogen: 0.2 EU/DL (ref 0.2–1.0)
pH (UA): 6.5 (ref 5.0–8.0)

## 2012-07-06 LAB — AMMONIA: Ammonia, plasma: 47 umol/L — ABNORMAL HIGH (ref 11–32)

## 2012-07-06 LAB — URINE MICROSCOPIC ONLY: RBC: 0 /HPF (ref 0–5)

## 2012-07-06 LAB — CULTURE, STOOL

## 2012-07-06 LAB — C DIFFICILE TOXIN A & B BY EIA
C. difficile toxin A&B: NEGATIVE
C. difficile toxin A&B: NEGATIVE

## 2012-07-06 LAB — PTT: aPTT: 30.2 s (ref 24.6–37.7)

## 2012-07-06 MED ADMIN — acetaminophen (TYLENOL) tablet 650 mg: ORAL | @ 01:00:00 | NDC 00904198261

## 2012-07-06 MED ADMIN — pantoprazole (PROTONIX) injection 40 mg: INTRAVENOUS | @ 13:00:00 | NDC 00008400101

## 2012-07-06 MED ADMIN — chlordiazePOXIDE (LIBRIUM) capsule 10 mg: ORAL | @ 17:00:00 | NDC 51079037401

## 2012-07-06 MED ADMIN — sodium chloride (NS) flush 5-10 mL: INTRAVENOUS | @ 10:00:00 | NDC 87701099893

## 2012-07-06 MED ADMIN — magnesium oxide (MAG-OX) tablet 400 mg: ORAL | @ 13:00:00 | NDC 96295013573

## 2012-07-06 MED ADMIN — chlordiazePOXIDE (LIBRIUM) capsule 10 mg: ORAL | @ 22:00:00 | NDC 51079037401

## 2012-07-06 MED ADMIN — piperacillin-tazobactam (ZOSYN) 3.375 g in 0.9% sodium chloride (MBP/ADV) 100 mL MBP: INTRAVENOUS | @ 09:00:00 | NDC 00338055318

## 2012-07-06 MED ADMIN — thiamine (B-1) tablet 100 mg: ORAL | @ 13:00:00 | NDC 90011015050

## 2012-07-06 MED ADMIN — magnesium oxide (MAG-OX) tablet 400 mg: ORAL | @ 22:00:00 | NDC 96295013573

## 2012-07-06 MED ADMIN — vancomycin (VANCOCIN) 1,000 mg in 0.9% sodium chloride (MBP/ADV) 250 mL adv: INTRAVENOUS | @ 04:00:00 | NDC 00409710102

## 2012-07-06 MED ADMIN — ceFAZolin (ANCEF) 1 g in 0.9% sodium chloride (MBP/ADV) 50 mL MBP: INTRAVENOUS | @ 22:00:00 | NDC 00781345170

## 2012-07-06 MED ADMIN — ceFAZolin (ANCEF) 1,000 mg in sterile water (preservative free) injection: INTRAVENOUS | @ 19:00:00 | NDC 72572005525

## 2012-07-06 MED ADMIN — pantoprazole (PROTONIX) injection 40 mg: INTRAVENOUS | @ 01:00:00 | NDC 00008400101

## 2012-07-06 MED ADMIN — metroNIDAZOLE (FLAGYL) IVPB premix 500 mg: INTRAVENOUS | @ 07:00:00 | NDC 00338105548

## 2012-07-06 MED ADMIN — 0.9% sodium chloride infusion: INTRAVENOUS | @ 12:00:00 | NDC 00409798309

## 2012-07-06 MED ADMIN — metoprolol (LOPRESSOR) tablet 25 mg: ORAL | @ 13:00:00 | NDC 51079025501

## 2012-07-06 MED ADMIN — sodium chloride (NS) flush 5-10 mL: INTRAVENOUS | @ 17:00:00 | NDC 87701099893

## 2012-07-06 MED ADMIN — LORazepam (ATIVAN) tablet 1-2 mg: ORAL | @ 01:00:00 | NDC 68084008911

## 2012-07-06 MED ADMIN — chlordiazePOXIDE (LIBRIUM) capsule 10 mg: ORAL | @ 13:00:00 | NDC 51079037401

## 2012-07-06 MED ADMIN — chlordiazePOXIDE (LIBRIUM) capsule 10 mg: ORAL | @ 01:00:00 | NDC 51079037401

## 2012-07-06 MED ADMIN — metoprolol (LOPRESSOR) tablet 25 mg: ORAL | @ 22:00:00 | NDC 62584026511

## 2012-07-06 MED ADMIN — phenytoin ER (DILANTIN ER) ER capsule 200 mg: ORAL | @ 22:00:00 | NDC 68084037611

## 2012-07-06 MED ADMIN — folic acid (FOLVITE) tablet 1 mg: ORAL | @ 13:00:00 | NDC 62584089711

## 2012-07-06 MED ADMIN — metroNIDAZOLE (FLAGYL) IVPB premix 500 mg: INTRAVENOUS | @ 17:00:00 | NDC 00338105548

## 2012-07-06 MED ADMIN — piperacillin-tazobactam (ZOSYN) 3.375 g in 0.9% sodium chloride (MBP/ADV) 100 mL MBP: INTRAVENOUS | @ 16:00:00 | NDC 00338055318

## 2012-07-06 MED ADMIN — sodium chloride (NS) flush 5-10 mL: INTRAVENOUS | @ 01:00:00 | NDC 87701099893

## 2012-07-06 MED ADMIN — piperacillin-tazobactam (ZOSYN) 3.375 g in 0.9% sodium chloride (MBP/ADV) 100 mL MBP: INTRAVENOUS | @ 03:00:00 | NDC 00338055318

## 2012-07-06 MED FILL — METOPROLOL TARTRATE 25 MG TAB: 25 mg | ORAL | Qty: 1

## 2012-07-06 MED FILL — PROTONIX 40 MG INTRAVENOUS SOLUTION: 40 mg | INTRAVENOUS | Qty: 40

## 2012-07-06 MED FILL — LORAZEPAM 1 MG TAB: 1 mg | ORAL | Qty: 1

## 2012-07-06 MED FILL — TYLENOL 325 MG TABLET: 325 mg | ORAL | Qty: 2

## 2012-07-06 MED FILL — CEFAZOLIN 1 GRAM SOLUTION FOR INJECTION: 1 gram | INTRAMUSCULAR | Qty: 1000

## 2012-07-06 MED FILL — CHLORDIAZEPOXIDE 5 MG CAP: 5 mg | ORAL | Qty: 2

## 2012-07-06 MED FILL — SODIUM CHLORIDE 0.9 % IV PIGGY BACK: INTRAVENOUS | Qty: 100

## 2012-07-06 MED FILL — METRONIDAZOLE IN SODIUM CHLORIDE (ISO-OSM) 500 MG/100 ML IV PIGGY BACK: 500 mg/100 mL | INTRAVENOUS | Qty: 100

## 2012-07-06 MED FILL — MAGNESIUM OXIDE 400 MG TAB: 400 mg | ORAL | Qty: 1

## 2012-07-06 MED FILL — FOLIC ACID 1 MG TAB: 1 mg | ORAL | Qty: 1

## 2012-07-06 MED FILL — VANCOMYCIN 1,000 MG IV SOLR: 1000 mg | INTRAVENOUS | Qty: 1000

## 2012-07-06 MED FILL — PHENYTOIN SODIUM EXTENDED 100 MG CAP: 100 mg | ORAL | Qty: 2

## 2012-07-06 MED FILL — VITAMIN B-1 100 MG TABLET: 100 mg | ORAL | Qty: 1

## 2012-07-06 MED FILL — SODIUM CHLORIDE 0.9 % IV: INTRAVENOUS | Qty: 1000

## 2012-07-06 NOTE — Other (Signed)
Bedside and Verbal shift change report given to Bobby Cherry,RN (oncoming nurse) by R.Sosa,RN (offgoing nurse).  Report given with SBAR, Kardex, Intake/Output, MAR and Recent Results.

## 2012-07-06 NOTE — Progress Notes (Signed)
Patient resting in bed, sitter at the bedside.  Patient has no complaints of pain or discomfort.

## 2012-07-06 NOTE — Progress Notes (Signed)
Problem: Mobility Impaired (Adult and Pediatric)  Goal: *Acute Goals and Plan of Care (Insert Text)  Physical Therapy Goals  Initiated 07/04/2012 and to be accomplished within 7 day(s)  1. Patient will move from supine to sit and sit to supine and roll side to side in bed with independence.   2. Patient will transfer from bed to chair and chair to bed with modified independence using the least restrictive device.  3. Patient will perform sit to stand with modified independence.  4. Patient will ambulate with modified independence for 100 feet with the least restrictive device.   5. Patient will ascend/descend 3 stairs with 1 handrail(s) with minimal assistance/contact guard assist.   PHYSICAL THERAPY TREATMENT    Patient: Jeremy Walters (51 y.o. male)  Date: 07/06/2012  Diagnosis: delirium tremens, seizure d/o, subdural hematoma, nausea/vomiting  Alcohol withdrawal seizure  Alcohol withdrawal seizure Alcohol withdrawal seizure       Precautions: Fall;Seizure;Contact  Chart, physical therapy assessment, plan of care and goals were reviewed.      ASSESSMENT:   Patient follows commands, decreased bed mobility, safety awareness, decreased balance in sitting and standing, decreased safety and independence of functional  mobility  Progression toward goals:  [ ]       Improving appropriately and progressing toward goals  [X]       Improving slowly and progressing toward goals  [ ]       Not making progress toward goals and plan of care will be adjusted       PLAN:   Patient continues to benefit from skilled intervention to address the above impairments. Continue treatment per established plan of care.  Discharge Recommendations:  Rehab and Skilled Nursing Facility  Further Equipment Recommendations for Discharge:  tbd rolling walker?       SUBJECTIVE:   Patient stated ???I feel weak.???      OBJECTIVE DATA SUMMARY:   Critical Behavior:  Neurologic State: Alert  Orientation Level: Oriented to person;Oriented X4  Cognition:  Follows commands  Safety/Judgement: Decreased awareness of environment;Decreased awareness of need for assistance;Decreased awareness of need for safety;Home safety;Fall prevention  Functional Mobility Training:  Bed Mobility:  Rolling: Additional time;Assist X1;Minimal  assistance;Verbal cues;Safety concerns  Supine to Sit: Additional time;Assist X1;Minimal assistance;Safety concerns;Verbal cues  Sit to Supine: Additional time;Assist X1;Minimum assistance;Safety concerns;Verbal cues  Transfers:  Sit to Stand: Additional time;Assist X1;Minimum assistance;Verbal cues;Safety concerns  Stand to Sit: Additional time;Assist X1;Minimum assistance;Safety concerns;Verbal cues  Balance:  Sitting: Impaired  Sitting - Static: Fair (occasional)  Sitting - Dynamic: Fair (occasional)  Standing: Impaired  Standing - Static: Fair  Standing - Dynamic : Fair  Ambulation/Gait Training:  Distance (ft): 2 Feet (ft)  Assistive Device: Walker, rolling  Ambulation - Level of Assistance: Minimal assistance  Gait Description (WDL): Exceptions to WDL  Gait Abnormalities: Decreased step clearance;Path deviations (side steps)  Base of Support: Center of gravity altered;Widened  Speed/Cadence: Shuffled;Slow  Step Length: Left shortened;Right shortened  Interventions: Safety awareness training;Tactile cues;Verbal cues;Visual/Demos  Stairs:  Stairs - Level of Assistance: Unable at this time  Neuro Re-Education:  sat eob for 5 minutes, stood with walker for 2 minutes side stepping to head of bed  Therapeutic Exercises:    ankle pumps, glut sets, laq's in sitting  Pain:  Pain Scale 1: Numeric (0 - 10)  Pain Intensity 1: 5  Activity Tolerance:   fair  Please refer to the flowsheet for vital signs taken during this treatment.  After treatment:   [ ]   Patient left in no apparent distress sitting up in chair  [X]  Patient left in no apparent distress in bed  [X]  Call bell left within reach  [X]  Nursing notified pain 5/10  [X]   Sitter present  [ ]  Bed  alarm activated      Burnett Harry, PT   Time Calculation: 25 mins

## 2012-07-06 NOTE — Consults (Signed)
Date of Admission: 07/03/2012  Date of Consult: 07/06/2012  Requesting Physician: Dr. Ephriam Jenkins  Reason for Consult: BSI GNRS      C  v  Current Antibiotics:  Prior    Ancef-0 Vancomycin, Zosyn and metronidazole     Microbiology: e. COLI pan-sensitive     Assessment:     BSI-E. 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 No evidence of acute hemorrhage or additional acute intracranial abnormality      Chronic/acute alcoholism          Plan:       I have independently examined the patient and reviewed all lab studies and imgaing as well as review of nursing notes and physican notes from the past 24 hours. The plan of care has been discussed with the patient and all questions are answered.         Subjective:    This is the first time that we have been asked to evaluate this 51 y.o. Caucasian male with the above past medical history problems who presents with a history of fever  of 2 days duration.     HPI: HISTORY OF PRESENT ILLNESS: The patient is a 51 year old, white male with a   history of alcohol abuse, The patient presented to the emergency room at [facility] on   07/03/2012, with severe alcohol intoxication. He was found by the emergency   medical squad. He had a seizure, became incontinent of urine and feces and   was brought to the emergency room obtunded and incoherent.   In the emergency room, the patient was noted to have a white blood count of   9400, hemoglobin 11.9, platelets 67,000. He was admitted to the hospital   for further care.   In the hospital, over the last few days, the platelet count has decreased   to 38,000 with a white count of 3900. The patient was   quite obtunded, incoherent at the time of presentation.  He is more coherent   and oriented today. He admits to being a heavy drinker at times, drinking a   half a gallon of wine per day. He does complain of some nausea and dry   heaves. He denies vomiting or hematemesis. He does complain of intermittent   solid food dysphagia. He does have a history of heartburn and GERD. There   is no history of peptic ulcer disease, jaundice, hepatitis, chronic liver   disease. He does have a history of pancreatitis and in fact, had a   pseudocyst drained endoscopically by Dr. Danelle Earthly about 7 or 8 years ago. He   does complain of mild diffuse abdominal discomfort. He describes   intermittent diarrhea and constipation. He denies melena or hematochezia.   As best I can tell, he has never had a colonoscopy. He was noted to have   Hemoccult-positive stool on admission, but subsequent stools have been   Hemoccult negative. He is known to have a chronic anemia. He was also noted   to have a chronic subdural hematoma on CT  scan of the head at the time of   admission. He was also noted to have electrolyte imbalance. Pt had a fever of 101.8 F the early aM of 8/30. Subsequent blood cultures  3 of 3 now growing E. Coli. I am now asked   to see the patient for further evaluation of the above problems.      Past Medical History   Diagnosis Date   ??? Pancreatitis chronic    ??? Seizure    ??? Alcoholism      Past Surgical History   Procedure Date   ??? Hx gi      EGD     History reviewed. No pertinent family history.     Medications reviewed as below:   Current Facility-Administered Medications   Medication Dose Route Frequency   ??? 0.9% sodium chloride infusion  100 mL/hr IntraVENous CONTINUOUS   ??? acetaminophen (TYLENOL) tablet 650 mg  650 mg Oral Q4H PRN   ??? calcium gluconate 2 g in 0.9% sodium chloride 100 mL IVPB  2 g IntraVENous ONCE   ??? potassium chloride 10 mEq in 100 ml IVPB  10 mEq IntraVENous Q1H   ??? magnesium sulfate 2 g/50 ml IVPB (premix or compounded)  2 g IntraVENous ONCE   ??? magnesium oxide  (MAG-OX) tablet 400 mg  400 mg Oral BID   ??? Potassium & Sodium Phosphates (NEUTRA-PHOS) packet 1 Packet  1 Packet Oral Q2H   ??? metroNIDAZOLE (FLAGYL) IVPB premix 500 mg  500 mg IntraVENous Q8H   ??? pantoprazole (PROTONIX) injection 40 mg  40 mg IntraVENous Q12H   ??? phenytoin ER (DILANTIN ER) ER capsule 200 mg  200 mg Oral ONCE   ??? thiamine (B-1) tablet 100 mg  100 mg Oral DAILY   ??? folic acid (FOLVITE) tablet 1 mg  1 mg Oral DAILY   ??? potassium chloride 10 mEq in 100 ml IVPB  10 mEq IntraVENous Q1H   ??? metoprolol (LOPRESSOR) tablet 25 mg  25 mg Oral BID   ??? ondansetron (ZOFRAN) injection 4 mg  4 mg IntraVENous Q4H PRN   ??? piperacillin-tazobactam (ZOSYN) 3.375 g in 0.9% sodium chloride (MBP/ADV) 100 mL MBP  3.375 g IntraVENous Q6H   ??? acetaminophen (TYLENOL) suppository 650 mg  650 mg Rectal Q6H PRN   ??? sodium chloride (NS) flush 5-10 mL  5-10 mL IntraVENous Q8H   ??? sodium chloride (NS) flush 5-10 mL  5-10 mL IntraVENous PRN   ??? haloperidol lactate (HALDOL) injection 1 mg  1 mg IntraVENous Q2H PRN   ??? LORazepam (ATIVAN) tablet 1-2 mg  1-2 mg Oral Q1H PRN   ??? LORazepam (ATIVAN) tablet 3-4 mg  3-4 mg Oral Q1H PRN   ??? chlordiazePOXIDE (LIBRIUM) capsule 10 mg  10 mg Oral QID   ??? vancomycin (VANCOCIN) 1,000 mg in 0.9% sodium chloride (MBP/ADV) 250 mL adv  1,000 mg IntraVENous Q18H   ??? VANCOMYCIN INFORMATION NOTE   Other ONCE     Allergies   Allergen Reactions   ??? Morphine Rash and Nausea and Vomiting   ??? Shellfish Derived Rash and Nausea and Vomiting     History     Social History   ??? Marital Status: SINGLE     Spouse Name: N/A     Number of Children: N/A   ??? Years of Education: N/A     Occupational History   ??? Not on file.     Social History Main Topics   ??? Smoking status:  Not on file   ??? Smokeless tobacco: Not on file   ??? Alcohol Use: Yes   ??? Drug Use:    ??? Sexually Active:      Other Topics Concern   ??? Not on file     Social History Narrative   ??? No narrative on file    Recent travel no. Denies exposure to live cattle  or farm animals. 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 Does not engage in outdoor activities such as gardening, hiking, biking, fishing, camping or hunting. Pets:  yes    Review of Systems  General:Has fevers, no chills, myalgias, arthralgias, unexplained weight loss or gain; denies malasie or fatigue.  HEENT: denies headahces,sinus pain or presure, recent URI or dental procedures; denies tinnitus, hearing loss , visual changes, catarats, dizziness or blurred vision  PUlMONARY: denies cough , shortness of breath, sputum production, hx of asthma or COPD. Denies previous treatement for TB or PPD.  Cardiovascular: denies chest pain, previous CAD or MI, vavlular heart disease, but h/o r murmurs, no Hx of rheumatic fever.  GI:  Denies nausea, vomiting, but diarrhea, no abdominal pain, constipation  GU: denies urinary frequency, dysuria, hematuria, or bladder incontinence, h/o bladder infections  Neurologic: Denies seizures, syncope or prior CVA/TIA  Musculoskeletal:  Denies myalgias or arthralgias, denies joint pain or swelling.Denies back pain or hx of sciatica.  Skin:  Denies new rashes or skin lesions, puritis or recurrent cellulitis. Denies chronic stasis ulcer.  Endocrine: Denies hx of diabetes or thyroid disease  Psych: Denies depression or treatment by a psychiatrist/psycologist  Heme-Onc: Denies prior DVT, coagulopathies or diagnosis of cancer        Objective:     BP 131/94   Pulse 87   Temp 98.2 ??F (36.8 ??C)   Resp 18   Ht 5\' 6"  (1.676 m)   Wt 56.7 kg (125 lb)   BMI 20.18 kg/m2   SpO2 96%  Temp (24hrs), Avg:98.9 ??F (37.2 ??C), Min:98.2 ??F (36.8 ??C), Max:100.2 ??F (37.9 ??C)        General:   awake alert and oriented, well appearing, no distress, appears stated age, well nourished, dishevel    Skin:   no rashes or skin lesions noted on limited exam   HEENT:  Normocephalic, atraumatic, PERRL, EOMI, no scleral icterus or pallor; no conjunctival hemmohage; no sinus pain, nasal and oral mucous are  moist and without evidence of lesions. No thrush. Dentetion porNeck supple, no JVD, no bruits.   Lymph Nodes:   no cervical, axillary or inguinal adenopathy   Lungs:   non-labored, bilaterally clear to aspiration- no crackles wheezes rales or rhonchi   Heart:  RRR, s1 and s2; no murmurs rubs or gallops, no edema, + pedal pulses   Abdomen:  soft, non-distended, active bowel sounds, no hepatomegaly, no splenomegaly, no bruits or pulsatile masses. Appropriate surgical scars for stated surgeries       Genitourinary:  deferred       Extremities:   no clubbing, cyanosis; no joint effusions or swelling; Full ROM of all large joints to the upper and lower extremities; muscle mass and tone appropriate for age   Neurologic:  No gross focal abnormalities; . Speech appropirate. Cranial nerves intact   Psychiatric:   appropriate and interactive.       Labs: Results:   Chemistry Recent Labs   Basename 07/06/12 0510 07/05/12 0450 07/04/12 2010 07/04/12 0600    GLU 110* 101* -- 114*  NA 139 139 -- 135*    K 3.7 2.6* 3.3* --    CL 105 101 -- 93*    CO2 26 28 -- 28    BUN 3* 9 -- 15    CREA 0.65 0.78 -- 0.69    CA 7.1* 7.1* -- 6.8*    AGAP 8 10 -- 14    BUCR 5* 12 -- 22*    TBIL 0.9 1.0 -- 0.6    GPT 32 43 -- 61    AP 228* 266* -- 325*    TP 4.3* 4.6* -- 5.3*    ALB 1.8* 2.0* -- 2.1*    GLOB 2.5 2.6 -- 3.2    AGRAT 0.7* 0.8 -- 0.7*      CBC w/Diff Recent Labs   Basename 07/06/12 0510 07/05/12 1930 07/05/12 1200 07/05/12 0450 07/04/12 0600    WBC 3.6* -- -- 3.9* 6.7    RBC 3.03* -- -- 3.11* 3.53*    HGB 9.3* 8.9* 8.8* -- --    HCT 26.7* 26.3* 25.9* -- --    PLT 51* -- -- 38* 54*    GRANS 65 -- -- 66 77*    LYMPH 20* -- -- 18* 7*    EOS 1 -- -- 0 0      Microbiology Recent Labs   Basename 07/04/12 1100 07/04/12 0949 07/04/12 0620 07/04/12 0600    CULT NO AEROMONAS,SALMONELLA,SHIGELLA,E. COLI 0:157 OR CAMPYLOBACTER ISOLATED AEROBIC BOTTLE ESCHERICHIA COLI For Susceptibility Refer to Culture [id number] AEROBIC AND ANAEROBIC BOTTLES  ESCHERICHIA COLI For Susceptibility Refer to Culture [id number] AEROBIC AND ANAEROBIC BOTTLES ESCHERICHIA COLI        Specimen Description::     BLOOD    Special Requests::     NO SPECIAL REQUESTS    GRAM STAIN:     GRAM NEGATIVE RODS AEROBIC AND ANAEROBIC BOTTLES  SMEAR CALLED TO AND CORRECTLY REPEATED BY: Venetie BENZINEB,RN,2100 ON 07/04/12 AT 1902 TO [facility]    Culture result::     AEROBIC AND ANAEROBIC BOTTLES ESCHERICHIA COLI    Report Status:     07/06/2012 FINAL          Culture & Susceptibility       ESCHERICHIA COLI          Antibiotic  Sensitivity  MIC  Unit  Status      Ampicillin  Susceptible  4   Final      Method:  MIC      Ampicillin/sulbactam  Susceptible  <=2   Final      Method:  MIC      Aztreonam  Susceptible  <=1   Final      Method:  MIC      Cefazolin  Susceptible  <=4   Final      Method:  MIC      Cefepime  Susceptible  <=1   Final      Method:  MIC      Ceftazidime  Susceptible  <=1   Final      Method:  MIC      Ceftriaxone  Susceptible  <=1   Final      Method:  MIC      Gentamicin  Susceptible  <=1   Final      Method:  MIC      Levofloxacin  Susceptible  <=0.12   Final      Method:  MIC      Meropenem  Susceptible  <=0.25  Final      Method:  MIC      Piperacillin/Tazobac  Susceptible  <=4   Final      Method:  MIC      Tobramycin  Susceptible  <=1   Final      Method:  MIC      Trimeth-Sulfamethoxa  Susceptible  <=20   Final      Method:  MIC       Comments  ESCHERICHIA COLI        AEROBIC AND ANAEROBIC BOTTLES ESCHERICHIA COLI                Lab and Collection       CULTURE, BLOOD on 07/04/2012          Result History       CULTURE, BLOOD on 07/06/12.                    Result Information          Status            Final result (07/06/2012 7:21 AM)                Imaging: cxrIMPRESSION:   1. Poorly defined subcentimeter nodule in the right upper lobe. Chest CT is   suggested as clinically warranted for further characterization.   2. Small bilateral pleural effusions versus basilar pleural  thickening with   minimal patchy atelectasis or infiltrate at both lung bases.       Tawnya Crook, MD  07/06/2012  2:42 PM

## 2012-07-06 NOTE — Progress Notes (Signed)
Problem: Dysphagia (Adult)  Goal: *Acute Goals and Plan of Care (Insert Text)    Dysphagia tx completed. Pt A&Ox4, motivated for liquid intake, able to self-feed with mod-max A. Pt presents with mild oropharyngeal dysphagia c/b labored oral prep phase and decreased hyolaryngeal excursion during puree, mech-soft and thin liquid trials. Timely swallow reflex noted across study trials without evidence of aspiration/distress. Pt continues to refuse solids at this time secondary to stomach discomfort. SLP d/w pt that should he want solids, he???ll be safe for a mechanical-soft, chopped meat, thin liquid diet; pt agreed. SLP educated pt again with regard to compensatory swallow strategies and aspiration/reflux precautions, including small bites/sips, alternate liquids/solids, decrease feeding rate, HOB > 45 with all po, and upright in bed at 30 degrees after po for at least 45 minutes. Patient requires assistance with bolus presentation during meals. Rec continue full liquid diet; with MD, SLP, RN to advance as tolerated. D/W RN.     REC:   Full liquid diet (or mech-soft, chopped meat, thin liquid diet should pt request).   HOB >45 for po intake, remain >30 for 30-45 minutes after po   Small bites/sips; alternate liquid/solid with slow feeding rate   Verbal cues to swallow twice per bite/sip  Oral care TID  Meds whole, one @ a time    Patient will:  Utilize compensatory swallow maneuvers (decrease bolus size, decrease rate of intake, cyclical ingestion, etc.) to increase swallow function/safety with min visual, verbal and/or tactile cues in 4/5 trials.     Tolerate least-restrictive diet utilizing compensatory swallow techniques (decrease bolus size, decrease rate of intake, cyclical ingestion, etc.) without overt s/sx aspiration/distress in 4/5 trials with min visual, verbal, and/or tactile cues    Perform restorative oropharyngeal exercises and swallow maneuvers (supraglottic swallow, Mendelson maneuver, effortful  swallow, OMEs etc.) to increase oropharyngeal strength/awareness/agility, tongue base retraction, hyolaryngeal excursion, airway protection, and/or bolus clearance with min visual, verbal and/or tactile cues in 4/5 trials.    Demonstrate the ability to adequately self-monitor swallowing skills and perform   appropriate compensatory techniques to reduce s/sx of aspiration and to safely consume least-restrictive diet with min verbal, visual and/or tactile cues in 4/5 trials.  Outcome: Progressing Towards Goal  SPEECH LANGUAGE PATHOLOGY DYSPHAGIA TREATMENT    Patient: Jeremy Walters (51 y.o. male)  Date: 07/06/2012  Diagnosis: delirium tremens, seizure d/o, subdural hematoma, nausea/vomiting  Alcohol withdrawal seizure  Alcohol withdrawal seizure Alcohol withdrawal seizure       Precautions: Aspiration Contact;Fall;Seizure      ASSESSMENT:   See daily treatment note above.   Progression toward goals:  [ ]          Improving appropriately and progressing toward goals  [X]          Improving slowly and progressing toward goals  [ ]          Not making progress toward goals and plan of care will be adjusted       PLAN:   Recommendations and Planned Interventions:  Patient continues to benefit from skilled intervention to address the above impairments. Continue treatment per established plan of care.  Discharge Recommendations:  Skilled Nursing Facility       SUBJECTIVE:   Patient stated ???ok, thanks???.      OBJECTIVE:   Cognitive and Communication Status:  Neurologic State: Drowsy  Orientation Level: Oriented X4  Cognition: Appropriate for age attention/concentration  Perception: Tactile;Verbal;Visual  Perseveration: No perseveration noted  Safety/Judgement: Decreased awareness of environment;Decreased awareness of need  for safety;Decreased awareness of need for assistance;Decreased insight into deficits;Fall prevention;Home safety  Dysphagia Treatment:  Oral Assessment:  Oral Assessment  Labial: Decreased seal  Dentition:  Natural  Oral Hygiene: fair  Lingual: Decreased rate  Velum: No impairment  Mandible: No impairment  P.O. Trials:  Patient Position: 40 hob  Vocal quality prior to P.O.: No impairment  Consistency Presented: Thin liquid;Puree;Mechanical soft  How Presented: SLP-fed/presented     Bolus Acceptance: No impairment  Bolus Formation/Control: Impaired  Type of Impairment: Delayed;Mastication  Propulsion: No impairment  Oral Residue: None  Initiation of Swallow: No impairment  Laryngeal Elevation: Functional  Aspiration Signs/Symptoms: None  Pharyngeal Phase Characteristics: No impairment, issues, or problems   Effective Modifications: Alternate liquids/solids;Small sips and bites  Cues for Modifications: Moderate       Oral Phase Severity: Mild  Pharyngeal Phase Severity : Mild     Pain:  Pain Scale 1: Numeric (0 - 10)  Pain Intensity 1: 4     After treatment:   [ ]               Patient left in no apparent distress sitting up in chair  [X]               Patient left in no apparent distress in bed  [X]               Call bell left within reach  [X]               Nursing notified  [X]               Caregiver present  [ ]               Bed alarm activated      COMMUNICATION/EDUCATION:   [X]           Aspiration precautions  Felecia Jan, SLP  Time Calculation: 30 mins

## 2012-07-06 NOTE — Other (Addendum)
received pt in bed awake, sitter at the bed side.denies pain.  2200  Pt sitting-up in chair. Sitter at the bed side.  07/07/12  0000  Back in bed. Alert & oriented. Denies pain. No seizures activity noted.  0300  Pt sleeping.

## 2012-07-06 NOTE — Consults (Signed)
Gastroenterology Consult    Patient: Jeremy Walters MRN: [id number]  SSN: [id number]    Date of Birth: Aug 13, 1961  Age: 51 y.o.  Sex: male      Subjective:      Jeremy Walters is a 51 y.o. year old Caucasian male who is being seen for anemia and elevated LFTs.  Full HPI -dictated  .  Hospital Problems  Never Reviewed        Codes Class Noted POA    Sepsis 995.91  07/05/2012 Unknown        *Alcohol withdrawal seizure 291.81  07/04/2012 Yes        Seizure 780.39  Unknown Yes    Overview    Signed 07/04/2012  9:20 AM by Jairo Ben, MD     keppra, dilantin, neurology           Thrombocytopenia, unspecified 287.5  07/04/2012 Yes    Overview    Addendum 07/04/2012  9:19 AM by Jairo Ben, MD     Due to alcoholic liver disease   Monitor           SVT (supraventricular tachycardia) 427.89  07/04/2012 Yes    Overview    Addendum 07/04/2012  9:46 AM by Jairo Ben, MD     Possibly due to Abnormal electrolyte,   cardiac enzymes negative echo BB          Subdural hematoma, 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 No evidence of acute hemorrhage or additional acute intracranial abnormality."            DTs (delirium tremens) 291.0  07/04/2012 Yes    Overview    Addendum 07/04/2012  9:43 AM by Jairo Ben, MD     Alcohol withdrawal protocol   UDS 07/04/12 negative           Melena 578.1  07/04/2012 Yes    Overview    Addendum 07/04/2012  4:20 PM by Jairo Ben, MD     Guaiac stool positive 07/04/12   Stool studies           Anemia, unspecified 285.9  07/04/2012 Yes    Overview    Signed 07/04/2012  9:39 AM by Jairo Ben, MD     Anemia profile           SIRS (systemic inflammatory response syndrome) 995.90  07/04/2012 Yes    Overview    Addendum 07/04/2012  9:47 AM by Jairo Ben, MD     Lactate 4.6  BCx 07/04/12 x 2  Fever tachycardic  tachypnea   Vancomycin/zosyn          Electrolyte abnormality 276.9  07/04/2012 Yes    Overview    Signed 07/04/2012  9:47 AM by Jairo Ben, MD     Hypokalemia hypophosphatemia               Past Medical History   Diagnosis Date   ??? Pancreatitis chronic    ??? Seizure    ??? Alcoholism      Past Surgical History   Procedure Date   ??? Hx gi      EGD      History reviewed. No pertinent family history.  History   Substance Use Topics   ??? Smoking status: Not on file   ??? Smokeless  tobacco: Not on file   ??? Alcohol Use: Yes      Current Facility-Administered Medications   Medication Dose Route Frequency Provider Last Rate Last Dose   ??? 0.9% sodium chloride infusion  100 mL/hr IntraVENous CONTINUOUS Galvin Proffer, MD 100 mL/hr at 07/06/12 0821 100 mL/hr at 07/06/12 0821   ??? acetaminophen (TYLENOL) tablet 650 mg  650 mg Oral Q4H PRN Thayer Ohm, MD   650 mg at 07/05/12 2111   ??? calcium gluconate 2 g in 0.9% sodium chloride 100 mL IVPB  2 g IntraVENous ONCE Galvin Proffer, MD 100 mL/hr at 07/05/12 1823 2 g at 07/05/12 1823   ??? potassium chloride 10 mEq in 100 ml IVPB  10 mEq IntraVENous Q1H Galvin Proffer, MD 100 mL/hr at 07/05/12 1717 10 mEq at 07/05/12 1717   ??? magnesium sulfate 2 g/50 ml IVPB (premix or compounded)  2 g IntraVENous ONCE Galvin Proffer, MD   2 g at 07/05/12 1538   ??? magnesium oxide (MAG-OX) tablet 400 mg  400 mg Oral BID Galvin Proffer, MD   400 mg at 07/06/12 1610   ??? Potassium & Sodium Phosphates (NEUTRA-PHOS) packet 1 Packet  1 Packet Oral Q2H Galvin Proffer, MD   1 Packet at 07/05/12 1709   ??? metroNIDAZOLE (FLAGYL) IVPB premix 500 mg  500 mg IntraVENous Q8H Galvin Proffer, MD   500 mg at 07/06/12 0329   ??? pantoprazole (PROTONIX) injection 40 mg  40 mg IntraVENous Q12H Galvin Proffer, MD   40 mg at 07/06/12 0925   ??? phenytoin ER (DILANTIN ER) ER capsule 200 mg  200 mg Oral ONCE Maggie Schwalbe, MD       ??? thiamine (B-1) tablet 100 mg  100 mg Oral DAILY Galvin Proffer, MD   100 mg at 07/06/12 9604   ??? folic acid (FOLVITE) tablet 1  mg  1 mg Oral DAILY Galvin Proffer, MD   1 mg at 07/06/12 5409   ??? potassium chloride 10 mEq in 100 ml IVPB  10 mEq IntraVENous Q1H Nurudeen S Lawani, DO 100 mL/hr at 07/05/12 1944 10 mEq at 07/05/12 1944   ??? metoprolol (LOPRESSOR) tablet 25 mg  25 mg Oral BID Nurudeen S Lawani, DO   25 mg at 07/06/12 0924   ??? ondansetron (ZOFRAN) injection 4 mg  4 mg IntraVENous Q4H PRN Nurudeen S Lawani, DO       ??? piperacillin-tazobactam (ZOSYN) 3.375 g in 0.9% sodium chloride (MBP/ADV) 100 mL MBP  3.375 g IntraVENous Q6H Nurudeen S Lawani, DO   3.375 g at 07/06/12 0452   ??? acetaminophen (TYLENOL) suppository 650 mg  650 mg Rectal Q6H PRN Lovie Macadamia, PA   650 mg at 07/04/12 0539   ??? sodium chloride (NS) flush 5-10 mL  5-10 mL IntraVENous Q8H Jairo Ben, MD   10 mL at 07/06/12 8119   ??? sodium chloride (NS) flush 5-10 mL  5-10 mL IntraVENous PRN Jairo Ben, MD       ??? haloperidol lactate (HALDOL) injection 1 mg  1 mg IntraVENous Q2H PRN Jairo Ben, MD       ??? LORazepam (ATIVAN) tablet 1-2 mg  1-2 mg Oral Q1H PRN Jairo Ben, MD   1 mg at 07/05/12 2110   ??? LORazepam (ATIVAN) tablet 3-4 mg  3-4 mg Oral Q1H PRN Jairo Ben, MD       ??? chlordiazePOXIDE (LIBRIUM) capsule 10 mg  10 mg Oral QID Jairo Ben, MD   10 mg  at 07/06/12 0924   ??? vancomycin (VANCOCIN) 1,000 mg in 0.9% sodium chloride (MBP/ADV) 250 mL adv  1,000 mg IntraVENous Q18H Nurudeen S Lawani, DO   1,000 mg at 07/05/12 2339   ??? VANCOMYCIN INFORMATION NOTE   Other ONCE Nurudeen S Lawani, DO            Allergies   Allergen Reactions   ??? Morphine Rash and Nausea and Vomiting   ??? Shellfish Derived Rash and Nausea and Vomiting       Review of Systems:  Constitutional: positive for fatigue and malaise  Ears, Nose, Mouth, Throat, and Face: negative  Respiratory: positive for cough or wheezing  Cardiovascular: positive for chest pain  Neurological: positive for seizures  Behavioral/Psychiatric: positive for alcoholism    Objective:     Blood  pressure 145/102, pulse 91, temperature 99 ??F (37.2 ??C), resp. rate 18, height 5\' 6"  (1.676 m), weight 56.7 kg (125 lb), SpO2 100.00%.    Physical Exam:  GENERAL: alert, cooperative, no distress, appears stated age  THROAT & NECK: normal  LUNG: clear to auscultation bilaterally  HEART: regular rate and rhythm  ABDOMEN: soft, non-tender. Bowel sounds normal. No masses,  no organomegaly  EXTREMITIES:  extremities normal, atraumatic, no cyanosis or edema  SKIN: Normal.  RECTAL: deferred    Recent Results (from the past 24 hour(s))   RPR    Collection Time    07/05/12 12:00 PM       Component Value Range    RPR NONREACTIVE  NONREACTIVE   OCCULT BLOOD, STOOL    Collection Time    07/05/12 12:00 PM       Component Value Range    Occult blood, stool NEGATIVE   NEGATIVE   LIPASE    Collection Time    07/05/12 12:00 PM       Component Value Range    Lipase 50 (*) 73 - 393 U/L   AMYLASE    Collection Time    07/05/12 12:00 PM       Component Value Range    Amylase 30  25 - 115 U/L   HEMATOCRIT    Collection Time    07/05/12 12:00 PM       Component Value Range    HCT 25.9 (*) 36.0 - 48.0 %   HEMOGLOBIN    Collection Time    07/05/12 12:00 PM       Component Value Range    HGB 8.8 (*) 13.0 - 16.0 g/dL   HEMATOCRIT    Collection Time    07/05/12  7:30 PM       Component Value Range    HCT 26.3 (*) 36.0 - 48.0 %   HEMOGLOBIN    Collection Time    07/05/12  7:30 PM       Component Value Range    HGB 8.9 (*) 13.0 - 16.0 g/dL   URINALYSIS W/ RFLX MICROSCOPIC    Collection Time    07/06/12  3:25 AM       Component Value Range    Color YELLOW      Appearance CLEAR      Specific gravity 1.010  1.003 - 1.030      pH 6.5  5.0 - 8.0      Protein NEGATIVE   NEGATIVE MG/DL    Glucose NEGATIVE   NEGATIVE MG/DL    Ketone NEGATIVE   NEGATIVE MG/DL    Bilirubin NEGATIVE   NEGATIVE  Blood TRACE (*) NEGATIVE    Urobilinogen 0.2  0.2 - 1.0 EU/DL    Nitrites NEGATIVE   NEGATIVE    Leukocyte Esterase NEGATIVE   NEGATIVE   URINE MICROSCOPIC ONLY     Collection Time    07/06/12  3:25 AM       Component Value Range    WBC NONE  0 - 4 /HPF    RBC 0 to 3  0 - 5 /HPF    Epithelial cells FEW  0 - 5 /LPF   PROTHROMBIN TIME    Collection Time    07/06/12  5:10 AM       Component Value Range    Prothrombin time 12.1  11.5 - 15.2 sec    INR 1.0  0.8 - 1.2     AMMONIA    Collection Time    07/06/12  5:10 AM       Component Value Range    Ammonia 47 (*) 11 - 32 UMOL/L   CBC WITH AUTOMATED DIFF    Collection Time    07/06/12  5:10 AM       Component Value Range    WBC 3.6 (*) 4.6 - 13.2 K/uL    RBC 3.03 (*) 4.70 - 5.50 M/uL    HGB 9.3 (*) 13.0 - 16.0 g/dL    HCT 96.0 (*) 45.4 - 48.0 %    MCV 88.1  74.0 - 97.0 FL    MCH 30.7  24.0 - 34.0 PG    MCHC 34.8  31.0 - 37.0 g/dL    RDW 09.8  11.9 - 14.7 %    PLATELET 51 (*) 135 - 420 K/uL    MPV 11.4  9.2 - 11.8 FL    NEUTROPHILS 65  40 - 73 %    LYMPHOCYTES 20 (*) 21 - 52 %    MONOCYTES 13 (*) 3 - 10 %    EOSINOPHILS 1  0 - 5 %    BASOPHILS 1  0 - 2 %    ABS. NEUTROPHILS 2.4  1.8 - 8.0 K/UL    ABS. LYMPHOCYTES 0.7 (*) 0.9 - 3.6 K/UL    ABS. MONOCYTES 0.5  0.05 - 1.2 K/UL    ABS. EOSINOPHILS 0.0  0.0 - 0.4 K/UL    ABS. BASOPHILS 0.0  0.0 - 0.06 K/UL    DF AUTOMATED     METABOLIC PANEL, COMPREHENSIVE    Collection Time    07/06/12  5:10 AM       Component Value Range    Sodium 139  136 - 145 MMOL/L    Potassium 3.7  3.5 - 5.5 MMOL/L    Chloride 105  100 - 108 MMOL/L    CO2 26  21 - 32 MMOL/L    Anion gap 8  3.0 - 18 mmol/L    Glucose 110 (*) 74 - 99 MG/DL    BUN 3 (*) 7.0 - 18 MG/DL    Creatinine 8.29  0.6 - 1.3 MG/DL    BUN/Creatinine ratio 5 (*) 12 - 20      GFR est-AA >60  >60 ml/min/1.80m2    GFR est non-AA >60  >60 ml/min/1.56m2    Calcium 7.1 (*) 8.5 - 10.1 MG/DL    Bilirubin, total 0.9  0.2 - 1.0 MG/DL    ALT 32  56.2 - 13.0 U/L    AST 64 (*) 15 - 37 U/L    Alk. phosphatase 228 (*) 50 - 136  U/L    Protein, total 4.3 (*) 6.4 - 8.2 g/dL    Albumin 1.8 (*) 3.4 - 5.0 g/dL    Globulin 2.5  2.0 - 4.0 g/dL    A-G Ratio 0.7 (*) 0.8 - 1.7      FIBRINOGEN    Collection Time    07/06/12  5:10 AM       Component Value Range    Fibrinogen 340  210 - 451 mg/dL   PTT    Collection Time    07/06/12  5:10 AM       Component Value Range    aPTT 30.2  24.6 - 37.7 SEC   BILIRUBIN, DIRECT    Collection Time    07/06/12  5:10 AM       Component Value Range    Bilirubin, direct 0.4 (*) 0.0 - 0.2 MG/DL       Assessment:   1. Alcohol abuse- pt continues to drink heavily  2.Seizure disorder - likely related to alcohol withdrawal  3. Anemia/pancytopenia- likely related to alcohol  4. Elevated LFTs- suspect alcoholic liver disease  5. Gerd - chronic, ro stricture  6. Hx of pancreatitis, s/p pseudo cyst drainage- stable  7.Hypertension  8. Gram Neg sepsis- ? etiology  9. Chronic subdural hematoma      Plan:   1. RUQ u/s vs. CT Abd  2. Check Hepatitis profile and alpha feto protein  3. PPI for Gerd  4. Screening colonoscopy- outpt  5. Monitor Hgb/HCT      Signed By: Raynelle Jan, MD     July 06, 2012

## 2012-07-06 NOTE — Progress Notes (Signed)
Date: 07/06/2012  Provider: Chalmers Cater, M.D.  Neurology      ACTIVE PROBLEMS:  Seizure disorder  alcoholism        Subjective: lless dizzy  Since dilantin was held yesterday;  Recalls taking 100-200 mg/d as his baseline dose    Objective:    DPH 11    SOCIAL HISTORY:  Unchanged     ALLERGIES:  ???  No Known Allergies    FAMILY HISTORY:  Family history unchanged    PHYSICAL FINDINGS:     The patient was alert, cooperative and   in no distress.  Speech was normal.  Affect was normal.  Cranial nerves 2-12 were intact.  Eye movements were full and conjugate, pursuit movements were smooth, saccades were accurate and there was marked high-amplitude nystagmus.  There was no dysarthria or dysphagia.    Motor examination revealed normal tone and bulk throughout.  Reflexes were 2+ and symmetric.  P There was no pronator drift.   FTN/HTS coordination was normal.  Gait and stance were  Not tested.    ASSESSMENT:    Clinically dilantin toxic with level of 11.1 and albumen of 2        Plan:  Reduce dilantin dose to 200 mg /day po starting this evening  Dc IV dilantin now   free and total dilantin levels pending  Need dc planning input re:  Finding affordable supply of medication    Chalmers Cater, M.D.

## 2012-07-06 NOTE — Progress Notes (Signed)
Daily Progress Note    Patient: Jeremy Walters   Age:  51 y.o.  DOA: 07/03/2012   Admit Dx / CC: delirium tremens, seizure d/o, subdural hematoma, nausea/vomiting  Alcohol withdrawal seizure  Alcohol withdrawal seizure  LOS:  LOS: 3 days       Assessment/ Plan:  Hospital Problems  Never Reviewed        Codes Class Noted POA    Sepsis 995.91  07/05/2012 Unknown        *Alcohol withdrawal seizure 291.81  07/04/2012 Yes        Seizure 780.39  Unknown Yes    Overview         keppra, dilantin, neurology           Thrombocytopenia, unspecified 287.5  07/04/2012 Yes    Overview         Due to alcoholic liver disease   Monitor           SVT (supraventricular tachycardia) 427.89  07/04/2012 Yes    Overview         Possibly due to Abnormal electrolyte,   cardiac enzymes negative echo BB          Subdural hematoma, chronic 432.1  07/04/2012 Yes    Overview         Head CT 07/03/12,"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 No evidence of acute hemorrhage or additional acute intracranial abnormality."            DTs (delirium tremens) 291.0  07/04/2012 Yes    Overview         Alcohol withdrawal protocol   UDS 07/04/12 negative           Melena 578.1  07/04/2012 Yes    Overview         Guaiac stool positive 07/04/12   Stool studies           Anemia, unspecified 285.9  07/04/2012 Yes    Overview         Anemia profile           SIRS (systemic inflammatory response syndrome) 995.90  07/04/2012 Yes    Overview         Lactate 4.6  BCx 07/04/12 x 2  Fever tachycardic tachypnea   Vancomycin/zosyn          Electrolyte abnormality 276.9  07/04/2012 Yes    Overview         Hypokalemia hypophosphatemia               Fever x 1  Coarse breath sounds" pending cx-r today    1. sz with non compliance with meds:  Neuro following  No sz x 48 hr,  On dilantin only due to expensive keppra and non compliance it was d/ced . dilantin dose reduced. Will follow with neuro about low platlelt count and meds  that will worsen it      Alcohol use: per neuro not alcohol WD sz  monitor for wd, agitation last night otherwise ok today  Banana bag changed to po due to mulitple IV lines for lytes replacements  Alcohol liver dx that is contributing to low platelet count, abnormal LFT.  pending hep panel    Bacteremia;SIRS (systemic inflammatory response syndrome AEROBIC AND ANAEROBIC BOTTLES ESCHERICHIA COLI     ID consulted. Dr. Gloris Manchester   platlets count down, need to consider diff abx to avoid more low counts  On isolation for c.diff pending  rpts for diarrhea  Added flagyl day 2        Abnormal LFT likely from alcohol liver dx, improving  CK: improving,     H/o pancreatitis:  labs lipase,amylase ok    GI:  Thrombocytopenia, unspecified, coags normal,  Multifactorial from alcohol, liver dx, sz meds: better today, seen by onco, pending labs.  Anemia, unspecified - stable h and H  Stool pos for blood x 1 , neg yesterday    - GI consult, Dr. Adelene Idler consulted,   - monitor for bleeding or brusies,     SVT (supraventricular tachycardia: was thought to be sec to abnormal lytes, correction in place, echo wnl    Subdural hematoma, chronic  Developing per 8/30 ct, CT head pending  DTs (delirium tremens): no active DT's    cx-r: nodule: need pa view, ordered to clarify    DVT prophylaxis SCDs  Disposition home with home health       Subjective:   Pt states feel better then yesterday, eating better, more energy. No bleeding or busies obivious.  He looks little stronger clincially.    Review of Systems - History obtained from the patient  General ROS: positive for  - fatigue and malaise  Psychological ROS: negative  Ophthalmic ROS: negative  ENT ROS: negative  Allergy and Immunology ROS: negative  Hematological and Lymphatic ROS: negative  Endocrine ROS: negative  Respiratory ROS: no cough, shortness of breath, or wheezing  Cardiovascular ROS: no chest pain or dyspnea on exertion  Gastrointestinal ROS: no abdominal pain, change in bowel  habits, or black or bloody stools  Genito-Urinary ROS: no dysuria, trouble voiding, or hematuria  Musculoskeletal ROS: negative  Neurological ROS: no TIA or stroke symptoms  Dermatological ROS: negative        Objective:       Patient Vitals for the past 24 hrs:   Temp Pulse Resp BP SpO2   07/06/12 0355 98.2 ??F (36.8 ??C) 95  18  129/95 mmHg 100 %   07/05/12 2341 98.4 ??F (36.9 ??C) 97  20  125/85 mmHg 97 %   07/05/12 2016 100.2 ??F (37.9 ??C) 87  18  134/93 mmHg 99 %   07/05/12 1833 99.7 ??F (37.6 ??C) 94  18  127/94 mmHg 99 %   07/05/12 1554 98.8 ??F (37.1 ??C) 92  20  128/90 mmHg 100 %   07/05/12 1157 99.2 ??F (37.3 ??C) 89  20  114/86 mmHg 100 %   07/05/12 0937 99.1 ??F (37.3 ??C) 107  20  133/82 mmHg 100 %   SUMMARY:  Left ventricle: Systolic function was normal by visual assessment.  Ejection fraction was estimated in the range of 55 % to 60 %. No obvious  wall motion abnormalities identified in the views obtained. Wall thickness  was mildly increased. There was mild concentric hypertrophy.          Physical Exam:  General appearance: Alert, cooperative, no distress, appears stated age  Head: Normocephalic, without obvious abnormality, atraumatic  Neck: Supple, no lymphadenopathy or thyromegaly, trachea midline  Lungs: Coarse BL sounds  Heart: Regular rate and rhythm, S1, S2 normal, no murmur, click, rub or gallop  Abdomen: Soft, non-tender and not-distended. Bowel sounds normal. No masses, no organomegaly  Extremities: Extremities normal, atraumatic, no cyanosis or edema  Skin: Skin color, texture, turgor normal. No rashes or lesions  Neurologic: Grossly normal and non focal, normal muscle tone and power, cranial nerves are intact, normal sensation, AOx3  Intake and Output:  Current Shift:  09/01 0700 - 09/01 1859  In: -   Out: 200 [Drains:200]  Last three shifts:  08/30 1900 - 09/01 0659  In: 5410 [P.O.:2620; I.V.:2790]  Out: 7200 [Urine:6300; Drains:900]          Galvin Proffer, MD  07/06/2012      Hospitalist Service   [facility]   [facility]  [phone] office  Pager [phone]

## 2012-07-06 NOTE — Consults (Signed)
[facility]                  [address], [place], [place]  [address]                                CONSULTATION REPORT    PATIENT:       Jeremy Walters, Jeremy Walters  MRN:               [id number]     ADMITTED:   07/03/2012  CSN:               [id number]    CONSULTED:  07/06/2012  LOCATION:  ATTENDING:     Fredrik Cove, DO  CONSULTING:    Raynelle Jan, MD        PATIENT LOCATION: Room 2104    REASON FOR CONSULTATION: Anemia, increased LFTs.    HISTORY OF PRESENT ILLNESS: The patient is a 51 year old, white male with a  history of alcohol abuse, who was admitted to the hospital on 07/03/2012  after presenting to the emergency room with a seizure. The patient was  quite obtunded, incoherent at the time of presentation. He is more coherent  and oriented today. He admits to being a heavy drinker at times, drinking a  half a gallon of wine per day. He does complain of some nausea and dry  heaves. He denies vomiting or hematemesis. He does complain of intermittent  solid food dysphagia. He does have a history of heartburn and GERD. There  is no history of peptic ulcer disease, jaundice, hepatitis, chronic liver  disease. He does have a history of pancreatitis and in fact, had a  pseudocyst drained endoscopically by Dr. Danelle Earthly about 7 or 8 years ago. He  does complain of mild diffuse abdominal discomfort. He describes  intermittent diarrhea and constipation. He denies melena or hematochezia.  As best I can tell, he has never had a colonoscopy. He was noted to have  Hemoccult-positive stool on admission, but subsequent stools have been  Hemoccult negative. He is known to have a chronic anemia. He was also noted  to have a chronic subdural hematoma on CT scan of the head at the time of  admission. He was also noted to have electrolyte imbalance. I am now asked  to see the patient for further evaluation of the above problems.    OPERATIONS: Hernia repair, foot surgery,  hemorrhoidectomy, and endoscopic  drainage of pseudocyst.                      Raynelle Jan, MD      AJG:wmx  D: 07/06/2012 09:48 A T: 07/06/2012 10:41 A  Job #:  [id number]  CScriptDoc #:  [id number]  cc:   Raynelle Jan, MD        Fredrik Cove, DO

## 2012-07-06 NOTE — Other (Signed)
Bedside and Verbal shift change report given to Dora Sosa RN (oncoming nurse) by BOBBY W CHERRY, RN   (offgoing nurse).  Report given with SBAR, Kardex, MAR and Recent Results.

## 2012-07-07 LAB — METABOLIC PANEL, BASIC
Anion gap: 9 mmol/L (ref 3.0–18)
BUN/Creatinine ratio: 3 — ABNORMAL LOW (ref 12–20)
BUN: 2 MG/DL — ABNORMAL LOW (ref 7.0–18)
CO2: 25 MMOL/L (ref 21–32)
Calcium: 7.2 MG/DL — ABNORMAL LOW (ref 8.5–10.1)
Chloride: 102 MMOL/L (ref 100–108)
Creatinine: 0.62 MG/DL (ref 0.6–1.3)
GFR est AA: >60 mL/min/{1.73_m2} (ref >60)
GFR est non-AA: >60 mL/min/{1.73_m2} (ref >60)
Glucose: 104 MG/DL — ABNORMAL HIGH (ref 74–99)
Potassium: 3.2 MMOL/L — ABNORMAL LOW (ref 3.5–5.5)
Sodium: 136 MMOL/L (ref 136–145)

## 2012-07-07 LAB — CBC WITH AUTOMATED DIFF
ABS. BASOPHILS: 0 10*3/uL (ref 0.0–0.06)
ABS. EOSINOPHILS: 0.1 10*3/uL (ref 0.0–0.4)
ABS. LYMPHOCYTES: 1 10*3/uL (ref 0.9–3.6)
ABS. MONOCYTES: 0.6 10*3/uL (ref 0.05–1.2)
ABS. NEUTROPHILS: 2.3 10*3/uL (ref 1.8–8.0)
BASOPHILS: 1 % (ref 0–2)
EOSINOPHILS: 1 % (ref 0–5)
HCT: 25.1 % — ABNORMAL LOW (ref 36.0–48.0)
HGB: 8.8 g/dL — ABNORMAL LOW (ref 13.0–16.0)
LYMPHOCYTES: 25 % (ref 21–52)
MCH: 30.8 PG (ref 24.0–34.0)
MCHC: 35.1 g/dL (ref 31.0–37.0)
MCV: 87.8 FL (ref 74.0–97.0)
MONOCYTES: 16 % — ABNORMAL HIGH (ref 3–10)
MPV: 10.9 FL (ref 9.2–11.8)
NEUTROPHILS: 57 % (ref 40–73)
PLATELET: 72 10*3/uL — ABNORMAL LOW (ref 135–420)
RBC: 2.86 M/uL — ABNORMAL LOW (ref 4.70–5.50)
RDW: 13.9 % (ref 11.6–14.5)
WBC: 3.9 10*3/uL — ABNORMAL LOW (ref 4.6–13.2)

## 2012-07-07 LAB — HEPATITIS PANEL, ACUTE
Hep B surface Ag Interp.: NEGATIVE
Hep C virus Ab Interp.: NEGATIVE
Hepatitis A, IgM: NEGATIVE
Hepatitis B core, IgM: NEGATIVE
Hepatitis B surface Ag: <0.1 Index (ref <1.00)
Hepatitis C virus Ab: 0.08 Index (ref <0.80)

## 2012-07-07 LAB — MAGNESIUM: Magnesium: 1.1 MG/DL — ABNORMAL LOW (ref 1.8–2.4)

## 2012-07-07 MED ADMIN — sodium chloride (NS) flush 5-10 mL: INTRAVENOUS | @ 02:00:00 | NDC 87701099893

## 2012-07-07 MED ADMIN — 0.9% sodium chloride infusion: INTRAVENOUS | @ 02:00:00 | NDC 00409798309

## 2012-07-07 MED ADMIN — chlordiazePOXIDE (LIBRIUM) capsule 10 mg: ORAL | @ 13:00:00 | NDC 51079037401

## 2012-07-07 MED ADMIN — 0.9% sodium chloride infusion: INTRAVENOUS | @ 17:00:00 | NDC 00409798309

## 2012-07-07 MED ADMIN — chlordiazePOXIDE (LIBRIUM) capsule 10 mg: ORAL | @ 17:00:00 | NDC 51079037401

## 2012-07-07 MED ADMIN — magnesium oxide (MAG-OX) tablet 400 mg: ORAL | @ 21:00:00 | NDC 96295013573

## 2012-07-07 MED ADMIN — potassium chloride 10 mEq in 100 ml IVPB: INTRAVENOUS | @ 20:00:00 | NDC 00338070948

## 2012-07-07 MED ADMIN — ceFAZolin (ANCEF) 1 g in 0.9% sodium chloride (MBP/ADV) 50 mL MBP: INTRAVENOUS | @ 21:00:00 | NDC 00781345170

## 2012-07-07 MED ADMIN — pantoprazole (PROTONIX) injection 40 mg: INTRAVENOUS | @ 02:00:00 | NDC 00008400101

## 2012-07-07 MED ADMIN — ceFAZolin (ANCEF) 1 g in 0.9% sodium chloride (MBP/ADV) 50 mL MBP: INTRAVENOUS | @ 06:00:00 | NDC 00781345170

## 2012-07-07 MED ADMIN — chlordiazePOXIDE (LIBRIUM) capsule 10 mg: ORAL | @ 21:00:00 | NDC 51079037401

## 2012-07-07 MED ADMIN — sodium chloride (NS) flush 5-10 mL: INTRAVENOUS | @ 18:00:00 | NDC 87701099893

## 2012-07-07 MED ADMIN — chlordiazePOXIDE (LIBRIUM) capsule 10 mg: ORAL | @ 02:00:00 | NDC 51079037401

## 2012-07-07 MED ADMIN — magnesium oxide (MAG-OX) tablet 400 mg: ORAL | @ 17:00:00 | NDC 96295013573

## 2012-07-07 MED ADMIN — pantoprazole (PROTONIX) injection 40 mg: INTRAVENOUS | @ 13:00:00 | NDC 00008400101

## 2012-07-07 MED ADMIN — potassium chloride 10 mEq in 100 ml IVPB: INTRAVENOUS | @ 17:00:00 | NDC 00338070948

## 2012-07-07 MED ADMIN — 0.9% sodium chloride infusion: INTRAVENOUS | @ 13:00:00 | NDC 00409798309

## 2012-07-07 MED ADMIN — potassium chloride 10 mEq in 100 ml IVPB: INTRAVENOUS | @ 18:00:00 | NDC 00338070948

## 2012-07-07 MED ADMIN — thiamine (B-1) tablet 100 mg: ORAL | @ 13:00:00 | NDC 90011015050

## 2012-07-07 MED ADMIN — ceFAZolin (ANCEF) 1 g in 0.9% sodium chloride (MBP/ADV) 50 mL MBP: INTRAVENOUS | @ 13:00:00 | NDC 00781345170

## 2012-07-07 MED ADMIN — folic acid (FOLVITE) tablet 1 mg: ORAL | @ 13:00:00 | NDC 62584089711

## 2012-07-07 MED ADMIN — metoprolol (LOPRESSOR) tablet 25 mg: ORAL | @ 13:00:00 | NDC 62584026511

## 2012-07-07 MED ADMIN — potassium chloride (K-DUR, KLOR-CON) SR tablet 40 mEq: ORAL | @ 17:00:00 | NDC 68084036011

## 2012-07-07 MED ADMIN — potassium chloride 10 mEq in 100 ml IVPB: INTRAVENOUS | @ 19:00:00 | NDC 00338070948

## 2012-07-07 MED ADMIN — sodium chloride (NS) flush 5-10 mL: INTRAVENOUS | @ 10:00:00 | NDC 87701099893

## 2012-07-07 MED ADMIN — metoprolol (LOPRESSOR) tablet 25 mg: ORAL | @ 21:00:00 | NDC 62584026511

## 2012-07-07 MED FILL — SODIUM CHLORIDE 0.9 % IV: INTRAVENOUS | Qty: 1000

## 2012-07-07 MED FILL — CHLORDIAZEPOXIDE 5 MG CAP: 5 mg | ORAL | Qty: 2

## 2012-07-07 MED FILL — FOLIC ACID 1 MG TAB: 1 mg | ORAL | Qty: 1

## 2012-07-07 MED FILL — METOPROLOL TARTRATE 25 MG TAB: 25 mg | ORAL | Qty: 1

## 2012-07-07 MED FILL — PROTONIX 40 MG INTRAVENOUS SOLUTION: 40 mg | INTRAVENOUS | Qty: 40

## 2012-07-07 MED FILL — POTASSIUM CHLORIDE 10 MEQ/100 ML IV PIGGY BACK: 10 mEq/0 mL | INTRAVENOUS | Qty: 100

## 2012-07-07 MED FILL — MAGNESIUM OXIDE 400 MG TAB: 400 mg | ORAL | Qty: 1

## 2012-07-07 MED FILL — CEFAZOLIN 1 GRAM SOLUTION FOR INJECTION: 1 gram | INTRAMUSCULAR | Qty: 1000

## 2012-07-07 MED FILL — VITAMIN B-1 100 MG TABLET: 100 mg | ORAL | Qty: 1

## 2012-07-07 MED FILL — POTASSIUM CHLORIDE SR 20 MEQ TAB, PARTICLES/CRYSTALS: 20 mEq | ORAL | Qty: 2

## 2012-07-07 NOTE — Progress Notes (Signed)
Problem: Mobility Impaired (Adult and Pediatric)  Goal: *Acute Goals and Plan of Care (Insert Text)  Physical Therapy Goals  Initiated 07/04/2012 and to be accomplished within 7 day(s)  1. Patient will move from supine to sit and sit to supine and roll side to side in bed with independence.   2. Patient will transfer from bed to chair and chair to bed with modified independence using the least restrictive device.  3. Patient will perform sit to stand with modified independence.  4. Patient will ambulate with modified independence for 100 feet with the least restrictive device.   5. Patient will ascend/descend 3 stairs with 1 handrail(s) with minimal assistance/contact guard assist.   PHYSICAL THERAPY TREATMENT    Patient: Jeremy Walters (51 y.o. male)  Date: 07/07/2012  Diagnosis: delirium tremens, seizure d/o, subdural hematoma, nausea/vomiting  Alcohol withdrawal seizure  Alcohol withdrawal seizure Alcohol withdrawal seizure  Precautions: Fall;Seizure;Contact  Chart, physical therapy assessment, plan of care and goals were reviewed.      ASSESSMENT:   Pt is progressing appropriately towards reaching functional goals with overall improvement in functional mobility during bed mobility training and transfers activities. Unable to perform gait training session today and was limited to only supine/standing there-ex due to leaking FMS. Nsg notified and pt left with caregiver at end of treatment session.     Progression toward goals:  [X]       Improving appropriately and progressing toward goals  [ ]       Improving slowly and progressing toward goals  [ ]       Not making progress toward goals and plan of care will be adjusted       PLAN:   Patient continues to benefit from skilled intervention to address the above impairments. Continue treatment per established plan of care.  Discharge Recommendations:  Home Health;To Be Determined  Further Equipment Recommendations for Discharge:  Rolling walker       SUBJECTIVE:    Patient stated "I'm feeling pretty good".      OBJECTIVE DATA SUMMARY:   Critical Behavior:  Neurologic State: Alert  Orientation Level: Oriented X4  Cognition: Appropriate for age attention/concentration;Follows commands  Safety/Judgement: Decreased awareness of environment;Decreased awareness of need for assistance;Decreased awareness of need for safety;Home safety;Fall prevention  Functional Mobility Training:  Bed Mobility:  Supine to Sit: Additional time;Minimal assistance;Safety concerns;Verbal cues  Sit to Supine: Additional time;CGA;Safety concerns;Verbal cues  Transfers:  Sit to Stand: Additional time;Assist X1;Minimum assistance;Safety concerns;Verbal cues  Stand to Sit: Additional time;Assist X1;CGA;Safety concerns;Verbal cues  Balance:  Sitting: Impaired  Sitting - Static: Fair (occasional)  Sitting - Dynamic: Fair (occasional)  Standing: Impaired  Standing - Static: Fair  Standing - Dynamic : Fair  Ambulation/Gait Training:  Stairs:  Neuro Re-Education:  Therapeutic Exercises:   Pt completed supine/standing there-ex: ankle pumps x20, heel slides, heel/toe raises, standing marches, and 3-way hip x10 to increase overall LE ROM, strength, and endurance.   Pain:  Pain Scale 1: Numeric (0 - 10)  Pain Intensity 1: 0  Activity Tolerance:   Fair tolerance to all activities.  Please refer to the flowsheet for vital signs taken during this treatment.  After treatment:   [ ]  Patient left in no apparent distress sitting up in chair  [X]  Patient left in no apparent distress in bed  [X]  Call bell left within reach  [X]  Nursing notified  [X]  Caregiver present  [ ]  Bed alarm activated      Joseph Berkshire, PTA  Time Calculation: 23 mins

## 2012-07-07 NOTE — Progress Notes (Addendum)
Progress Note    Patient: Jeremy Walters MRN: [id number]  SSN: [id number]    Date of Birth: 01/01/1961  Age: 51 y.o.  Sex: male      Admit Date: 07/03/2012    LOS: 4 days     Subjective:   Sleeping quietly  Feels better, clinically looks better.   Tolerated diet      Objective:     Current Facility-Administered Medications   Medication Dose Route Frequency   ??? 0.9% sodium chloride infusion  100 mL/hr IntraVENous CONTINUOUS   ??? ceFAZolin (ANCEF) 1 g in 0.9% sodium chloride (MBP/ADV) 50 mL MBP  1 g IntraVENous Q8H   ??? acetaminophen (TYLENOL) tablet 650 mg  650 mg Oral Q4H PRN   ??? magnesium oxide (MAG-OX) tablet 400 mg  400 mg Oral BID   ??? pantoprazole (PROTONIX) injection 40 mg  40 mg IntraVENous Q12H   ??? phenytoin ER (DILANTIN ER) ER capsule 200 mg  200 mg Oral ONCE   ??? thiamine (B-1) tablet 100 mg  100 mg Oral DAILY   ??? folic acid (FOLVITE) tablet 1 mg  1 mg Oral DAILY   ??? metoprolol (LOPRESSOR) tablet 25 mg  25 mg Oral BID   ??? ondansetron (ZOFRAN) injection 4 mg  4 mg IntraVENous Q4H PRN   ??? acetaminophen (TYLENOL) suppository 650 mg  650 mg Rectal Q6H PRN   ??? sodium chloride (NS) flush 5-10 mL  5-10 mL IntraVENous Q8H   ??? sodium chloride (NS) flush 5-10 mL  5-10 mL IntraVENous PRN   ??? haloperidol lactate (HALDOL) injection 1 mg  1 mg IntraVENous Q2H PRN   ??? LORazepam (ATIVAN) tablet 1-2 mg  1-2 mg Oral Q1H PRN   ??? LORazepam (ATIVAN) tablet 3-4 mg  3-4 mg Oral Q1H PRN   ??? chlordiazePOXIDE (LIBRIUM) capsule 10 mg  10 mg Oral QID       Filed Vitals:    07/06/12 1930 07/07/12 0003 07/07/12 0528 07/07/12 0740   BP: 116/83 108/77 128/94 128/92   Pulse: 94 96 96 97   Temp: 99 ??F (37.2 ??C) 98.6 ??F (37 ??C) 98.6 ??F (37 ??C) 98.4 ??F (36.9 ??C)   Resp: 18 18 18 18    Height:       Weight:       SpO2: 100% 100% 100% 100%            Intake and Output:  Current Shift: 09/02 0700 - 09/02 1859  In: 480 [P.O.:480]  Out: -   Last three shifts: 08/31 1900 - 09/02 0659  In: 5725 [P.O.:1120; I.V.:4605]  Out: 7500 [Urine:6350;  Drains:1150]    General A+Ox3; NAD  Head: Normocephalic, without obvious abnormality, atraumatic   Neck: supple, trachea midline   Lungs: clear to auscultation bilaterally   Heart: regular rate and rhythm, S1, S2 normal, no murmur, click, rub or gallop   Abdomen: soft, non-tender. Bowel sounds normal. Extremities: extremities normal, atraumatic, no cyanosis or edema   Skin: Skin color, texture, turgor normal. No rashes or lesions   Neurologic: Grossly normal      Lab/Data Review:    Recent Labs   Basename 07/07/12 0630 07/06/12 0510 07/05/12 1930 07/05/12 0450    WBC 3.9* 3.6* -- 3.9*    HGB 8.8* 9.3* 8.9* --    HCT 25.1* 26.7* 26.3* --    PLT 72* 51* -- 38*     Recent Labs   Basename 07/07/12 0630 07/06/12 0510 07/05/12 0450 07/04/12 2030  NA 136 139 139 --    K 3.2* 3.7 2.6* --    CL 102 105 101 --    CO2 25 26 28  --    GLU 104* 110* 101* --    BUN 2* 3* 9 --    CREA 0.62 0.65 0.78 --    CA 7.2* 7.1* 7.1* --    MG -- -- 1.5* --    PHOS -- -- 2.2* 3.0    ALB -- 1.8* 2.0* --    TBIL -- 0.9 1.0 --    SGOT -- 64* 109* --    INR -- 1.0 -- --     No results found for this basename: PH:3,PCO2:3,PO2:3,HCO3:3,FIO2:3 in the last 72 hours    SUMMARY:  Left ventricle: Systolic function was normal by visual assessment.  Ejection fraction was estimated in the range of 55 % to 60 %. No obvious  wall motion abnormalities identified in the views obtained. Wall thickness  was mildly increased. There was mild concentric hypertrophy.          Assessment/Plan:     IMPRESSION:       Patient Active Problem List   Diagnosis Code   ??? Alcohol withdrawal seizure 291.81   ??? Seizure 780.39   ??? Thrombocytopenia, unspecified 287.5   ??? SVT (supraventricular tachycardia) 427.89   ??? Subdural hematoma, chronic 432.1   ??? DTs (delirium tremens) 291.0   ??? Melena 578.1   ??? Anemia, unspecified 285.9   ??? SIRS (systemic inflammatory response syndrome) 995.90   ??? Electrolyte abnormality 276.9   ??? Sepsis 995.91      PLAN:   NO fevers  1. sz with non  compliance with meds:  Neuro following  No sz x more then 48 hr,  On dilantin only due to expensive keppra and non compliance it was d/ced . dilantin dose reduced. 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  pending hep panel, and HIV  Bacteremia;SIRS (systemic inflammatory response syndrome AEROBIC AND ANAEROBIC BOTTLES ESCHERICHIA COLI   ID followingDr. Gloris Manchester   platlets count down, need to consider diff abx to avoid more low counts  On isolation for c.diff pending rpts for diarrhea  On Ancef    Abnormal LFT likely from alcohol liver dx, improving  CK: improving,     H/o pancreatitis:  labs lipase,amylase ok    GI:  Thrombocytopenia, unspecified, coags normal,  Multifactorial from alcohol, liver dx, sz meds: better today, seen by onco Dr. Cyndra Numbers, pending labs by onco--improving  Anemia, unspecified - stable h and H  Stool pos for blood x 1 , neg rpt one    - GI consult, Dr. Adelene Idler following  - monitor for bleeding or brusies,     SVT (supraventricular tachycardia: was thought to be sec to abnormal lytes, correction in place, echo wnl    Subdural hematoma, chronic  Developing per 8/30 ct, CT head 9/1- Predominantly old appearing left frontal subdural hygroma. Monitor clinically and outpt f/u    DTs (delirium tremens): no active DT's    cx-r: nodule: Poorly defined subcentimeter nodule in the right upper lobe  To f.u outpt    Low K; replace    DVT  prophylaxis SCDs  Disposition home with home health /snf, CM on    Assessment:      BSI-E. Coli 3 of 3   With positive GIB suspect GI source  Ancef   H/o murmur   > 2 D echo   GI workup per GI    Sepsis     GIB  Per GI    SZDZ   Thought to be ETOH withdrawal     Elevated NH3     plts down   ETOH liver  disease     SDH   Head CT 07/03/12,"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 No evidence of acute hemorrhage or additional acute intracranial abnormality     Chronic/acute alcoholism         Signed By: Galvin Proffer, MD     July 07, 2012    [facility]  [facility]  Pager: [phone]  Office: [phone]

## 2012-07-07 NOTE — Progress Notes (Signed)
Problem: Self Care Deficits Care Plan (Adult)  Goal: *Acute Goals and Plan of Care (Insert Text)  Occupational Therapy Goals  Initiated 07/04/2012 within 7 day(s).    1. Patient will perform grooming with supervision/set-up standing at sink for more than 5 minutes.  2. Patient will perform upper body dressing with independence.  3. Patient will perform lower body dressing with independence.  4. Patient will perform safe bed/chair to toilet transfers with supervision/set-up.  5. Patient will perform all aspects of toileting with supervision/set-up.  6. Patient will participate in upper extremity therapeutic exercise/activities with independence for 10 minutes.   7. Patient will utilize energy conservation techniques during functional activities with verbal, visual and tactile cues.   OCCUPATIONAL THERAPY TREATMENT    Patient: Jeremy Walters (51 y.o. male)  Date: 07/07/2012  Diagnosis: delirium tremens, seizure d/o, subdural hematoma, nausea/vomiting  Alcohol withdrawal seizure  Alcohol withdrawal seizure Alcohol withdrawal seizure       Precautions: Fall;Seizure;Contact  Chart, occupational therapy assessment, plan of care, and goals were reviewed.      ASSESSMENT:   Pt seen for ADL seated EOB requiring increase time secondary to c/o fatigue. Pt decrease standing balance requiring Mod Assist w/LB ADLs and ADL retraining for compensatory techniques.  Progression toward goals:  [ ]           Improving appropriately and progressing toward goals  [X]           Improving slowly and progressing toward goals  [ ]           Not making progress toward goals and plan of care will be adjusted       PLAN:   Patient continues to benefit from skilled intervention to address the above impairments. Continue treatment per established plan of care.  Discharge Recommendations:  Rehab vs home health  Further Equipment Recommendations for Discharge:  rolling walker       SUBJECTIVE:   Patient stated ???I've been having some crazy dreams.???       OBJECTIVE DATA SUMMARY:   Cognitive/Behavioral Status:  Neurologic State: Alert  Orientation Level: Oriented X4  Cognition: Appropriate for age attention/concentration;Follows commands  Safety/Judgement: Decreased awareness of environment;Decreased awareness of need for assistance;Decreased awareness of need for safety;Home safety;Fall prevention  Functional Mobility and Transfers for ADLs:              Bed Mobility:  Supine to Sit: Additional time;Minimal assistance;Safety concerns;Verbal cues  Sit to Supine: Additional time;CGA;Safety concerns;Verbal cues              Transfers:  Sit to Stand: Additional time;Minimum assistance;Safety concerns;Verbal cues (w/standard walker and 3 side steps to [facility])  Balance:  Sitting: Impaired  Sitting - Static: Fair (occasional)  Sitting - Dynamic: Fair (occasional)  Standing: Impaired;With support  Standing - Static: Fair  Standing - Dynamic : Fair  ADL Intervention:  Grooming  Washing Face: Stand-by assistance  Washing Hands: Stand-by assistance  Brushing Teeth: Stand-by assistance    Upper Body Bathing  Bathing Assistance: Stand-by assistance    Lower Body Bathing  Bathing Assistance: Moderate assistance    Upper Body Dressing Assistance  Hospital Gown: Minimum  assistance    Lower Body Dressing Assistance  Socks: Minimum assistance  Leg Crossed Method Used: Yes  Position Performed: Seated edge of bed  Cues: Verbal cues provided;Visual cues provided    Pain:  Pain Scale 1: Numeric (0 - 10)  Pain Intensity 1: 0    Activity Tolerance:  Fair, pt fatigues easily    Please refer to the flowsheet for vital signs taken during this treatment.  After treatment:   [ ]   Patient left in no apparent distress sitting up in chair  [X]   Patient left in no apparent distress in bed  [X]   Call bell left within reach  [ ]   Nursing notified  [X]   sitter present  [ ]   Bed alarm activated    Leann  Dextradeur, COTA  Time Calculation: 28 mins

## 2012-07-07 NOTE — Other (Signed)
Bedside and Verbal shift change report given to Sara King,RN (oncoming nurse) by R.Sosa,RN (offgoing nurse).  Report given with SBAR, Kardex, Intake/Output and Recent Results.

## 2012-07-07 NOTE — Progress Notes (Signed)
PROGRESS NOTE   PATIENT:  Jeremy Walters           MRN: [id number]           [facility], [birth year]           07/07/2012, 2:37 PM      I  Mr. Jeremy Walters is a 51 y.o. male who is being seen for elevated LFT's, anemia, hem positive stool . Pt brought in by EMS with alcohol intoxication and having seizures. Noted to have gram negative sepsis.     SUBJECTIVE:  He is alert and awake, confused. Denies abdominal pain, nausea, vomiting. Greenish stool in the FMS.     OBJECTIVE:  Patient Vitals for the past 24 hrs:   Temp Pulse Resp BP SpO2   07/07/12 1143 97.3 ??F (36.3 ??C) 83  18  108/75 mmHg 100 %   07/07/12 0740 98.4 ??F (36.9 ??C) 97  18  128/92 mmHg 100 %   07/07/12 0528 98.6 ??F (37 ??C) 96  18  128/94 mmHg 100 %   07/07/12 0003 98.6 ??F (37 ??C) 96  18  108/77 mmHg 100 %   07/06/12 1930 99 ??F (37.2 ??C) 94  18  116/83 mmHg 100 %   07/06/12 1744 99 ??F (37.2 ??C) 108  18  124/83 mmHg 100 %   07/06/12 1543 98.7 ??F (37.1 ??C) 87  18  117/85 mmHg 100 %         Intake/Output Summary (Last 24 hours) at 07/07/12 1437  Last data filed at 07/07/12 1243   Gross per 24 hour   Intake   3345 ml   Output   3150 ml   Net    195 ml       Gen: NAD  Heent: No pallor, icterus  Abd  : Soft, non tender, BS +, No masses felt.   Extremities  : No pedal edema B/L   Neuro: Alert,  Awake, no asterexis.     Labs: Results:   Chemistry Recent Labs   Basename 07/07/12 0630 07/06/12 0510 07/05/12 0450    GLU 104* 110* 101*    NA 136 139 139    K 3.2* 3.7 2.6*    CL 102 105 101    CO2 25 26 28     BUN 2* 3* 9    CREA 0.62 0.65 0.78    CA 7.2* 7.1* 7.1*    AGAP 9 8 10     BUCR 3* 5* 12    TBIL -- 0.9 1.0    GPT -- 32 43    AP -- 228* 266*    TP -- 4.3* 4.6*    ALB -- 1.8* 2.0*    GLOB -- 2.5 2.6    AGRAT -- 0.7* 0.8    Estimated Creatinine Clearance: 114.3 ml/min (based on Cr of 0.62).   CBC w/Diff Recent Labs   Basename 07/07/12 0630 07/06/12 0510 07/05/12 1930 07/05/12 0450    WBC 3.9* 3.6* -- 3.9*    RBC 2.86* 3.03* -- 3.11*    HGB 8.8* 9.3* 8.9* --     HCT 25.1* 26.7* 26.3* --    PLT 72* 51* -- 38*    GRANS 57 65 -- 66    LYMPH 25 20* -- 18*    EOS 1 1 -- 0      Cardiac Enzymes Recent Labs   [facility] 07/04/12 2030 07/04/12 1535    CPK 366* 392*    CKRMB 1.6 2.2  CKND1 0.4 0.6    TROIP -- --    MYO -- --      Coagulation Recent Labs   Basename 07/06/12 0510    PTP 12.1    INR 1.0    APTT 30.2       Hepatitis Panel Lab Results   Component Value Date/Time    Hepatitis A, IgM NEGATIVE  07/05/2012 12:00 PM    Hepatitis B surface Ag <0.10 07/05/2012 12:00 PM    Hepatitis B core, IgM NEGATIVE  07/05/2012 12:00 PM      Amylase Lipase    Liver Enzymes Recent Labs   Basename 07/06/12 0510    TP 4.3*    ALB 1.8*    TBIL 0.9    AP 228*    SGOT 64*    GPT 32      Thyroid Studies No results found for this basename: T4,T3U,T3RU,TSH in the last 72 hours     Pathology pathology         Allergies   Allergen Reactions   ??? Morphine Rash and Nausea and Vomiting   ??? Shellfish Derived Rash and Nausea and Vomiting       Current Facility-Administered Medications   Medication Dose Route Frequency   ??? potassium chloride 10 mEq in 100 ml IVPB  10 mEq IntraVENous Q1H   ??? potassium chloride (K-DUR, KLOR-CON) SR tablet 40 mEq  40 mEq Oral NOW   ??? magnesium oxide (MAG-OX) tablet 400 mg  400 mg Oral BID   ??? 0.9% sodium chloride infusion  75 mL/hr IntraVENous CONTINUOUS   ??? ceFAZolin (ANCEF) 1 g in 0.9% sodium chloride (MBP/ADV) 50 mL MBP  1 g IntraVENous Q8H   ??? acetaminophen (TYLENOL) tablet 650 mg  650 mg Oral Q4H PRN   ??? magnesium oxide (MAG-OX) tablet 400 mg  400 mg Oral BID   ??? pantoprazole (PROTONIX) injection 40 mg  40 mg IntraVENous Q12H   ??? phenytoin ER (DILANTIN ER) ER capsule 200 mg  200 mg Oral ONCE   ??? thiamine (B-1) tablet 100 mg  100 mg Oral DAILY   ??? folic acid (FOLVITE) tablet 1 mg  1 mg Oral DAILY   ??? metoprolol (LOPRESSOR) tablet 25 mg  25 mg Oral BID   ??? ondansetron (ZOFRAN) injection 4 mg  4 mg IntraVENous Q4H PRN   ??? acetaminophen (TYLENOL) suppository 650 mg  650 mg Rectal  Q6H PRN   ??? sodium chloride (NS) flush 5-10 mL  5-10 mL IntraVENous Q8H   ??? sodium chloride (NS) flush 5-10 mL  5-10 mL IntraVENous PRN   ??? haloperidol lactate (HALDOL) injection 1 mg  1 mg IntraVENous Q2H PRN   ??? LORazepam (ATIVAN) tablet 1-2 mg  1-2 mg Oral Q1H PRN   ??? LORazepam (ATIVAN) tablet 3-4 mg  3-4 mg Oral Q1H PRN   ??? chlordiazePOXIDE (LIBRIUM) capsule 10 mg  10 mg Oral QID         Assessment:    1. Alcohol intoxication-  Brought in by EMS.   2.Seizure disorder - likely related to alcohol withdrawal   3. Anemia/pancytopenia- likely related to alcohol   4. Elevated LFTs- suspect alcoholic liver disease , viral hepatitis profile is negative.   5. Hem positive stool - No active bleeding.   6. Hx of pancreatitis, s/p pseudo cyst drainage- stable   7.Hypertension   8. Gram Neg sepsis- E.Coli. ? Source.   9. Chronic subdural hematoma   10. GERD.   Plan:  1. RUQ u/s   2. Check alpha feto protein   3. PPI for Gerd    4. Monitor Hgb/HCT  5. EGD and colonoscopy when stable.       Elberta Spaniel, MD

## 2012-07-07 NOTE — Progress Notes (Signed)
Problem: Dysphagia (Adult)  Goal: *Acute Goals and Plan of Care (Insert Text)    Dysphagia tx completed. Pt A&Ox4, motivated for solid intake, improved self-feeding noted; however, min-mod A required for food prep. Pt presents with mild oropharyngeal dysphagia c/b labored oral prep phase and decreased hyolaryngeal excursion during puree, mech-soft and thin liquid trials. Timely swallow reflex noted across study trials without evidence of aspiration/distress. Pt now agreeable to solids, tolerating with min labored bolus manip and subsequent lingual spread; however, 0 distress noted during pharyngeal phase. Rec advance diet to mechanical-soft, chopped meat, thin liquid diet; pt agreeable. SLP educated pt again with regard to compensatory swallow strategies and aspiration/reflux precautions, including small bites/sips, alternate liquids/solids, decrease feeding rate, HOB > 45 with all po, and upright in bed at 30 degrees after po for at least 45 minutes. SLP will cont to follow and advance diet, as tolerated.    REC:   Mech-soft, chopped meat, thin liquid diet  HOB >45 for po intake, remain >30 for 30-45 minutes after po   Small bites/sips; alternate liquid/solid with slow feeding rate   Verbal cues to swallow twice per bite/sip  Oral care TID  Meds whole, one @ a time    Patient will:  Utilize compensatory swallow maneuvers (decrease bolus size, decrease rate of intake, cyclical ingestion, etc.) to increase swallow function/safety with min visual, verbal and/or tactile cues in 4/5 trials.     Tolerate least-restrictive diet utilizing compensatory swallow techniques (decrease bolus size, decrease rate of intake, cyclical ingestion, etc.) without overt s/sx aspiration/distress in 4/5 trials with min visual, verbal, and/or tactile cues    Perform restorative oropharyngeal exercises and swallow maneuvers (supraglottic swallow, Mendelson maneuver, effortful swallow, OMEs etc.) to increase oropharyngeal  strength/awareness/agility, tongue base retraction, hyolaryngeal excursion, airway protection, and/or bolus clearance with min visual, verbal and/or tactile cues in 4/5 trials.    Demonstrate the ability to adequately self-monitor swallowing skills and perform   appropriate compensatory techniques to reduce s/sx of aspiration and to safely consume least-restrictive diet with min verbal, visual and/or tactile cues in 4/5 trials.  SPEECH LANGUAGE PATHOLOGY DYSPHAGIA TREATMENT    Patient: KEYONTAE HUCKEBY (51 y.o. male)  Date: 07/07/2012  Diagnosis: delirium tremens, seizure d/o, subdural hematoma, nausea/vomiting  Alcohol withdrawal seizure  Alcohol withdrawal seizure Alcohol withdrawal seizure       Precautions: aspiration; Fall;Seizure;Contact      ASSESSMENT:   As above  Progression toward goals:  [ ]          Improving appropriately and progressing toward goals  [X]          Improving slowly and progressing toward goals  [ ]          Not making progress toward goals and plan of care will be adjusted       PLAN:   Recommendations and Planned Interventions:  As above  Patient continues to benefit from skilled intervention to address the above impairments. Continue treatment per established plan of care.  Discharge Recommendations:  Skilled Nursing Facility and To Be Determined       SUBJECTIVE:   Patient stated ???Thank you???.      OBJECTIVE:   Cognitive and Communication Status:  Neurologic State: Alert  Orientation Level: Oriented to person;Oriented to place;Oriented to time  Cognition: Appropriate for age attention/concentration;Follows commands  Perception: Tactile;Verbal;Visual;Cues to maintain midline in sitting;Cues to maintain midline in standing  Perseveration: No perseveration noted  Safety/Judgement: Decreased awareness of environment;Decreased awareness of need for assistance;Decreased awareness  of need for safety;Home safety;Fall prevention  Dysphagia Treatment:  Oral Assessment:  Oral Assessment  Labial:  Decreased seal  Dentition: Natural  Oral Hygiene: fair  Lingual: Decreased rate  Velum: No impairment  Mandible: No impairment  P.O. Trials:  Patient Position: 40 hob  Vocal quality prior to P.O.: No impairment  Consistency Presented: Thin liquid;Puree;Mechanical soft  How Presented: SLP-fed/presented     Bolus Acceptance: No impairment  Bolus Formation/Control: Impaired  Type of Impairment: Delayed;Mastication  Propulsion: No impairment  Oral Residue: None  Initiation of Swallow: No impairment  Laryngeal Elevation: Functional  Aspiration Signs/Symptoms: None  Pharyngeal Phase Characteristics: No impairment, issues, or problems   Effective Modifications: Alternate liquids/solids;Small sips and bites  Cues for Modifications: Moderate       Oral Phase Severity: Mild  Pharyngeal Phase Severity : Mild     After treatment:   [ ]               Patient left in no apparent distress sitting up in chair  [X]               Patient left in no apparent distress in bed  [ ]               Call bell left within reach  [ ]               Nursing notified  [ ]               Caregiver present  [ ]               Bed alarm activated      COMMUNICATION/EDUCATION:   [X]               Posted safety precautions in patient's room.    Hilbert Bible, M.S., CCC-SLP  Office: [phone]  Pager: [phone]

## 2012-07-07 NOTE — Progress Notes (Addendum)
Walking rounds done at 1940,pt sleeping then. Currently alert & oriented WM denies c/o pain/nausea/SOB; assessment completed.  2330 Pt vomited 600 ml brown digested opaque emesis.  2337 Zofran 4 mg IV for continued nausea.  2345 Pt requested ginger ale, no more nausea. Encouraged pt to not gulp liquids, reminded pt of NPO after midnight for abdominal US in AM. Sitter aware.  07/08/2012 0004 Pt now NPO. Located SCD stockings, placed on pt.  07/08/2012 6:26 AM checked hourly & prn. No s/s seizures/ DTs

## 2012-07-07 NOTE — Other (Signed)
Verbal shift change report given to Nancy Dickie, RN (oncoming nurse) by SARA N KING, RN   (offgoing nurse).  Report given with SBAR, Kardex, ED Summary, OR Summary, Procedure Summary, Intake/Output, MAR and Recent Results.

## 2012-07-07 NOTE — Progress Notes (Signed)
Infectious Disease Follow-up    Admit Date: 07/03/2012  v   Current Antibiotics:  Prior    Ancef-1  abx -3 Vancomycin, Zosyn and metronidazole    Microbiology: e. COLI pan-sensitive   Assessment:      BSI-E. Coli 3 of 3   With positive GIB suspect GI source   2 d neg.  Ancef   H/o murmur   > 2 D echo neg  GI workup per GI    Sepsis  Temp down wbc down  Continue rx    GIB  Per GI   RUQ [place]   SZDZ   Thought to be ETOH withdrawal     Elevated NH3     plts down   ETOH liver disease     SDH   Head CT 07/03/12,"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 No evidence of acute hemorrhage or additional acute intracranial abnormality     Chronic/acute alcoholism               Active Hospital Problems    Diagnosis Date Noted   ??? Sepsis 07/05/2012   ??? 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 Thrombocytopenia, unspecified 07/04/2012     Overview Note:     Due to alcoholic liver disease   Monitor      ??? SVT (supraventricular tachycardia) 07/04/2012     Overview Note:     Possibly due to Abnormal electrolyte,   cardiac enzymes negative echo BB     ??? 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 No evidence of acute hemorrhage or additional acute intracranial abnormality."       ??? DTs (delirium tremens) 07/04/2012     Overview Note:     Alcohol withdrawal protocol   UDS 07/04/12 negative      ??? Melena 07/04/2012     Overview Note:     Guaiac stool positive 07/04/12   Stool studies      ??? Anemia, unspecified 07/04/2012     Overview Note:     Anemia profile      ??? SIRS (systemic inflammatory response syndrome) 07/04/2012     Overview Note:     Lactate 4.6  BCx 07/04/12 x 2  Fever tachycardic tachypnea   Vancomycin/zosyn     ??? Electrolyte abnormality 07/04/2012     Overview Note:     Hypokalemia hypophosphatemia      ??? Seizure      Overview Note:     keppra,  dilantin, neurology            Subjective:     Interval notes reviewed. Pt feeling better. Temp down, wbc down, GI work up in progress. Continue abx, 2 D neg.     Current Facility-Administered Medications   Medication Dose Route Frequency   ??? potassium chloride 10 mEq in 100 ml IVPB  10 mEq IntraVENous Q1H   ??? potassium chloride (K-DUR, KLOR-CON) SR tablet 40 mEq  40 mEq Oral NOW   ??? magnesium oxide (MAG-OX) tablet 400 mg  400 mg Oral BID   ??? 0.9% sodium chloride infusion  75 mL/hr IntraVENous CONTINUOUS   ??? ceFAZolin (ANCEF) 1 g in 0.9% sodium chloride (MBP/ADV) 50 mL MBP  1 g IntraVENous Q8H   ??? acetaminophen (TYLENOL) tablet 650 mg  650 mg Oral Q4H PRN   ???  magnesium oxide (MAG-OX) tablet 400 mg  400 mg Oral BID   ??? pantoprazole (PROTONIX) injection 40 mg  40 mg IntraVENous Q12H   ??? phenytoin ER (DILANTIN ER) ER capsule 200 mg  200 mg Oral ONCE   ??? thiamine (B-1) tablet 100 mg  100 mg Oral DAILY   ??? folic acid (FOLVITE) tablet 1 mg  1 mg Oral DAILY   ??? metoprolol (LOPRESSOR) tablet 25 mg  25 mg Oral BID   ??? ondansetron (ZOFRAN) injection 4 mg  4 mg IntraVENous Q4H PRN   ??? acetaminophen (TYLENOL) suppository 650 mg  650 mg Rectal Q6H PRN   ??? sodium chloride (NS) flush 5-10 mL  5-10 mL IntraVENous Q8H   ??? sodium chloride (NS) flush 5-10 mL  5-10 mL IntraVENous PRN   ??? haloperidol lactate (HALDOL) injection 1 mg  1 mg IntraVENous Q2H PRN   ??? LORazepam (ATIVAN) tablet 1-2 mg  1-2 mg Oral Q1H PRN   ??? LORazepam (ATIVAN) tablet 3-4 mg  3-4 mg Oral Q1H PRN   ??? chlordiazePOXIDE (LIBRIUM) capsule 10 mg  10 mg Oral QID         Objective:     BP 113/69   Pulse 85   Temp 97.5 ??F (36.4 ??C)   Resp 18   Ht 5\' 6"  (1.676 m)   Wt 56.7 kg (125 lb)   BMI 20.18 kg/m2   SpO2 96%    Temp (24hrs), Avg:98.3 ??F (36.8 ??C), Min:97.3 ??F (36.3 ??C), Max:99 ??F (37.2 ??C)        Labs: Results:   Chemistry Recent Labs   [facility] 07/07/12 0630 07/06/12 0510 07/05/12 0450    GLU 104* 110* 101*    NA 136 139 139    K 3.2* 3.7 2.6*    CL 102 105 101     CO2 25 26 28     BUN 2* 3* 9    CREA 0.62 0.65 0.78    CA 7.2* 7.1* 7.1*    AGAP 9 8 10     BUCR 3* 5* 12    TBIL -- 0.9 1.0    GPT -- 32 43    AP -- 228* 266*    TP -- 4.3* 4.6*    ALB -- 1.8* 2.0*    GLOB -- 2.5 2.6    AGRAT -- 0.7* 0.8      CBC w/Diff Recent Labs   Basename 07/07/12 0630 07/06/12 0510 07/05/12 1930 07/05/12 0450    WBC 3.9* 3.6* -- 3.9*    RBC 2.86* 3.03* -- 3.11*    HGB 8.8* 9.3* 8.9* --    HCT 25.1* 26.7* 26.3* --    PLT 72* 51* -- 38*    GRANS 57 65 -- 66    LYMPH 25 20* -- 18*    EOS 1 1 -- 0        Microbiology Results  No results found for this basename: SDES:3,CULT:3 in the last 72 hours    Tawnya Crook, MD  July 07, 2012  [facility]  [phone]

## 2012-07-08 LAB — METABOLIC PANEL, BASIC
Anion gap: 10 mmol/L (ref 3.0–18)
BUN/Creatinine ratio: 4 — ABNORMAL LOW (ref 12–20)
BUN: 2 MG/DL — ABNORMAL LOW (ref 7.0–18)
CO2: 23 MMOL/L (ref 21–32)
Calcium: 7.3 MG/DL — ABNORMAL LOW (ref 8.5–10.1)
Chloride: 104 MMOL/L (ref 100–108)
Creatinine: 0.52 MG/DL — ABNORMAL LOW (ref 0.6–1.3)
GFR est AA: >60 mL/min/{1.73_m2} (ref >60)
GFR est non-AA: >60 mL/min/{1.73_m2} (ref >60)
Glucose: 100 MG/DL — ABNORMAL HIGH (ref 74–99)
Potassium: 4.1 MMOL/L (ref 3.5–5.5)
Sodium: 137 MMOL/L (ref 136–145)

## 2012-07-08 LAB — HEPATIC FUNCTION PANEL
A-G Ratio: 0.7 — ABNORMAL LOW (ref 0.8–1.7)
ALT (SGPT): 25 U/L (ref 12.0–78.0)
AST (SGOT): 56 U/L — ABNORMAL HIGH (ref 15–37)
Albumin: 2 g/dL — ABNORMAL LOW (ref 3.4–5.0)
Alk. phosphatase: 505 U/L — ABNORMAL HIGH (ref 50–136)
Bilirubin, direct: 0.4 MG/DL — ABNORMAL HIGH (ref 0.0–0.2)
Bilirubin, total: 0.7 MG/DL (ref 0.2–1.0)
Globulin: 2.7 g/dL (ref 2.0–4.0)
Protein, total: 4.7 g/dL — ABNORMAL LOW (ref 6.4–8.2)

## 2012-07-08 LAB — CBC WITH AUTOMATED DIFF
ABS. BASOPHILS: 0.1 10*3/uL — ABNORMAL HIGH (ref 0.0–0.06)
ABS. EOSINOPHILS: 0.1 10*3/uL (ref 0.0–0.4)
ABS. LYMPHOCYTES: 1.4 10*3/uL (ref 0.9–3.6)
ABS. MONOCYTES: 1 10*3/uL (ref 0.05–1.2)
ABS. NEUTROPHILS: 2.6 10*3/uL (ref 1.8–8.0)
BASOPHILS: 2 % (ref 0–2)
EOSINOPHILS: 1 % (ref 0–5)
HCT: 25.9 % — ABNORMAL LOW (ref 36.0–48.0)
HGB: 8.9 g/dL — ABNORMAL LOW (ref 13.0–16.0)
LYMPHOCYTES: 27 % (ref 21–52)
MCH: 30.7 PG (ref 24.0–34.0)
MCHC: 34.4 g/dL (ref 31.0–37.0)
MCV: 89.3 FL (ref 74.0–97.0)
MONOCYTES: 19 % — ABNORMAL HIGH (ref 3–10)
MPV: 10.1 FL (ref 9.2–11.8)
NEUTROPHILS: 51 % (ref 40–73)
PLATELET: 129 10*3/uL — ABNORMAL LOW (ref 135–420)
RBC: 2.9 M/uL — ABNORMAL LOW (ref 4.70–5.50)
RDW: 14.3 % (ref 11.6–14.5)
WBC: 5.2 10*3/uL (ref 4.6–13.2)

## 2012-07-08 LAB — PHENYTOIN, TOTAL & FREE
Phenytoin, free: 1.4 ug/mL (ref 1.0–2.0)
Phenytoin, total: 7.5 ug/mL — ABNORMAL LOW (ref 10.0–20.0)

## 2012-07-08 LAB — MAGNESIUM: Magnesium: 1.2 MG/DL — ABNORMAL LOW (ref 1.8–2.4)

## 2012-07-08 MED ADMIN — sodium chloride (NS) flush 5-10 mL: INTRAVENOUS | @ 10:00:00 | NDC 87701099893

## 2012-07-08 MED ADMIN — ondansetron (ZOFRAN) injection 4 mg: INTRAVENOUS | @ 04:00:00 | NDC 00409475503

## 2012-07-08 MED ADMIN — chlordiazePOXIDE (LIBRIUM) capsule 10 mg: ORAL | @ 22:00:00 | NDC 51079037401

## 2012-07-08 MED ADMIN — pantoprazole (PROTONIX) injection 40 mg: INTRAVENOUS | @ 13:00:00 | NDC 00008400101

## 2012-07-08 MED ADMIN — chlordiazePOXIDE (LIBRIUM) capsule 10 mg: ORAL | @ 02:00:00 | NDC 51079037401

## 2012-07-08 MED ADMIN — 0.9% sodium chloride infusion: INTRAVENOUS | @ 05:00:00 | NDC 00409798309

## 2012-07-08 MED ADMIN — ceFAZolin (ANCEF) 1 g in 0.9% sodium chloride (MBP/ADV) 50 mL MBP: INTRAVENOUS | @ 22:00:00 | NDC 00781345170

## 2012-07-08 MED ADMIN — sodium chloride (NS) flush 5-10 mL: INTRAVENOUS | @ 18:00:00 | NDC 87701099893

## 2012-07-08 MED ADMIN — magnesium oxide (MAG-OX) tablet 400 mg: ORAL | @ 13:00:00 | NDC 96295013573

## 2012-07-08 MED ADMIN — chlordiazePOXIDE (LIBRIUM) capsule 10 mg: ORAL | @ 13:00:00 | NDC 51079037401

## 2012-07-08 MED ADMIN — magnesium oxide (MAG-OX) tablet 400 mg: ORAL | @ 20:00:00 | NDC 96295013573

## 2012-07-08 MED ADMIN — chlordiazePOXIDE (LIBRIUM) capsule 10 mg: ORAL | @ 20:00:00 | NDC 51079037401

## 2012-07-08 MED ADMIN — ceFAZolin (ANCEF) 1 g in 0.9% sodium chloride (MBP/ADV) 50 mL MBP: INTRAVENOUS | @ 05:00:00 | NDC 00781345170

## 2012-07-08 MED ADMIN — magnesium sulfate 2 g/50 ml IVPB (premix or compounded): INTRAVENOUS | @ 16:00:00 | NDC 00409672924

## 2012-07-08 MED ADMIN — thiamine (B-1) tablet 100 mg: ORAL | @ 13:00:00 | NDC 90011015050

## 2012-07-08 MED ADMIN — magnesium oxide (MAG-OX) tablet 400 mg: ORAL | @ 22:00:00 | NDC 96295013573

## 2012-07-08 MED ADMIN — sodium chloride (NS) flush 5-10 mL: INTRAVENOUS | @ 02:00:00 | NDC 87701099893

## 2012-07-08 MED ADMIN — 0.9% sodium chloride infusion: INTRAVENOUS | @ 22:00:00 | NDC 00409798309

## 2012-07-08 MED ADMIN — phenytoin (DILANTIN) oral suspension 200 mg: ORAL | @ 15:00:00 | NDC 68094053358

## 2012-07-08 MED ADMIN — peg 3350-Electrolytes-Vit C (MOVIPREP) oral solution 1 L: ORAL | @ 22:00:00 | NDC 65649020175

## 2012-07-08 MED ADMIN — magnesium sulfate 2 g/50 ml IVPB (premix or compounded): INTRAVENOUS | @ 15:00:00 | NDC 00409672924

## 2012-07-08 MED ADMIN — metoprolol (LOPRESSOR) tablet 25 mg: ORAL | @ 22:00:00 | NDC 62584026511

## 2012-07-08 MED ADMIN — metoprolol (LOPRESSOR) tablet 25 mg: ORAL | @ 13:00:00 | NDC 62584026511

## 2012-07-08 MED ADMIN — sodium chloride (NS) flush 5-10 mL: INTRAVENOUS | @ 04:00:00 | NDC 87701099893

## 2012-07-08 MED ADMIN — ceFAZolin (ANCEF) 1 g in 0.9% sodium chloride (MBP/ADV) 50 mL MBP: INTRAVENOUS | @ 13:00:00 | NDC 00781345170

## 2012-07-08 MED ADMIN — folic acid (FOLVITE) tablet 1 mg: ORAL | @ 13:00:00 | NDC 62584089711

## 2012-07-08 MED ADMIN — pantoprazole (PROTONIX) injection 40 mg: INTRAVENOUS | @ 02:00:00 | NDC 00008400101

## 2012-07-08 MED ADMIN — promethazine (PHENERGAN) tablet 25 mg: ORAL | @ 22:00:00 | NDC 68084015511

## 2012-07-08 MED FILL — MOVIPREP 100 GRAM-7.5 GRAM-2.691 GRAM ORAL POWDER PACKET: ORAL | Qty: 1

## 2012-07-08 MED FILL — MAGNESIUM OXIDE 400 MG TAB: 400 mg | ORAL | Qty: 1

## 2012-07-08 MED FILL — PROTONIX 40 MG INTRAVENOUS SOLUTION: 40 mg | INTRAVENOUS | Qty: 40

## 2012-07-08 MED FILL — METOPROLOL TARTRATE 25 MG TAB: 25 mg | ORAL | Qty: 1

## 2012-07-08 MED FILL — PROMETHAZINE 25 MG TAB: 25 mg | ORAL | Qty: 1

## 2012-07-08 MED FILL — PHENYTOIN 100 MG/4 ML ORAL SUSP: 100 mg/4 mL | ORAL | Qty: 8

## 2012-07-08 MED FILL — CEFAZOLIN 1 GRAM SOLUTION FOR INJECTION: 1 gram | INTRAMUSCULAR | Qty: 1000

## 2012-07-08 MED FILL — CHLORDIAZEPOXIDE 5 MG CAP: 5 mg | ORAL | Qty: 2

## 2012-07-08 MED FILL — ONDANSETRON (PF) 4 MG/2 ML INJECTION: 4 mg/2 mL | INTRAMUSCULAR | Qty: 2

## 2012-07-08 MED FILL — CITROMA ORAL SOLUTION: ORAL | Qty: 296

## 2012-07-08 MED FILL — VITAMIN B-1 100 MG TABLET: 100 mg | ORAL | Qty: 1

## 2012-07-08 MED FILL — FOLIC ACID 1 MG TAB: 1 mg | ORAL | Qty: 1

## 2012-07-08 NOTE — Progress Notes (Signed)
Problem: Dysphagia (Adult)  Goal: *Acute Goals and Plan of Care (Insert Text)    Pt currently NPO for procedure, 0 po trials completed accordingly. Pt A&Ox4, reports hunger, SLP educated pt re: NPO status. SLP educated pt with regard to compensatory swallow strategies and aspiration/reflux precautions, including small bites/sips, alternate liquids/solids, decrease feeding rate, HOB > 45 with all po, and upright in bed at 30 degrees after po for at least 45 minutes. + response to education with immediate return. SLP will cont to follow and complete po trials after procedure.    REC:   Mech-soft, chopped meat, thin liquid diet  HOB >45 for po intake, remain >30 for 30-45 minutes after po   Small bites/sips; alternate liquid/solid with slow feeding rate   Verbal cues to swallow twice per bite/sip  Oral care TID  Meds whole, one @ a time    Patient will:  Utilize compensatory swallow maneuvers (decrease bolus size, decrease rate of intake, cyclical ingestion, etc.) to increase swallow function/safety with min visual, verbal and/or tactile cues in 4/5 trials.     Tolerate least-restrictive diet utilizing compensatory swallow techniques (decrease bolus size, decrease rate of intake, cyclical ingestion, etc.) without overt s/sx aspiration/distress in 4/5 trials with min visual, verbal, and/or tactile cues    Perform restorative oropharyngeal exercises and swallow maneuvers (supraglottic swallow, Mendelson maneuver, effortful swallow, OMEs etc.) to increase oropharyngeal strength/awareness/agility, tongue base retraction, hyolaryngeal excursion, airway protection, and/or bolus clearance with min visual, verbal and/or tactile cues in 4/5 trials.    Demonstrate the ability to adequately self-monitor swallowing skills and perform   appropriate compensatory techniques to reduce s/sx of aspiration and to safely consume least-restrictive diet with min verbal, visual and/or tactile cues in 4/5 trials.  SPEECH LANGUAGE PATHOLOGY  DYSPHAGIA TREATMENT    Patient: Jeremy Walters (51 y.o. male)  Date: 07/08/2012  Diagnosis: delirium tremens, seizure d/o, subdural hematoma, nausea/vomiting  Alcohol withdrawal seizure  Alcohol withdrawal seizure Alcohol withdrawal seizure       Precautions:  Fall;Seizure;Contact      ASSESSMENT:   As above  Progression toward goals:  [ ]          Improving appropriately and progressing toward goals  [X]          Improving slowly and progressing toward goals  [ ]          Not making progress toward goals and plan of care will be adjusted       PLAN:   Recommendations and Planned Interventions:  As above  Patient continues to benefit from skilled intervention to address the above impairments. Continue treatment per established plan of care.  Discharge Recommendations:  Skilled Nursing Facility and To Be Determined       SUBJECTIVE:   Patient stated ???Thank you???.      OBJECTIVE:   Cognitive and Communication Status:  Neurologic State: Alert  Orientation Level: Oriented X4  Cognition: Appropriate for age attention/concentration;Follows commands  Perception: Tactile;Verbal;Visual;Cues to maintain midline in sitting;Cues to maintain midline in standing  Perseveration: No perseveration noted  Safety/Judgement: Decreased awareness of environment;Decreased awareness of need for assistance;Decreased awareness of need for safety;Home safety;Fall prevention  Dysphagia Treatment:  Oral Assessment:  Oral Assessment  Labial: Decreased seal  Dentition: Natural  Oral Hygiene: fair  Lingual: Decreased rate  Velum: No impairment  Mandible: No impairment  P.O. Trials:  Patient Position: 40 hob  Vocal quality prior to P.O.: No impairment  Consistency Presented: Thin liquid;Puree;Mechanical soft  How Presented: SLP-fed/presented  Bolus Acceptance: No impairment  Bolus Formation/Control: Impaired  Type of Impairment: Delayed;Mastication  Propulsion: No impairment  Oral Residue: None  Initiation of Swallow: No impairment  Laryngeal  Elevation: Functional  Aspiration Signs/Symptoms: None  Pharyngeal Phase Characteristics: No impairment, issues, or problems   Effective Modifications: Alternate liquids/solids;Small sips and bites  Cues for Modifications: Moderate       Oral Phase Severity: Mild  Pharyngeal Phase Severity : Mild     After treatment:   [ ]               Patient left in no apparent distress sitting up in chair  [X]               Patient left in no apparent distress in bed  [ ]               Call bell left within reach  [ ]               Nursing notified  [ ]               Caregiver present  [ ]               Bed alarm activated      COMMUNICATION/EDUCATION:   [X]               Posted safety precautions in patient's room.    Hilbert Bible, M.S., CCC-SLP  Office: [phone]  Pager: [phone]

## 2012-07-08 NOTE — Progress Notes (Signed)
Neurology Progress Note    Patient ID:  Jeremy Walters  [id number]  51 y.o.  1961-05-03    Subjective:      Patient has no recurrent seizures on current dilantin dose. Less dizziness when walking now. His Dilantin free level is still pending. He is under contact isolation for possible C diff.     Current Facility-Administered Medications   Medication Dose Route Frequency   ??? magnesium oxide (MAG-OX) tablet 400 mg  400 mg Oral BID   ??? magnesium sulfate 2 g/50 ml IVPB (premix or compounded)  2 g IntraVENous Q1H   ??? phenytoin (DILANTIN) oral suspension 200 mg  200 mg Oral DAILY   ??? peg 3350-Electrolytes-Vit C (MOVIPREP) oral solution 1 L  1 L Oral Q12H   ??? magnesium citrate solution 296 mL  296 mL Oral NOW   ??? promethazine (PHENERGAN) tablet 25 mg  25 mg Oral Q12H   ??? potassium chloride 10 mEq in 100 ml IVPB  10 mEq IntraVENous Q1H   ??? magnesium oxide (MAG-OX) tablet 400 mg  400 mg Oral BID   ??? 0.9% sodium chloride infusion  75 mL/hr IntraVENous CONTINUOUS   ??? ceFAZolin (ANCEF) 1 g in 0.9% sodium chloride (MBP/ADV) 50 mL MBP  1 g IntraVENous Q8H   ??? acetaminophen (TYLENOL) tablet 650 mg  650 mg Oral Q4H PRN   ??? pantoprazole (PROTONIX) injection 40 mg  40 mg IntraVENous Q12H   ??? thiamine (B-1) tablet 100 mg  100 mg Oral DAILY   ??? folic acid (FOLVITE) tablet 1 mg  1 mg Oral DAILY   ??? metoprolol (LOPRESSOR) tablet 25 mg  25 mg Oral BID   ??? ondansetron (ZOFRAN) injection 4 mg  4 mg IntraVENous Q4H PRN   ??? acetaminophen (TYLENOL) suppository 650 mg  650 mg Rectal Q6H PRN   ??? sodium chloride (NS) flush 5-10 mL  5-10 mL IntraVENous Q8H   ??? sodium chloride (NS) flush 5-10 mL  5-10 mL IntraVENous PRN   ??? haloperidol lactate (HALDOL) injection 1 mg  1 mg IntraVENous Q2H PRN   ??? LORazepam (ATIVAN) tablet 1-2 mg  1-2 mg Oral Q1H PRN   ??? LORazepam (ATIVAN) tablet 3-4 mg  3-4 mg Oral Q1H PRN   ??? chlordiazePOXIDE (LIBRIUM) capsule 10 mg  10 mg Oral QID            Objective:     Patient Vitals for the past 8 hrs:   BP Temp Pulse  Resp SpO2   07/08/12 1541 129/91 mmHg 97.6 ??F (36.4 ??C) 70  18  98 %           Lab Review   Recent Results (from the past 24 hour(s))   METABOLIC PANEL, BASIC    Collection Time    07/08/12  4:30 AM       Component Value Range    Sodium 137  136 - 145 MMOL/L    Potassium 4.1  3.5 - 5.5 MMOL/L    Chloride 104  100 - 108 MMOL/L    CO2 23  21 - 32 MMOL/L    Anion gap 10  3.0 - 18 mmol/L    Glucose 100 (*) 74 - 99 MG/DL    BUN 2 (*) 7.0 - 18 MG/DL    Creatinine 4.09 (*) 0.6 - 1.3 MG/DL    BUN/Creatinine ratio 4 (*) 12 - 20      GFR est-AA >60  >60 ml/min/1.25m2    GFR est  non-AA >60  >60 ml/min/1.67m2    Calcium 7.3 (*) 8.5 - 10.1 MG/DL   CBC WITH AUTOMATED DIFF    Collection Time    07/08/12  4:30 AM       Component Value Range    WBC 5.2  4.6 - 13.2 K/uL    RBC 2.90 (*) 4.70 - 5.50 M/uL    HGB 8.9 (*) 13.0 - 16.0 g/dL    HCT 82.9 (*) 56.2 - 48.0 %    MCV 89.3  74.0 - 97.0 FL    MCH 30.7  24.0 - 34.0 PG    MCHC 34.4  31.0 - 37.0 g/dL    RDW 13.0  86.5 - 78.4 %    PLATELET 129 (*) 135 - 420 K/uL    MPV 10.1  9.2 - 11.8 FL    NEUTROPHILS 51  40 - 73 %    LYMPHOCYTES 27  21 - 52 %    MONOCYTES 19 (*) 3 - 10 %    EOSINOPHILS 1  0 - 5 %    BASOPHILS 2  0 - 2 %    ABS. NEUTROPHILS 2.6  1.8 - 8.0 K/UL    ABS. LYMPHOCYTES 1.4  0.9 - 3.6 K/UL    ABS. MONOCYTES 1.0  0.05 - 1.2 K/UL    ABS. EOSINOPHILS 0.1  0.0 - 0.4 K/UL    ABS. BASOPHILS 0.1 (*) 0.0 - 0.06 K/UL    RBC COMMENTS NORMOCYTIC, NORMOCHROMIC      DF SMEAR SCANNED      WBC COMMENTS 1+     MAGNESIUM    Collection Time    07/08/12  4:30 AM       Component Value Range    Magnesium 1.2 (*) 1.8 - 2.4 MG/DL   HEPATIC FUNCTION PANEL    Collection Time    07/08/12  4:30 AM       Component Value Range    Protein, total 4.7 (*) 6.4 - 8.2 g/dL    Albumin 2.0 (*) 3.4 - 5.0 g/dL    Globulin 2.7  2.0 - 4.0 g/dL    A-G Ratio 0.7 (*) 0.8 - 1.7      Bilirubin, total 0.7  0.2 - 1.0 MG/DL    Bilirubin, direct 0.4 (*) 0.0 - 0.2 MG/DL    Alk. phosphatase 505 (*) 50 - 136 U/L    AST 56 (*) 15 - 37  U/L    ALT 25  12.0 - 78.0 U/L       Additional comments: reviewed above.      NEUROLOGICAL EXAM:    He is awake and sitting bed, NAD, f/o commends and normal speech. PERRL and EOMI. Face symmetry and no focal weakness of his limbs.     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 No evidence of acute hemorrhage or    additional acute intracranial abnormality."        DTs (delirium tremens) (07/04/2012)  Alcohol withdrawal protocol    UDS 07/04/12 negative      Melena (07/04/2012)   Guaiac stool positive 07/04/12    Stool studies      Anemia, unspecified (07/04/2012)   Anemia profile      SIRS (systemic inflammatory response syndrome) (07/04/2012)   Lactate 4.6   BCx 07/04/12 x 2   Fever tachycardic tachypnea    Vancomycin/zosyn     Electrolyte abnormality (07/04/2012)   Hypokalemia hypophosphatemia      Sepsis (07/05/2012)     Abnormal liver enzymes (07/08/2012)      Plan:   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 Will f./u his free level.      Signed:  Judithe Modest, MD  07/08/2012  4:07 PM

## 2012-07-08 NOTE — Progress Notes (Signed)
Received am report from nurse Dickie, patient noted in bed sleeping with sitter at bedside, no signs of distress or discomfort will continue to monitor.

## 2012-07-08 NOTE — Progress Notes (Signed)
Infectious Disease Follow-up    Admit Date: 07/03/2012  v   Current Antibiotics:  Prior    Ancef-2  abx -4 of 14 Vancomycin, Zosyn and metronidazole    Microbiology: e. COLI pan-sensitive   Assessment:      BSI-E. Coli 3 of 3   With positive GIB suspect GI source   2 d neg.  Ancef   H/o murmur   > 2 D echo neg  GI workup per GI   Can be switch pt cipro when ready for discharge to finish a 14 days course   Sepsis  Temp down wbc down  Continue rx    GIB  Per GI   RUQ [place]   SZDZ   Thought to be ETOH withdrawal     Elevated NH3     plts down   ETOH liver disease     SDH   Head CT 07/03/12,"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 No evidence of acute hemorrhage or additional acute intracranial abnormality     Chronic/acute alcoholism               Active Hospital Problems    Diagnosis Date Noted   ??? Abnormal liver enzymes 07/08/2012   ??? Sepsis 07/05/2012   ??? 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 Thrombocytopenia, unspecified 07/04/2012     Overview Note:     Due to alcoholic liver disease   Monitor      ??? SVT (supraventricular tachycardia) 07/04/2012     Overview Note:     Possibly due to Abnormal electrolyte,   cardiac enzymes negative echo BB     ??? 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 No evidence of acute hemorrhage or additional acute intracranial abnormality."       ??? DTs (delirium tremens) 07/04/2012     Overview Note:     Alcohol withdrawal protocol   UDS 07/04/12 negative      ??? Melena 07/04/2012     Overview Note:     Guaiac stool positive 07/04/12   Stool studies      ??? Anemia, unspecified 07/04/2012     Overview Note:     Anemia profile      ??? SIRS (systemic inflammatory response syndrome) 07/04/2012     Overview Note:     Lactate 4.6  BCx 07/04/12 x 2  Fever tachycardic tachypnea   Vancomycin/zosyn     ??? Electrolyte  abnormality 07/04/2012     Overview Note:     Hypokalemia hypophosphatemia      ??? Seizure      Overview Note:     keppra, dilantin, neurology            Subjective:     Interval notes reviewed. Pt feeling better. Temp down, wbc down, GI work up in progress. Continue abx, 2 D neg. Can be switch to po cipro at discharge to finish a 14 days rx.     Current Facility-Administered Medications   Medication Dose Route Frequency   ??? magnesium oxide (MAG-OX) tablet 400 mg  400 mg Oral BID   ??? magnesium sulfate 2 g/50 ml IVPB (premix or compounded)  2 g IntraVENous Q1H   ??? phenytoin (DILANTIN) oral suspension 200 mg  200 mg Oral DAILY   ??? potassium chloride 10 mEq in 100 ml IVPB  10 mEq IntraVENous Q1H   ??? potassium chloride (K-DUR, KLOR-CON) SR tablet 40 mEq  40 mEq Oral NOW   ??? magnesium oxide (MAG-OX) tablet 400 mg  400 mg Oral BID   ??? 0.9% sodium chloride infusion  75 mL/hr IntraVENous CONTINUOUS   ??? ceFAZolin (ANCEF) 1 g in 0.9% sodium chloride (MBP/ADV) 50 mL MBP  1 g IntraVENous Q8H   ??? acetaminophen (TYLENOL) tablet 650 mg  650 mg Oral Q4H PRN   ??? pantoprazole (PROTONIX) injection 40 mg  40 mg IntraVENous Q12H   ??? thiamine (B-1) tablet 100 mg  100 mg Oral DAILY   ??? folic acid (FOLVITE) tablet 1 mg  1 mg Oral DAILY   ??? metoprolol (LOPRESSOR) tablet 25 mg  25 mg Oral BID   ??? ondansetron (ZOFRAN) injection 4 mg  4 mg IntraVENous Q4H PRN   ??? acetaminophen (TYLENOL) suppository 650 mg  650 mg Rectal Q6H PRN   ??? sodium chloride (NS) flush 5-10 mL  5-10 mL IntraVENous Q8H   ??? sodium chloride (NS) flush 5-10 mL  5-10 mL IntraVENous PRN   ??? haloperidol lactate (HALDOL) injection 1 mg  1 mg IntraVENous Q2H PRN   ??? LORazepam (ATIVAN) tablet 1-2 mg  1-2 mg Oral Q1H PRN   ??? LORazepam (ATIVAN) tablet 3-4 mg  3-4 mg Oral Q1H PRN   ??? chlordiazePOXIDE (LIBRIUM) capsule 10 mg  10 mg Oral QID         Objective:     BP 140/91   Pulse 85   Temp 98.3 ??F (36.8 ??C)   Resp 18   Ht 5\' 6"  (1.676 m)   Wt 56.7 kg (125 lb)   BMI 20.18 kg/m2   SpO2  100%    Temp (24hrs), Avg:97.9 ??F (36.6 ??C), Min:97.3 ??F (36.3 ??C), Max:98.4 ??F (36.9 ??C)        Labs: Results:   Chemistry Recent Labs   [facility] 07/08/12 0430 07/07/12 0630 07/06/12 0510    GLU 100* 104* 110*    NA 137 136 139    K 4.1 3.2* 3.7    CL 104 102 105    CO2 23 25 26     BUN 2* 2* 3*    CREA 0.52* 0.62 0.65    CA 7.3* 7.2* 7.1*    AGAP 10 9 8     BUCR 4* 3* 5*    TBIL 0.7 -- 0.9    GPT 25 -- 32    AP 505* -- 228*    TP 4.7* -- 4.3*    ALB 2.0* -- 1.8*    GLOB 2.7 -- 2.5    AGRAT 0.7* -- 0.7*      CBC w/Diff Recent Labs   [facility] 07/08/12 0430 07/07/12 0630 07/06/12 0510    WBC 5.2 3.9* 3.6*    RBC 2.90* 2.86* 3.03*    HGB 8.9* 8.8* 9.3*    HCT 25.9* 25.1* 26.7*    PLT 129* 72* 51*    GRANS PENDING 57 65    LYMPH PENDING 25 20*    EOS PENDING 1 1        Microbiology Results  No results found for this basename: SDES:3,CULT:3 in the last 72 hours    Tawnya Crook, MD  July 08, 2012  [facility]  [phone]

## 2012-07-08 NOTE — Progress Notes (Signed)
Problem: Mobility Impaired (Adult and Pediatric)  Goal: *Acute Goals and Plan of Care (Insert Text)  Physical Therapy Goals  Initiated 07/04/2012 and to be accomplished within 7 day(s)  1. Patient will move from supine to sit and sit to supine and roll side to side in bed with independence.   2. Patient will transfer from bed to chair and chair to bed with modified independence using the least restrictive device.  3. Patient will perform sit to stand with modified independence.  4. Patient will ambulate with modified independence for 100 feet with the least restrictive device.   5. Patient will ascend/descend 3 stairs with 1 handrail(s) with minimal assistance/contact guard assist.   PHYSICAL THERAPY TREATMENT    Patient: Jeremy Walters (51 y.o. male)  Date: 07/08/2012  Diagnosis: delirium tremens, seizure d/o, subdural hematoma, nausea/vomiting  Alcohol withdrawal seizure  Alcohol withdrawal seizure  anemia, hemme positive stools Alcohol withdrawal seizure  Procedure(s) (LRB):  ESOPHAGOGASTRODUODENOSCOPY (EGD) (N/A)  COLONOSCOPY (N/A)    Precautions: Contact;Fall;Seizure  Chart, physical therapy assessment, plan of care and goals were reviewed.      ASSESSMENT:   Patient continues to demonstrate increased trunk sway and ataxia.  Patient demonstrated greatly increased trunk sway with eyes open rhomberg.  Patient also demonstrates increased sway with ambulation.  Patient continues to benefit from PT for balance training, gait training and stair training.    Progression toward goals:  [X]       Improving appropriately and progressing toward goals  [ ]       Improving slowly and progressing toward goals  [ ]       Not making progress toward goals and plan of care will be adjusted       PLAN:   Patient continues to benefit from skilled intervention to address the above impairments. Continue treatment per established plan of care.  Discharge Recommendations:  Skilled Nursing Facility  Further Equipment Recommendations for  Discharge:  Rolling walker       SUBJECTIVE:   Patient stated ???I am feeling much better today.???      OBJECTIVE DATA SUMMARY:   Critical Behavior:  Neurologic State: Alert  Orientation Level: Oriented X4  Cognition: Appropriate for age attention/concentration;Follows commands  Safety/Judgement: Decreased awareness of environment;Decreased awareness of need for assistance;Decreased awareness of need for safety;Home safety;Fall prevention  Functional Mobility Training:  Bed Mobility:  Rolling: Additional time;Assist X1;CGA;Safety concerns;Verbal cues  Supine to Sit: Additional time;CGA;Safety concerns;Verbal cues  Transfers:  Sit to Stand: Additional time;Minimum assistance;Safety concerns;Verbal cues  Stand to Sit: Additional time;Minimum assistance;Safety concerns;Verbal cues  Bed to Chair: CGA;Safety concerns;Verbal cues (w/HHA)  Balance:  Sitting: Impaired  Sitting - Static: Good (unsupported)  Sitting - Dynamic: Fair (occasional)  Standing: Impaired  Standing - Static: Fair  Standing - Dynamic : Fair  Ambulation/Gait Training:  Distance (ft): 20 Feet (ft)  Assistive Device: Walker, rolling  Ambulation - Level of Assistance: Minimal assistance  Gait Abnormalities: Ataxic;Decreased step clearance;Path deviations;Trunk sway increased  Base of Support: Center of gravity altered  Speed/Cadence: Shuffled;Slow  Step Length: Left shortened;Right shortened  Interventions: Safety awareness training  Stairs:  Stairs - Level of Assistance: Unable at this time  Neuro Re-Education:  Eyes open Rhomberg  4 x 30 sec  Pain:  Pain Scale 1: Numeric (0 - 10)  Pain Intensity 1: 0  Activity Tolerance:   Fair  Please refer to the flowsheet for vital signs taken during this treatment.  After treatment:   [ ]  Patient left  in no apparent distress sitting up in chair  [X]  Patient being taken to ultrasound  [ ]  Call bell left within reach  [ ]  Nursing notified  [ ]  Caregiver present  [ ]  Bed alarm activated      Rosalene Billings   Time  Calculation: 18 mins

## 2012-07-08 NOTE — Other (Addendum)
Bedside and Verbal shift change report given to Cristina Gong RN (oncoming nurse) by Su Hoff, RN (offgoing nurse).  Report given with SBAR, Kardex MAR, results.

## 2012-07-08 NOTE — Progress Notes (Signed)
Progress Note    Patient: Jeremy Walters MRN: [id number]  SSN: [id number]    Date of Birth: 08/02/1961  Age: 51 y.o.  Sex: male      Admit Date: 07/03/2012    LOS: 5 days     Subjective:   bp mild up and down  No fever  Urine output good.  Pt sleeping, no complaints, no sob or cp or abd pain      Objective:     Current Facility-Administered Medications   Medication Dose Route Frequency   ??? potassium chloride 10 mEq in 100 ml IVPB  10 mEq IntraVENous Q1H   ??? potassium chloride (K-DUR, KLOR-CON) SR tablet 40 mEq  40 mEq Oral NOW   ??? magnesium oxide (MAG-OX) tablet 400 mg  400 mg Oral BID   ??? 0.9% sodium chloride infusion  75 mL/hr IntraVENous CONTINUOUS   ??? ceFAZolin (ANCEF) 1 g in 0.9% sodium chloride (MBP/ADV) 50 mL MBP  1 g IntraVENous Q8H   ??? acetaminophen (TYLENOL) tablet 650 mg  650 mg Oral Q4H PRN   ??? pantoprazole (PROTONIX) injection 40 mg  40 mg IntraVENous Q12H   ??? thiamine (B-1) tablet 100 mg  100 mg Oral DAILY   ??? folic acid (FOLVITE) tablet 1 mg  1 mg Oral DAILY   ??? metoprolol (LOPRESSOR) tablet 25 mg  25 mg Oral BID   ??? ondansetron (ZOFRAN) injection 4 mg  4 mg IntraVENous Q4H PRN   ??? acetaminophen (TYLENOL) suppository 650 mg  650 mg Rectal Q6H PRN   ??? sodium chloride (NS) flush 5-10 mL  5-10 mL IntraVENous Q8H   ??? sodium chloride (NS) flush 5-10 mL  5-10 mL IntraVENous PRN   ??? haloperidol lactate (HALDOL) injection 1 mg  1 mg IntraVENous Q2H PRN   ??? LORazepam (ATIVAN) tablet 1-2 mg  1-2 mg Oral Q1H PRN   ??? LORazepam (ATIVAN) tablet 3-4 mg  3-4 mg Oral Q1H PRN   ??? chlordiazePOXIDE (LIBRIUM) capsule 10 mg  10 mg Oral QID       Filed Vitals:    07/07/12 1942 07/07/12 2307 07/08/12 0400 07/08/12 0709   BP: 111/82 136/93 135/91 140/91   Pulse: 87 86 84 85   Temp: 97.8 ??F (36.6 ??C) 98.1 ??F (36.7 ??C) 98.4 ??F (36.9 ??C) 98.3 ??F (36.8 ??C)   Resp: 18 18 18 18    Height:       Weight:       SpO2: 100% 100% 100% 100%            Intake and Output:  Current Shift: 09/03 0700 - 09/03 1859  In: 0   Out: 410  [Urine:400; Drains:10]  Last three shifts: 09/01 1900 - 09/03 0659  In: 3084.2 [P.O.:1320; I.V.:1764.2]  Out: 7175 [Urine:6500; Drains:75]    General A+Ox3; NAD  Head: Normocephalic, without obvious abnormality, atraumatic   Neck: supple, trachea midline   Lungs: clear to auscultation bilaterally   Heart: regular rate and rhythm, S1, S2 normal, no murmur, click, rub or gallop   Abdomen: soft, non-tender. Bowel sounds normal. Extremities: extremities normal, atraumatic, no cyanosis or edema   Skin: Skin color, texture, turgor normal. No rashes or lesions   Neurologic: Grossly normal      Lab/Data Review:    Recent Labs   Basename 07/08/12 0430 07/07/12 0630 07/06/12 0510    WBC 5.2 3.9* 3.6*    HGB 8.9* 8.8* 9.3*    HCT 25.9* 25.1* 26.7*  PLT 129* 72* 51*     Recent Labs   Basename 07/08/12 0430 07/07/12 0630 07/06/12 0510    NA 137 136 139    K 4.1 3.2* 3.7    CL 104 102 105    CO2 23 25 26     GLU 100* 104* 110*    BUN 2* 2* 3*    CREA 0.52* 0.62 0.65    CA 7.3* 7.2* 7.1*    MG 1.2* 1.1* --    PHOS -- -- --    ALB 2.0* -- 1.8*    TBIL 0.7 -- 0.9    SGOT 56* -- 64*    INR -- -- 1.0     No results found for this basename: PH:3,PCO2:3,PO2:3,HCO3:3,FIO2:3 in the last 72 hours    SUMMARY:  Left ventricle: Systolic function was normal by visual assessment.  Ejection fraction was estimated in the range of 55 % to 60 %. No obvious  wall motion abnormalities identified in the views obtained. Wall thickness  was mildly increased. There was mild concentric hypertrophy.          Assessment/Plan:     IMPRESSION:       Patient Active Problem List   Diagnosis Code   ??? Alcohol withdrawal seizure 291.81   ??? Seizure 780.39   ??? Thrombocytopenia, unspecified 287.5   ??? SVT (supraventricular tachycardia) 427.89   ??? Subdural hematoma, chronic 432.1   ??? DTs (delirium tremens) 291.0   ??? Melena 578.1   ??? Anemia, unspecified 285.9   ??? SIRS (systemic inflammatory response syndrome) 995.90   ??? Electrolyte abnormality 276.9   ??? Sepsis 995.91    ??? Abnormal liver enzymes 790.5      PLAN:         1. Known sz dx with non compliance with meds:  Neuro following  No sz x more then 48 hr,  On dilantin only due to expensive keppra and non compliance it was d/ced . dilantin dose reduced. Neuro aware of low platelet counts and sz meds, dose was reduced. Review of orders shows pt not on dilantin which he was suppose to be on po per Neuro notes. Added po dilantin.  Alcohol use, : per neuro not alcohol WD sz  No wd, Impulsive per sitter on and off- cont sitter  Banana bag changed to po due to mulitple IV lines for lytes replacements  Alcohol liver dx that is contributing to low platelet count, abnormal LFT.  pending  HIV, hep panel neg.  Alcohol cessation advised  Bacteremia;SIRS (systemic inflammatory response syndrome AEROBIC AND ANAEROBIC BOTTLES ESCHERICHIA COLI   ID followingDr. Gloris Manchester   On isolation for c.diff pending rpts for diarrhea, no more loose stools. D/c FMS  On Ancef    Abnormal LFT likely from alcohol liver dx, improving ASt, ALT. High a.phosphatase, to f/u with GI now and then outpt with pcp      H/o pancreatitis:  labs lipase,amylase ok      Thrombocytopenia, unspecified, coags normal,  Multifactorial from alcohol, liver dx, sz meds: Dr. Cyndra Numbers, pending labs by onco--improving  Anemia, unspecified - stable h and H  Stool pos for blood x 1 , neg rpt one    - GI consult, Dr. Adelene Idler following  - monitor for bleeding or brusies,     SVT (supraventricular tachycardia: was thought to be sec to abnormal lytes, correction in place, echo wnl    Subdural hematoma, chronic  Developing per 8/30 ct, CT head 9/1- Predominantly  old appearing left frontal subdural hygroma. Monitor clinically and outpt f/u    DTs (delirium tremens): no active DT's    cx-r: nodule: Poorly defined subcentimeter nodule in the right upper lobe  To f.u outpt      DVT prophylaxis SCDs  Disposition home with home health Vicente Serene, CM on      Signed By: Galvin Proffer, MD     July 08, 2012     [facility]  [facility]  Pager: [phone]  Office: [phone]

## 2012-07-08 NOTE — Progress Notes (Signed)
Gastrointestinal Progress Note    Patient Name: Jeremy Walters    [id number]'[id number] Date: 07/08/2012    Admit Date: 07/03/2012      Assessment:   1.  Resolved acute Seizure due to noncompliance with intake of anti-epileptic meds and/or alcohol withdrawal.  Seizure free x 24 hours.  2.  E. Coli bacteremia - etiology unclear.  Contaminant?  Subclinical SBP?  3.  Anemia and heme occult positive stool.  4.  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  5.  Pancytopenia probably due to chronic alcoholism r/o Cirrhosis.  6.  Hx of pancreatic pseudocyst complicating acute ETOH pancreatitis.  7.  Hx of chronic subdural hematoma.    Recommendation:   1.  Awaiting abdominal US today.  2.  Schedule EGD and Colonoscopy for tomorrow to evaluate anemia and heme occult positive stool.  Explained purpose, risks and benefit and he expressed understanding.  Risks not limited to bleeding, infection, perforation and adverse reaction to sedation explained.    Subjective:   More lucid and conversant.  States place, time and person appropriately.  Has good recollection of prior history of pancreatitis and endoscopic procedures.  No seizure activity x 24 hours.  No abdominal pain. No diarrhea or rectal bleeding.  Stool CS and C.diff toxin negative.  Viral hepatitis serologies negative.  Awaiting abdominal US scheduled for today.    Current Facility-Administered Medications   Medication Dose Route Frequency   ??? magnesium oxide (MAG-OX) tablet 400 mg  400 mg Oral BID   ??? magnesium sulfate 2 g/50 ml IVPB (premix or compounded)  2 g IntraVENous Q1H   ??? phenytoin (DILANTIN) oral suspension 200 mg  200 mg Oral DAILY   ??? potassium chloride 10 mEq in 100 ml IVPB  10 mEq IntraVENous Q1H   ??? potassium chloride (K-DUR, KLOR-CON) SR tablet 40 mEq  40 mEq Oral NOW   ??? magnesium oxide (MAG-OX) tablet 400 mg  400 mg Oral BID   ??? 0.9% sodium chloride infusion  75 mL/hr IntraVENous CONTINUOUS   ??? ceFAZolin (ANCEF) 1 g in 0.9% sodium chloride (MBP/ADV) 50 mL MBP   1 g IntraVENous Q8H   ??? acetaminophen (TYLENOL) tablet 650 mg  650 mg Oral Q4H PRN   ??? pantoprazole (PROTONIX) injection 40 mg  40 mg IntraVENous Q12H   ??? thiamine (B-1) tablet 100 mg  100 mg Oral DAILY   ??? folic acid (FOLVITE) tablet 1 mg  1 mg Oral DAILY   ??? metoprolol (LOPRESSOR) tablet 25 mg  25 mg Oral BID   ??? ondansetron (ZOFRAN) injection 4 mg  4 mg IntraVENous Q4H PRN   ??? acetaminophen (TYLENOL) suppository 650 mg  650 mg Rectal Q6H PRN   ??? sodium chloride (NS) flush 5-10 mL  5-10 mL IntraVENous Q8H   ??? sodium chloride (NS) flush 5-10 mL  5-10 mL IntraVENous PRN   ??? haloperidol lactate (HALDOL) injection 1 mg  1 mg IntraVENous Q2H PRN   ??? LORazepam (ATIVAN) tablet 1-2 mg  1-2 mg Oral Q1H PRN   ??? LORazepam (ATIVAN) tablet 3-4 mg  3-4 mg Oral Q1H PRN   ??? chlordiazePOXIDE (LIBRIUM) capsule 10 mg  10 mg Oral QID          Objective:     Patient Vitals for the past 24 hrs:   Temp Pulse Resp BP SpO2   07/08/12 0709 98.3 ??F (36.8 ??C) 85  18  140/91 mmHg 100 %   07/08/12 0400 98.4 ??F (36.9 ??C) 84  18  135/91 mmHg 100 %   07/07/12 2307 98.1 ??F (36.7 ??C) 86  18  136/93 mmHg 100 %   07/07/12 1942 97.8 ??F (36.6 ??C) 87  18  111/82 mmHg 100 %   07/07/12 1554 97.5 ??F (36.4 ??C) 85  18  113/69 mmHg 96 %         Intake/Output Summary (Last 24 hours) at 07/08/12 1213  Last data filed at 07/08/12 1200   Gross per 24 hour   Intake 1402.5 ml   Output   5535 ml   Net -4132.5 ml         General appearance: alert, cooperative, no distress, appears stated age  Head: Normocephalic, without obvious abnormality, atraumatic  Eyes: conjunctivae/corneas clear. PERRL, EOM's intact. Fundi benign  Lungs: clear to auscultation bilaterally  Heart: regular rate and rhythm, S1, S2 normal, no murmur, click, rub or gallop  Abdomen: soft, non-tender. Bowel sounds normal. No masses,  no organomegaly  Extremities: extremities normal, atraumatic, no cyanosis or edema    Data Review:    Labs: Results:   Chemistry Recent Labs   [facility] 07/08/12 0430  07/07/12 0630 07/06/12 0510    GLU 100* 104* 110*    NA 137 136 139    K 4.1 3.2* 3.7    CL 104 102 105    CO2 23 25 26     BUN 2* 2* 3*    CREA 0.52* 0.62 0.65    CA 7.3* 7.2* 7.1*    AGAP 10 9 8     BUCR 4* 3* 5*    TBIL 0.7 -- 0.9    GPT 25 -- 32    AP 505* -- 228*    TP 4.7* -- 4.3*    ALB 2.0* -- 1.8*    GLOB 2.7 -- 2.5    AGRAT 0.7* -- 0.7*    Estimated Creatinine Clearance: 136.3 ml/min (based on Cr of 0.52).   CBC w/Diff Recent Labs   Basename 07/08/12 0430 07/07/12 0630 07/06/12 0510    WBC 5.2 3.9* 3.6*    RBC 2.90* 2.86* 3.03*    HGB 8.9* 8.8* 9.3*    HCT 25.9* 25.1* 26.7*    PLT 129* 72* 51*    GRANS 51 57 65    LYMPH 27 25 20*    EOS 1 1 1       Cardiac Enzymes No results found for this basename: CPK:2,CKRMB:2,CKND1:2,TROIP:2,MYO:2 in the last 72 hours   Coagulation Recent Labs   Basename 07/06/12 0510    PTP 12.1    INR 1.0    APTT 30.2       Hepatitis Panel Lab Results   Component Value Date/Time    Hepatitis A, IgM NEGATIVE  07/05/2012 12:00 PM    Hepatitis B surface Ag <0.10 07/05/2012 12:00 PM    Hepatitis B core, IgM NEGATIVE  07/05/2012 12:00 PM      Amylase Lipase    Liver Enzymes Recent Labs   Basename 07/08/12 0430    TP 4.7*    ALB 2.0*    TBIL 0.7    AP 505*    SGOT 56*    GPT 25      Thyroid Studies No results found for this basename: T4,T3U,T3RU,TSH in the last 72 hours     Pathology pathology       Robby Sermon, MD  July 08, 2012

## 2012-07-08 NOTE — Progress Notes (Signed)
1945:  Pt resting in bed, sleeping but easily aroused.  Pt denies pain.  Sitter at bedside, pt in no apparent distress.  Call bell within reach.

## 2012-07-08 NOTE — Progress Notes (Signed)
Chaplain conducted a Follow up consultation and Spiritual Assessment for Jeremy Walters, who is a 51 y.o.,male.      The Chaplain provided the following Interventions:  Continue the relationship of care and support.   Listened empathically.  Offered prayer and assurance of continue prayer on patients behalf.   Chart reviewed.    The following outcomes were achieved:  Patient expressed gratitude for pastoral care visit.    Assessment:  There are no spiritual or religious issues which require Spiritual Care Services intervention at this time.     Plan:  Chaplains will continue to follow and will provide pastoral care on an as needed/requested basis.  Chaplain recommends bedside caregivers page chaplain on duty if patient shows signs of acute spiritual or emotional distress.     Chaplain Briscoe Deutscher, MDiv,   Board Certified Chaplain  [phone] - Office

## 2012-07-08 NOTE — Progress Notes (Signed)
Problem: Self Care Deficits Care Plan (Adult)  Goal: *Acute Goals and Plan of Care (Insert Text)  Occupational Therapy Goals  Initiated 07/04/2012 within 7 day(s).    1. Patient will perform grooming with supervision/set-up standing at sink for more than 5 minutes.  2. Patient will perform upper body dressing with independence.  3. Patient will perform lower body dressing with independence.  4. Patient will perform safe bed/chair to toilet transfers with supervision/set-up.  5. Patient will perform all aspects of toileting with supervision/set-up.  6. Patient will participate in upper extremity therapeutic exercise/activities with independence for 10 minutes.   7. Patient will utilize energy conservation techniques during functional activities with verbal, visual and tactile cues.   OCCUPATIONAL THERAPY TREATMENT    Patient: Jeremy Walters (51 y.o. male)  Date: 07/08/2012  Diagnosis: delirium tremens, seizure d/o, subdural hematoma, nausea/vomiting  Alcohol withdrawal seizure  Alcohol withdrawal seizure Alcohol withdrawal seizure       Precautions: Fall;Seizure;Contact  Chart, occupational therapy assessment, plan of care, and goals were reviewed.      ASSESSMENT:   Pt OOB seated in chair upon entry, however, remain with decrease activity tolerance and decrease dynamic standing balance limiting independence with functional transfers.  Progression toward goals:  [ ]           Improving appropriately and progressing toward goals  [X]           Improving slowly and progressing toward goals  [ ]           Not making progress toward goals and plan of care will be adjusted       PLAN:   Patient continues to benefit from skilled intervention to address the above impairments. Continue treatment per established plan of care.  Discharge Recommendations:  Rehab vs Home Health  Further Equipment Recommendations for Discharge:  N/A       SUBJECTIVE:   Patient stated ???I feel much better being out of that bed.???      OBJECTIVE DATA  SUMMARY:   Cognitive/Behavioral Status:  Neurologic State: Alert  Orientation Level: Oriented X4  Cognition: Appropriate for age attention/concentration;Follows commands  Safety/Judgement: Decreased awareness of environment;Decreased awareness of need for assistance;Decreased awareness of need for safety;Home safety;Fall prevention  Functional Mobility and Transfers for ADLs:              Bed Mobility:  Rolling:  (pt OOB seated in chair upon entry)              Transfers:  Sit to Stand: Additional time;CGA;Safety concerns;Verbal cues  Bed to Chair: CGA;Safety concerns;Verbal cues (w/HHA)  Balance:  Sitting: Intact  Sitting - Static: Good (unsupported)  Sitting - Dynamic: Fair (occasional)  Standing: Impaired  Standing - Static: Fair  Standing - Dynamic : Fair  ADL Intervention:  Grooming  Brushing/Combing Hair: Supervision/set-up    Therapeutic Exercises:   BUE TherEx w/yellow theraband 10x in mult planes    Pain:  Pain Scale 1: Numeric (0 - 10)  Pain Intensity 1: 0    Activity Tolerance:    Fair, pt fatigues easily    Please refer to the flowsheet for vital signs taken during this treatment.  After treatment:   [X]   Patient left in no apparent distress sitting up in chair  [ ]   Patient left in no apparent distress in bed  [X]   Call bell left within reach  [ ]   Nursing notified  [X]   sitter present  [ ]   Bed alarm activated  Leann  Dextradeur, COTA  Time Calculation: 25 mins

## 2012-07-08 NOTE — Progress Notes (Signed)
0930, patient up out of bed in recliner with no distress noted will continue to monitor.  1045, patient remains in chair denies any pain at this time.  1134, patient watching tv with sitter in room received am care.  1234, patient off the floor for producer with sitter at side.  1400, returned to floor after producer, patient question if he could have something to eat advised patient will call doctor for information. Dr. Ephriam Jenkins paged waiting for call back.

## 2012-07-08 NOTE — Progress Notes (Signed)
1530, patient resting in bed with eyes closed denies any pain at this time.  1650, no change in status.  1745, patient awake in bed eating dinner watching tv.  1855, advised patient of shift change, patient denies any discomfort at this time.  Bedside and Verbal shift change report given to K. Hospital doctor (Cabin crew) by V.Tressie Stalker RN Physiological scientist).  Report given with SBAR, Kardex and MAR.

## 2012-07-09 ENCOUNTER — Inpatient Hospital Stay

## 2012-07-09 LAB — CBC WITH AUTOMATED DIFF
ABS. BASOPHILS: 0 10*3/uL (ref 0.0–0.1)
ABS. EOSINOPHILS: 0.3 10*3/uL (ref 0.0–0.4)
ABS. LYMPHOCYTES: 1.9 10*3/uL (ref 0.8–3.5)
ABS. MONOCYTES: 0.6 10*3/uL (ref 0–1.0)
ABS. NEUTROPHILS: 3.3 10*3/uL (ref 1.8–8.0)
BAND NEUTROPHILS: 1 % (ref 0–5)
BASOPHILS: 0 % (ref 0–3)
EOSINOPHILS: 4 % (ref 0–5)
HCT: 30.9 % — ABNORMAL LOW (ref 36.0–48.0)
HGB: 10.5 g/dL — ABNORMAL LOW (ref 13.0–16.0)
LYMPHOCYTES: 30 % (ref 20–51)
MCH: 30.9 PG (ref 24.0–34.0)
MCHC: 34 g/dL (ref 31.0–37.0)
MCV: 90.9 FL (ref 74.0–97.0)
METAMYELOCYTES: 4 % — ABNORMAL HIGH
MONOCYTES: 9 % (ref 2–9)
MPV: 10.1 FL (ref 9.2–11.8)
NEUTROPHILS: 51 % (ref 42–75)
PLATELET: 251 10*3/uL (ref 135–420)
PROMYELOCYTES: 1 % — ABNORMAL HIGH
RBC: 3.4 M/uL — ABNORMAL LOW (ref 4.70–5.50)
RDW: 14.4 % (ref 11.6–14.5)
WBC: 6.4 10*3/uL (ref 4.6–13.2)

## 2012-07-09 LAB — METABOLIC PANEL, BASIC
Anion gap: 12 mmol/L (ref 3.0–18)
BUN/Creatinine ratio: 2 — ABNORMAL LOW (ref 12–20)
BUN: 1 MG/DL — ABNORMAL LOW (ref 7.0–18)
CO2: 22 MMOL/L (ref 21–32)
Calcium: 7.8 MG/DL — ABNORMAL LOW (ref 8.5–10.1)
Chloride: 107 MMOL/L (ref 100–108)
Creatinine: 0.55 MG/DL — ABNORMAL LOW (ref 0.6–1.3)
GFR est AA: >60 mL/min/{1.73_m2} (ref >60)
GFR est non-AA: >60 mL/min/{1.73_m2} (ref >60)
Glucose: 86 MG/DL (ref 74–99)
Potassium: 3.7 MMOL/L (ref 3.5–5.5)
Sodium: 141 MMOL/L (ref 136–145)

## 2012-07-09 LAB — PH, STOOL: pH, stool: 6 — ABNORMAL LOW (ref 7.0–7.5)

## 2012-07-09 LAB — PHENYTOIN: Phenytoin: 1.8 ug/mL — ABNORMAL LOW (ref 10.0–20.0)

## 2012-07-09 LAB — MAGNESIUM: Magnesium: 1.5 MG/DL — ABNORMAL LOW (ref 1.8–2.4)

## 2012-07-09 LAB — ALBUMIN: Albumin: 2.3 g/dL — ABNORMAL LOW (ref 3.4–5.0)

## 2012-07-09 LAB — FECAL FAT, QL
Fats, Total: NORMAL
Fecal Neutral Fats:: NORMAL

## 2012-07-09 MED ADMIN — magnesium citrate solution 296 mL: ORAL | @ 10:00:00 | NDC 00869068638

## 2012-07-09 MED ADMIN — phenytoin (DILANTIN) 400 mg in 0.9% sodium chloride 250 mL IVPB: INTRAVENOUS | @ 20:00:00 | NDC 00641049321

## 2012-07-09 MED ADMIN — chlordiazePOXIDE (LIBRIUM) capsule 10 mg: ORAL | @ 22:00:00 | NDC 51079037401

## 2012-07-09 MED ADMIN — pantoprazole (PROTONIX) tablet 40 mg: ORAL | @ 22:00:00 | NDC 51079005101

## 2012-07-09 MED ADMIN — metoprolol (LOPRESSOR) tablet 25 mg: ORAL | @ 12:00:00 | NDC 62584026511

## 2012-07-09 MED ADMIN — promethazine (PHENERGAN) tablet 25 mg: ORAL | @ 12:00:00 | NDC 68084015511

## 2012-07-09 MED ADMIN — pantoprazole (PROTONIX) injection 40 mg: INTRAVENOUS | @ 02:00:00 | NDC 00008400101

## 2012-07-09 MED ADMIN — sodium chloride (NS) flush 5-10 mL: INTRAVENOUS | @ 18:00:00 | NDC 87701099893

## 2012-07-09 MED ADMIN — chlordiazePOXIDE (LIBRIUM) capsule 10 mg: ORAL | @ 02:00:00 | NDC 51079037401

## 2012-07-09 MED ADMIN — chlordiazePOXIDE (LIBRIUM) capsule 10 mg: ORAL | @ 18:00:00 | NDC 51079037401

## 2012-07-09 MED ADMIN — ceFAZolin (ANCEF) 1 g in 0.9% sodium chloride (MBP/ADV) 50 mL MBP: INTRAVENOUS | @ 12:00:00 | NDC 00781345170

## 2012-07-09 MED ADMIN — magnesium oxide (MAG-OX) tablet 400 mg: ORAL | @ 22:00:00 | NDC 96295013573

## 2012-07-09 MED ADMIN — pantoprazole (PROTONIX) injection 40 mg: INTRAVENOUS | @ 12:00:00 | NDC 00008400101

## 2012-07-09 MED ADMIN — ceFAZolin (ANCEF) 1 g in 0.9% sodium chloride (MBP/ADV) 50 mL MBP: INTRAVENOUS | @ 22:00:00 | NDC 00781345170

## 2012-07-09 MED ADMIN — thiamine (B-1) tablet 100 mg: ORAL | @ 12:00:00 | NDC 90011015050

## 2012-07-09 MED ADMIN — chlordiazePOXIDE (LIBRIUM) capsule 10 mg: ORAL | @ 12:00:00 | NDC 51079037401

## 2012-07-09 MED ADMIN — peg 3350-Electrolytes-Vit C (MOVIPREP) oral solution 1 L: ORAL | @ 09:00:00 | NDC 65649020175

## 2012-07-09 MED ADMIN — ceFAZolin (ANCEF) 1 g in 0.9% sodium chloride (MBP/ADV) 50 mL MBP: INTRAVENOUS | @ 05:00:00 | NDC 00781345170

## 2012-07-09 MED ADMIN — phenytoin (DILANTIN) oral suspension 200 mg: ORAL | @ 12:00:00 | NDC 66689003601

## 2012-07-09 MED ADMIN — magnesium sulfate 2 g/50 ml IVPB (premix or compounded): INTRAVENOUS | @ 18:00:00 | NDC 00409672924

## 2012-07-09 MED ADMIN — 0.9% sodium chloride infusion: INTRAVENOUS | @ 12:00:00 | NDC 00409798309

## 2012-07-09 MED ADMIN — sodium chloride (NS) flush 5-10 mL: INTRAVENOUS | @ 02:00:00 | NDC 87701099893

## 2012-07-09 MED ADMIN — magnesium oxide (MAG-OX) tablet 400 mg: ORAL | @ 12:00:00 | NDC 96295013573

## 2012-07-09 MED ADMIN — folic acid (FOLVITE) tablet 1 mg: ORAL | @ 12:00:00 | NDC 62584089711

## 2012-07-09 MED FILL — CEFAZOLIN 1 GRAM SOLUTION FOR INJECTION: 1 gram | INTRAMUSCULAR | Qty: 1000

## 2012-07-09 MED FILL — PROTONIX 40 MG INTRAVENOUS SOLUTION: 40 mg | INTRAVENOUS | Qty: 40

## 2012-07-09 MED FILL — METOPROLOL TARTRATE 25 MG TAB: 25 mg | ORAL | Qty: 1

## 2012-07-09 MED FILL — CHLORDIAZEPOXIDE 5 MG CAP: 5 mg | ORAL | Qty: 2

## 2012-07-09 MED FILL — PHENYTOIN SODIUM 50 MG/ML IV: 50 mg/mL | INTRAVENOUS | Qty: 8

## 2012-07-09 MED FILL — LACTATED RINGERS IV: INTRAVENOUS | Qty: 1000

## 2012-07-09 MED FILL — MIDAZOLAM 1 MG/ML IJ SOLN: 1 mg/mL | INTRAMUSCULAR | Qty: 2

## 2012-07-09 MED FILL — VITAMIN B-1 100 MG TABLET: 100 mg | ORAL | Qty: 1

## 2012-07-09 MED FILL — FOLIC ACID 1 MG TAB: 1 mg | ORAL | Qty: 1

## 2012-07-09 MED FILL — PANTOPRAZOLE 40 MG TAB, DELAYED RELEASE: 40 mg | ORAL | Qty: 1

## 2012-07-09 MED FILL — MAGNESIUM SULFATE 2 GRAM/50 ML IVPB: 2 gram/50 mL (4 %) | INTRAVENOUS | Qty: 50

## 2012-07-09 MED FILL — MAGNESIUM OXIDE 400 MG TAB: 400 mg | ORAL | Qty: 1

## 2012-07-09 MED FILL — BD POSIFLUSH NORMAL SALINE 0.9 % INJECTION SYRINGE: INTRAMUSCULAR | Qty: 10

## 2012-07-09 MED FILL — PROMETHAZINE 25 MG TAB: 25 mg | ORAL | Qty: 1

## 2012-07-09 MED FILL — PHENYTOIN 100 MG/4 ML ORAL SUSP: 100 mg/4 mL | ORAL | Qty: 8

## 2012-07-09 MED FILL — FENTANYL CITRATE (PF) 50 MCG/ML IJ SOLN: 50 mcg/mL | INTRAMUSCULAR | Qty: 2

## 2012-07-09 NOTE — Procedures (Signed)
Procedures  by Robby Sermon, MD at 07/09/12 1202                Author: Robby Sermon, MD  Service: --  Author Type: Physician       Filed: 07/09/12 1207  Date of Service: 07/09/12 1202  Status: Signed          Editor: Robby Sermon, MD (Physician)            Procedure Orders        1. COLONOSCOPY [[id number]] ordered by Robby Sermon, MD at 07/08/12 1212                           Pre-procedure Diagnoses        1. Anemia [285.9]        2. Occult GI bleeding [792.1]                           Post-procedure Diagnoses        1. Colon polyp [211.3]                           Procedures        1. COLONOSCOPY [[id number] (Custom)]                                         Colonoscopy Report          Patient: Jeremy Walters  MRN: [id number]   SSN: [id number]          Date of Birth: 12-Mar-1961   Age: 51 y.o.   Sex: male         Date of Procedure: 07/09/2012      IMPRESSION:   1.  9 mm sessile polyp at the hepatic flexure.   2.  Sigmoid diverticulosis.   3.  Enlarged rectal veins.      RECOMMENDATIONS:   1.  Resume diet and meds today.   2.  Await results of polyp pathology.  Repeat in 3-5 years if either adenomatous or hyperplastic.   3.  Anticipate discharge from hospital soon if all other medical problems stable or resolved.      Indication:  Anemia - 285.9, Occult blood in stool - 792.1   Procedure Performed: Colonoscopy polypectomy (cold snare)   Endoscopist: Robby Sermon, MD   Assistant: Roney Jaffe - Endoscopy Technician-1   Danie Chandler, RN - Endoscopy RN-1      ASA: ASA 3 - Patient with moderate systemic disease with functional limitations   Mallampati Score: II (soft palate, uvula, fauces visible)   Anesthesia: MAC anesthesia   Endoscope: [id number]   Extent of Examination:cecum, which was identified by the ileocecal valve and appendiceal  orifice   Quality of Preparation:            [x]   Excellent    []   Very Good    []  Fair but adequate    []  Fair, inadequate    []   Poor          Technique:  Informed consent was obtained.  A safety timeout was performed.  The patient was placed in left lateral position. A perianal inspection and a digital rectal exam were performed.  Propofol-based  IV sedation was administered by  the anesthesia service. The patients vital signs were monitored at all times including heart rate, rhythm, blood pressure and oxygen saturation.       When adequate sedation was achieved the video colonoscope was introduced into the rectum and advanced under direct vision up to the cecum, which was identified by the ileocecal valve and appendiceal orifice. The cecum was identified by ileocecal valve,  appendiceal orifice and confluence of the colonic folds. The terminal ileum was not intubated.  With adequate insufflation and maneuvering of the withdrawing scope, the colonic mucosa was visualized carefully. Retroflexion was performed in the rectum  and the distal rectum visualized.  The patient tolerated the procedure very well and was transferred to recovery area.       Findings:   1.  9 mm sessile polyp at the hepatic flexure cold snared and removed.   2.  Mild sigmoid diverticulosis.   3.  Enlarged or prominent rectal veins.      EBL:  Mild      Specimen:            ID  Type  Source  Tests  Collected by  Time  Destination     1 : BX EG Junction Nodular mucosa R/O Barretts  Preservative      Robby Sermon, MD  07/09/2012 1133  Pathology     2 : BX R/O Barrett's  Preservative  Esophagus, Distal    Robby Sermon, MD  07/09/2012 1135  Pathology     3 : Polyp (cold snare)  Preservative  Hepatic Flexure    Robby Sermon, MD  07/09/2012 1150  Pathology           Robby Sermon, MD   July 09, 2012   12:02 PM

## 2012-07-09 NOTE — Procedures (Signed)
Procedures  by Robby Sermon, MD at 07/09/12 1155                Author: Robby Sermon, MD  Service: --  Author Type: Physician       Filed: 07/09/12 1202  Date of Service: 07/09/12 1155  Status: Signed          Editor: Robby Sermon, MD (Physician)            Procedure Orders        1. EGD [[id number]] ordered by Robby Sermon, MD at 07/08/12 1212                           Pre-procedure Diagnoses        1. Anemia [285.9]        2. Occult GI bleeding [792.1]                           Post-procedure Diagnoses        1. Reflux esophagitis [530.11]        2. Hiatal hernia [553.3]                           Procedures        1. EGD [[id number] (Custom)]                                      Endoscopy Procedure Note          Patient: Jeremy Walters  MRN: [id number]   SSN: [id number]          Date of Birth: 1961-05-13   Age: 51 y.o.   Sex: male         Date/Time:   07/09/2012 11:56 AM      Esophagogastroduodenoscopy (EGD) Procedure Note      Procedure: Esophagogastroduodenoscopy with biopsy      IMPRESSION:    1.  Grade C reflux esophagitis r/o Barrett's esophagus.   2.  Nodular mucosa at the EG junction.   3.  Hiatal hernia.       RECOMMENDATIONS:   1. Await results of biopsies. If Barrett's w/o dysplasia noted then repeat EGD in 12 months.   2. Daily PPI therapy.   3. Proceed with colonoscopy to further evaluate his anemia and positive FOBT.      Indication: Anemia and positive FOBT   Operator:  Robby Sermon, MD   Referring Provider:   PROVIDER UNKNOWN   History: The history and physical exam were reviewed and updated.    Endoscope: [id number]   Extent of Exam: second portion of the duodenum   ASA: ASA 3 - Patient with moderate systemic disease with functional limitations   Anethesia/Sedation:  MAC anesthesia      Description of the procedure:    The procedure was discussed with the patient including risks, benefits, alternatives including risks of iv sedation, bleeding, perforation and aspiration.  A  safety timeout was performed. The patient was placed in the left lateral decubitus position.   A bite block was placed.  Propofol-based IV sedation was administered by the anesthesia service.  The patients vital signs were monitored at all times including heart rate/rhythm, blood pressure and oxygen saturation.  The endoscope was then passed under  direct  visualization to the second portion of the duodenum.  The endoscope was then slowly withdrawn while visualizing the mucosa.  In the stomach a retroflexion was performed and gastric fundus and cardia visualized.  The endoscope was then slowly withdrawn.   The patient was then transferred to recovery in stable condition.                  Findings:     Esophagus:  Diffusely denuded/ eroded mucosa with areas  of scarring and mucosal island formation in the distal esophagus.    Z-line has proximally extended to about 32 cm from the EG junction.  Evaluated using blue light (NBI) without discrete flat lesion,  ulcer or mass.  Multiple biopsies were  obtained.  There were an approximally 10 mm region of nodular mucosa with villiform features  at the EG junction that was biopsied.  No stricture or varices.  Moderate sized hiatal hernia noted.     Stomach: The gastric mucosa was normal with no ulceration, mass, stricture.    No varices.    Duodenum: The duodenum mucosa was normal with no ulceration, mass, stricture and no evidence of villous atrophy.       Therapies:  Biopsies      Specimens:            ID  Type  Source  Tests  Collected by  Time  Destination     1 : BX EG Junction Nodular mucosa R/O Barretts  Preservative      Robby Sermon, MD  07/09/2012 1133  Pathology     2 : BX R/O Barrett's  Preservative  Esophagus, Distal    Robby Sermon, MD  07/09/2012 1135  Pathology     3 : Polyp (cold snare)  Preservative  Hepatic Flexure    Robby Sermon, MD  07/09/2012 1150  Pathology                   Complications:   None; patient tolerated the procedure well.       YQM:[id number]         Robby Sermon, MD   July 09, 2012   11:56 AM

## 2012-07-09 NOTE — Progress Notes (Addendum)
Infectious Disease Follow-up    Admit Date: 07/03/2012  v   Current Antibiotics:  Prior    Ancef-3  abx -5 of 14 Vancomycin, Zosyn and metronidazole    Microbiology: e. COLI pan-sensitive   Assessment:      BSI-E. Coli 3 of 3   With positive GIB suspect GI source   2 d neg.  Ancef   H/o murmur   > 2 D echo neg  GI workup per GI may explain the source of the e. Coli bacteremia   Can be switch pt cipro when ready for discharge to finish a 14 days course   Sepsis  Temp down wbc down  Continue rx    GIB   Colonoscopy:  9 mm sessile polyp at the hepatic flexure.   2. Sigmoid diverticulosis.   3. Enlarged rectal veins  EGD  Grade C reflux esophagitis r/o Barrett's esophagus.   2. Nodular mucosa at the EG junction.   3. Hiatal hernia.        Per GI   RUQ [place] neg   SZDZ   Thought to be ETOH withdrawal     Elevated NH3     plts down   ETOH liver disease     SDH   Head CT 07/03/12,"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 No evidence of acute hemorrhage or additional acute intracranial abnormality     Chronic/acute alcoholism               Active Hospital Problems    Diagnosis Date Noted   ??? Abnormal liver enzymes 07/08/2012   ??? Sepsis 07/05/2012   ??? 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 Thrombocytopenia, unspecified 07/04/2012     Overview Note:     Due to alcoholic liver disease   Monitor      ??? SVT (supraventricular tachycardia) 07/04/2012     Overview Note:     Possibly due to Abnormal electrolyte,   cardiac enzymes negative echo BB     ??? 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 No evidence of acute hemorrhage or additional acute intracranial abnormality."       ??? DTs (delirium tremens) 07/04/2012     Overview Note:     Alcohol withdrawal protocol   UDS 07/04/12 negative      ??? Melena 07/04/2012     Overview Note:     Guaiac stool positive  07/04/12   Stool studies      ??? Anemia, unspecified 07/04/2012     Overview Note:     Anemia profile      ??? SIRS (systemic inflammatory response syndrome) 07/04/2012     Overview Note:     Lactate 4.6  BCx 07/04/12 x 2  Fever tachycardic tachypnea   Vancomycin/zosyn     ??? Electrolyte abnormality 07/04/2012     Overview Note:     Hypokalemia hypophosphatemia      ??? Seizure      Overview Note:     keppra, dilantin, neurology            Subjective:     Interval notes reviewed. Pt feeling better. Temp down, wbc down, GI work up reviewed and may explain source of e. coli. Continue abx, 2 D neg. Can be switch to po cipro at discharge to finish a 14 days rx.  Current Facility-Administered Medications   Medication Dose Route Frequency   ??? lactated ringers infusion  75 mL/hr IntraVENous CONTINUOUS   ??? sodium chloride (NS) flush 5-10 mL  5-10 mL IntraVENous Q8H   ??? sodium chloride (NS) flush 5-10 mL  5-10 mL IntraVENous PRN   ??? magnesium oxide (MAG-OX) tablet 400 mg  400 mg Oral BID   ??? pantoprazole (PROTONIX) tablet 40 mg  40 mg Oral BID   ??? magnesium sulfate 2 g/50 ml IVPB (premix or compounded)  2 g IntraVENous ONCE   ??? phenytoin (DILANTIN) 400 mg in 0.9% sodium chloride 250 mL IVPB  400 mg IntraVENous NOW   ??? phenytoin (DILANTIN) oral suspension 200 mg  200 mg Oral DAILY   ??? peg 3350-Electrolytes-Vit C (MOVIPREP) oral solution 1 L  1 L Oral Q12H   ??? magnesium citrate solution 296 mL  296 mL Oral NOW   ??? promethazine (PHENERGAN) tablet 25 mg  25 mg Oral Q12H   ??? 0.9% sodium chloride infusion  75 mL/hr IntraVENous CONTINUOUS   ??? ceFAZolin (ANCEF) 1 g in 0.9% sodium chloride (MBP/ADV) 50 mL MBP  1 g IntraVENous Q8H   ??? acetaminophen (TYLENOL) tablet 650 mg  650 mg Oral Q4H PRN   ??? thiamine (B-1) tablet 100 mg  100 mg Oral DAILY   ??? folic acid (FOLVITE) tablet 1 mg  1 mg Oral DAILY   ??? metoprolol (LOPRESSOR) tablet 25 mg  25 mg Oral BID   ??? ondansetron (ZOFRAN) injection 4 mg  4 mg IntraVENous Q4H PRN   ??? acetaminophen  (TYLENOL) suppository 650 mg  650 mg Rectal Q6H PRN   ??? sodium chloride (NS) flush 5-10 mL  5-10 mL IntraVENous Q8H   ??? sodium chloride (NS) flush 5-10 mL  5-10 mL IntraVENous PRN   ??? haloperidol lactate (HALDOL) injection 1 mg  1 mg IntraVENous Q2H PRN   ??? LORazepam (ATIVAN) tablet 1-2 mg  1-2 mg Oral Q1H PRN   ??? LORazepam (ATIVAN) tablet 3-4 mg  3-4 mg Oral Q1H PRN   ??? chlordiazePOXIDE (LIBRIUM) capsule 10 mg  10 mg Oral QID         Objective:     BP 127/74   Pulse 74   Temp 96 ??F (35.6 ??C)   Resp 18   Ht 5\' 6"  (1.676 m)   Wt 56.7 kg (125 lb)   BMI 20.18 kg/m2   SpO2 100%    Temp (24hrs), Avg:97.5 ??F (36.4 ??C), Min:96 ??F (35.6 ??C), Max:98 ??F (36.7 ??C)        Labs: Results:   Chemistry Recent Labs   [facility] 07/09/12 0510 07/08/12 0430 07/07/12 0630    GLU 86 100* 104*    NA 141 137 136    K 3.7 4.1 3.2*    CL 107 104 102    CO2 22 23 25     BUN 1* 2* 2*    CREA 0.55* 0.52* 0.62    CA 7.8* 7.3* 7.2*    AGAP 12 10 9     BUCR 2* 4* 3*    TBIL -- 0.7 --    GPT -- 25 --    AP -- 505* --    TP -- 4.7* --    ALB 2.3* 2.0* --    GLOB -- 2.7 --    AGRAT -- 0.7* --      CBC w/Diff Recent Labs   [facility]ename 07/09/12 0510 07/08/12 0430 07/07/12 0630    WBC  6.4 5.2 3.9*    RBC 3.40* 2.90* 2.86*    HGB 10.5* 8.9* 8.8*    HCT 30.9* 25.9* 25.1*    PLT 251 129* 72*    GRANS 51 51 57    LYMPH 30 27 25     EOS 4 1 1         Microbiology Results  No results found for this basename: SDES:3,CULT:3 in the last 72 hours    Tawnya Crook, MD  July 09, 2012  [facility]  [phone]

## 2012-07-09 NOTE — Progress Notes (Signed)
Attempted PT, pt off floor for procedure (Endo).  Will continue to follow.  Enos Fling, GPTA

## 2012-07-09 NOTE — Other (Signed)
TRANSFER - IN REPORT:    Verbal report received from Pleywudu(name) on Jeremy Walters  being received from 2100(unit) for ordered procedure      Report consisted of patient???s Situation, Background, Assessment and   Recommendations(SBAR).     Information from the following report(s) SBAR was reviewed with the receiving nurse.    Opportunity for questions and clarification was provided.      Assessment completed upon patient???s arrival to unit and care assumed.

## 2012-07-09 NOTE — Progress Notes (Signed)
SLP attempted to see pt this am; however, NPO for EGD and Colonoscopy this day. Will resume treatment once NPO orders d/c'd after procedure.  -Felecia Jan, MS CCC-SLP

## 2012-07-09 NOTE — Progress Notes (Signed)
Neurology Progress Note    Patient ID:  Jeremy Walters  [id number]  51 y.o.  Sep 11, 1961    Subjective:      Patient had endoscopy today for his GI issue today and felt tired today because he did not sleep well last night. He han no recurrent seizures on current dilantin dose. His Dilantin free level is still pending. Total level was 1.8 today with albumin level 2.3 the corrected level was 3.2.     Current Facility-Administered Medications   Medication Dose Route Frequency   ??? lactated ringers infusion  75 mL/hr IntraVENous CONTINUOUS   ??? sodium chloride (NS) flush 5-10 mL  5-10 mL IntraVENous Q8H   ??? sodium chloride (NS) flush 5-10 mL  5-10 mL IntraVENous PRN   ??? magnesium oxide (MAG-OX) tablet 400 mg  400 mg Oral BID   ??? pantoprazole (PROTONIX) tablet 40 mg  40 mg Oral BID   ??? magnesium sulfate 2 g/50 ml IVPB (premix or compounded)  2 g IntraVENous ONCE   ??? phenytoin (DILANTIN) 400 mg in 0.9% sodium chloride 100 mL IVPB  400 mg IntraVENous NOW   ??? phenytoin (DILANTIN) oral suspension 200 mg  200 mg Oral DAILY   ??? peg 3350-Electrolytes-Vit C (MOVIPREP) oral solution 1 L  1 L Oral Q12H   ??? magnesium citrate solution 296 mL  296 mL Oral NOW   ??? promethazine (PHENERGAN) tablet 25 mg  25 mg Oral Q12H   ??? 0.9% sodium chloride infusion  75 mL/hr IntraVENous CONTINUOUS   ??? ceFAZolin (ANCEF) 1 g in 0.9% sodium chloride (MBP/ADV) 50 mL MBP  1 g IntraVENous Q8H   ??? acetaminophen (TYLENOL) tablet 650 mg  650 mg Oral Q4H PRN   ??? thiamine (B-1) tablet 100 mg  100 mg Oral DAILY   ??? folic acid (FOLVITE) tablet 1 mg  1 mg Oral DAILY   ??? metoprolol (LOPRESSOR) tablet 25 mg  25 mg Oral BID   ??? ondansetron (ZOFRAN) injection 4 mg  4 mg IntraVENous Q4H PRN   ??? acetaminophen (TYLENOL) suppository 650 mg  650 mg Rectal Q6H PRN   ??? sodium chloride (NS) flush 5-10 mL  5-10 mL IntraVENous Q8H   ??? sodium chloride (NS) flush 5-10 mL  5-10 mL IntraVENous PRN   ??? haloperidol lactate (HALDOL) injection 1 mg  1 mg IntraVENous Q2H PRN   ???  LORazepam (ATIVAN) tablet 1-2 mg  1-2 mg Oral Q1H PRN   ??? LORazepam (ATIVAN) tablet 3-4 mg  3-4 mg Oral Q1H PRN   ??? chlordiazePOXIDE (LIBRIUM) capsule 10 mg  10 mg Oral QID            Objective:     Patient Vitals for the past 8 hrs:   BP Temp Pulse Resp SpO2   07/09/12 1255 127/74 mmHg 96 ??F (35.6 ??C) 74  18  100 %   07/09/12 1209 111/69 mmHg - 76  16  100 %   07/09/12 1207 94/57 mmHg - 90  16  94 %   07/09/12 1204 111/72 mmHg - 80  12  98 %   07/09/12 0802 117/85 mmHg 97.9 ??F (36.6 ??C) 83  18  98 %           Lab Review   Recent Results (from the past 24 hour(s))   CBC WITH AUTOMATED DIFF    Collection Time    07/09/12  5:10 AM       Component Value Range  WBC 6.4  4.6 - 13.2 K/uL    RBC 3.40 (*) 4.70 - 5.50 M/uL    HGB 10.5 (*) 13.0 - 16.0 g/dL    HCT 16.1 (*) 09.6 - 48.0 %    MCV 90.9  74.0 - 97.0 FL    MCH 30.9  24.0 - 34.0 PG    MCHC 34.0  31.0 - 37.0 g/dL    RDW 04.5  40.9 - 81.1 %    PLATELET 251  135 - 420 K/uL    MPV 10.1  9.2 - 11.8 FL    NEUTROPHILS 51  42 - 75 %    BANDS 1  0 - 5 %    LYMPHOCYTES 30  20 - 51 %    MONOCYTES 9  2 - 9 %    EOSINOPHILS 4  0 - 5 %    BASOPHILS 0  0 - 3 %    METAMYELOCYTES 4 (*) 0 %    PROMYELOCYTES 1 (*) 0 %    ABS. NEUTROPHILS 3.3  1.8 - 8.0 K/UL    ABS. LYMPHOCYTES 1.9  0.8 - 3.5 K/UL    ABS. MONOCYTES 0.6  0 - 1.0 K/UL    ABS. EOSINOPHILS 0.3  0.0 - 0.4 K/UL    ABS. BASOPHILS 0.0  0.0 - 0.1 K/UL    RBC COMMENTS        Value: 1+ ANISOCYTOSIS      1+ MACROCYTOSIS      1+ MICROCYTOSIS    DF MANUAL     METABOLIC PANEL, BASIC    Collection Time    07/09/12  5:10 AM       Component Value Range    Sodium 141  136 - 145 MMOL/L    Potassium 3.7  3.5 - 5.5 MMOL/L    Chloride 107  100 - 108 MMOL/L    CO2 22  21 - 32 MMOL/L    Anion gap 12  3.0 - 18 mmol/L    Glucose 86  74 - 99 MG/DL    BUN 1 (*) 7.0 - 18 MG/DL    Creatinine 9.14 (*) 0.6 - 1.3 MG/DL    BUN/Creatinine ratio 2 (*) 12 - 20      GFR est-AA >60  >60 ml/min/1.58m2    GFR est non-AA >60  >60 ml/min/1.74m2    Calcium 7.8 (*)  8.5 - 10.1 MG/DL   MAGNESIUM    Collection Time    07/09/12  5:10 AM       Component Value Range    Magnesium 1.5 (*) 1.8 - 2.4 MG/DL   ALBUMIN    Collection Time    07/09/12  5:10 AM       Component Value Range    Albumin 2.3 (*) 3.4 - 5.0 g/dL   PHENYTOIN    Collection Time    07/09/12  5:10 AM       Component Value Range    Phenytoin 1.8 (*) 10.0 - 20.0 ug/mL       Additional comments: reviewed above.      NEUROLOGICAL EXAM:    He is awake and NAD, f/o commends and no dysaphsia. PERRL . Face symmetry and no focal weakness of his extremities.     Assessment:     Principal Problem:   *Alcohol withdrawal seizure (07/04/2012)  Active Problems:     Seizure ()   keppra, dilantin, neurology      Thrombocytopenia, unspecified (07/04/2012)   Due to alcoholic  liver disease    Monitor      SVT (supraventricular tachycardia) (07/04/2012)   Possibly due to Abnormal electrolyte,    cardiac enzymes negative echo BB     Subdural hematoma, chronic (07/04/2012)   Head CT 07/03/12,"Chronic left frontal and convexity subdural hygroma or    hematoma developing since prior study. No evidence of acute hemorrhage or    additional acute intracranial abnormality."        DTs (delirium tremens) (07/04/2012)   Alcohol withdrawal protocol    UDS 07/04/12 negative      Melena (07/04/2012)   Guaiac stool positive 07/04/12    Stool studies      Anemia, unspecified (07/04/2012)   Anemia profile      SIRS (systemic inflammatory response syndrome) (07/04/2012)   Lactate 4.6   BCx 07/04/12 x 2   Fever tachycardic tachypnea    Vancomycin/zosyn     Electrolyte abnormality (07/04/2012)   Hypokalemia hypophosphatemia      Sepsis (07/05/2012)     Abnormal liver enzymes (07/08/2012)      Plan:   Alcoholic withdrawal seizures: Will give one extra dose of Dilantin 400 mg due to sub theraputic level  To keep his level at lower therapeutic range.  Will keep Dilantin 200 mg qd and recheck his level tomorrow. .     SignedJudithe Modest, MD  07/09/2012  4:07 PM

## 2012-07-09 NOTE — Progress Notes (Signed)
Problem: Dysphagia (Adult)  Goal: *Acute Goals and Plan of Care (Insert Text)    Seen this afternoon for f/u dysphagia tx. A&Ox4. Increased appetite s/p procedure this am. Pt accepted regular solid, thin liquid snack. Consumed ~75% of therapeutic snack with 0 overt distress or s/s aspiration. Pt does present with mildly labored bolus manipulation and cohesion; however, provided moderate multimodal cues for increased rotary manip, decreasing rate/bite size, and lingual sweep, along with increased time, pt able to clear without distress. Recommend resume regular solid, thin liquid diet. SLP re-educated pt with regard to compensatory swallow strategies and aspiration/reflux precautions, including small bites/sips, alternate liquids/solids, decrease feeding rate, HOB > 45 with all po, and upright in bed at 30 degrees after po for at least 45 minutes. + response to education with immediate return. Benefits from encouragement and reinforcement. SLP will cont to follow.    REC:   Regular, thin liquid diet  HOB >45 for po intake, remain >30 for 30-45 minutes after po   Small bites/sips; alternate liquid/solid with slow feeding rate   Verbal cues to swallow twice per bite/sip  Oral care TID  Meds whole, one @ a time    Patient will:  Utilize compensatory swallow maneuvers (decrease bolus size, decrease rate of intake, cyclical ingestion, etc.) to increase swallow function/safety with min visual, verbal and/or tactile cues in 4/5 trials.     Tolerate least-restrictive diet utilizing compensatory swallow techniques (decrease bolus size, decrease rate of intake, cyclical ingestion, etc.) without overt s/sx aspiration/distress in 4/5 trials with min visual, verbal, and/or tactile cues    Demonstrate the ability to adequately self-monitor swallowing skills and perform appropriate compensatory techniques to reduce s/sx of aspiration and to safely consume least-restrictive diet with min verbal, visual and/or tactile cues in 4/5  trials.  Outcome: Progressing Towards Goal  SPEECH LANGUAGE PATHOLOGY DYSPHAGIA TREATMENT    Patient: Jeremy Walters (51 y.o. male)  Date: 07/09/2012  Diagnosis: delirium tremens, seizure d/o, subdural hematoma, nausea/vomiting  Alcohol withdrawal seizure  Alcohol withdrawal seizure  anemia, hemme positive stools Alcohol withdrawal seizure  Procedure(s) (LRB):  ESOPHAGOGASTRODUODENOSCOPY (EGD) (N/A)  COLONOSCOPY (N/A) Day of Surgery  Precautions: aspiration Contact;Fall;Seizure      ASSESSMENT:   See daily treatment note above.   Progression toward goals:  [X]          Improving appropriately and progressing toward goals  [ ]          Improving slowly and progressing toward goals  [ ]          Not making progress toward goals and plan of care will be adjusted       PLAN:   Recommendations and Planned Interventions:  Patient continues to benefit from skilled intervention to address the above impairments. Continue treatment per established plan of care.  Discharge Recommendations:  None       SUBJECTIVE:   Patient stated ???thank you; I was really hungry???.      OBJECTIVE:   Cognitive and Communication Status:  Neurologic State: Alert  Orientation Level: Oriented X4  Cognition: Appropriate for age attention/concentration  Perception: Appears intact  Perseveration: No perseveration noted  Safety/Judgement: Decreased awareness of environment;Decreased awareness of need for assistance;Decreased awareness of need for safety;Home safety;Fall prevention  Dysphagia Treatment:  Oral Assessment:  Oral Assessment  Labial: Decreased rate  Dentition: Natural  Oral Hygiene: Fair  Lingual: Decreased strength  Velum: No impairment  Mandible: No impairment  P.O. Trials:  Patient Position: 45 HOB  Vocal quality  prior to P.O.: No impairment  Consistency Presented: Thin liquid;Solid  How Presented: Self-fed/presented;Straw;Successive swallows     Bolus Acceptance: No impairment  Bolus Formation/Control: Impaired  Type of Impairment:  Mastication  Propulsion: No impairment  Oral Residue: Less than 10% of bolus;Lingual  Initiation of Swallow: No impairment  Laryngeal Elevation: Functional  Aspiration Signs/Symptoms: None  Pharyngeal Phase Characteristics: No impairment, issues, or problems   Effective Modifications: Alternate liquids/solids;Small sips and bites  Cues for Modifications: Minimal-moderate       Oral Phase Severity: Mild  Pharyngeal Phase Severity : Mild     Pain:  Pain Scale 1: Adult Nonverbal Pain Scale  Pain Intensity 1: 0     After treatment:   [ ]               Patient left in no apparent distress sitting up in chair  [X]               Patient left in no apparent distress in bed  [X]               Call bell left within reach  [ ]               Nursing notified  [ ]               Caregiver present  [ ]               Bed alarm activated      COMMUNICATION/EDUCATION:   [X]               Aspiration precautions    Felecia Jan, SLP  Time Calculation: 30 mins

## 2012-07-09 NOTE — Progress Notes (Signed)
Patient back to the unit. Patient awake and alert x4. No distress noted. Patient is asking for water at this time. Checked patient for gag-reflex. Patient still has no gag reflex.     1345> Rechecked patient's gag reflex. Still no reflex noted.    1400> Tried to give patient apple sauce. Patient swallowed with no difficulty. Water offered and patient swallowed without difficulty. Oral medication given at this point.

## 2012-07-09 NOTE — Other (Signed)
TRANSFER - OUT REPORT:    Verbal report given to [facility] RN(name) on Jeremy Walters  being transferred to 2100(unit) for routine progression of care       Report consisted of patient???s Situation, Background, Assessment and   Recommendations(SBAR).     Information from the following report(s) SBAR, Procedure Summary and MAR was reviewed with the receiving nurse.    Opportunity for questions and clarification was provided.

## 2012-07-09 NOTE — Progress Notes (Addendum)
07/09/2012 2010 Pt in bed watching TV no distress noted. Denies pain at this time, sitter at bedside safety and seizure precaution measures in place. No seizure activity reported or noted.    2131 Pt c/o nausea zofran 4 mg Iv administered. Pt a wake watching Tv. Sitter at bedside.     2245 Paged Dr Corinda Gubler returned call promptly order to discontinue sitter received.    2300 Sitter discontinued. Pt encouraged to keep safe, to call for assistance verbalized understanding.    0130 Pt sleeping breathing regular    0220 Pt. taken for CT head  0300 Pt. Back from CT made comfortable in bed.  0645 Pt sleeping no concerns voiced. Safety and seizure precautions in place. Pt follows commands and instructions.

## 2012-07-09 NOTE — Other (Addendum)
Bedside and Verbal shift change report given to Sister Cheral Marker, RN (oncoming nurse) by Reina Fuse, RN   (offgoing nurse).  Report given with SBAR, Kardex, Procedure Summary, Intake/Output, MAR and Recent Results.     1945> Paged Dr. Corinda Gubler again for order to discontinue sitter. Oncoming nurse, Sister Perpetua informed to follow-up order with Dr. Corinda Gubler.

## 2012-07-09 NOTE — Progress Notes (Signed)
Second attempt at PT, pt still off floor in Endo for procedure.  Will continue to follow. Enos Fling, GPTA

## 2012-07-09 NOTE — Progress Notes (Signed)
Progress Note    Patient: Jeremy Walters MRN: [id number]  SSN: [id number]    Date of Birth: [birth year]  Age: 51 y.o.  Sex: male      Admit Date: 07/03/2012    LOS: 6 days     Subjective:     No fever  Urine output good.  Pt getting ready to go endo lab for scope.  No complaints per RN  FMS out, having stool loose but less fq        Objective:     Current Facility-Administered Medications   Medication Dose Route Frequency   ??? magnesium oxide (MAG-OX) tablet 400 mg  400 mg Oral BID   ??? magnesium sulfate 2 g/50 ml IVPB (premix or compounded)  2 g IntraVENous Q1H   ??? phenytoin (DILANTIN) oral suspension 200 mg  200 mg Oral DAILY   ??? peg 3350-Electrolytes-Vit C (MOVIPREP) oral solution 1 L  1 L Oral Q12H   ??? magnesium citrate solution 296 mL  296 mL Oral NOW   ??? promethazine (PHENERGAN) tablet 25 mg  25 mg Oral Q12H   ??? 0.9% sodium chloride infusion  75 mL/hr IntraVENous CONTINUOUS   ??? ceFAZolin (ANCEF) 1 g in 0.9% sodium chloride (MBP/ADV) 50 mL MBP  1 g IntraVENous Q8H   ??? acetaminophen (TYLENOL) tablet 650 mg  650 mg Oral Q4H PRN   ??? pantoprazole (PROTONIX) injection 40 mg  40 mg IntraVENous Q12H   ??? thiamine (B-1) tablet 100 mg  100 mg Oral DAILY   ??? folic acid (FOLVITE) tablet 1 mg  1 mg Oral DAILY   ??? metoprolol (LOPRESSOR) tablet 25 mg  25 mg Oral BID   ??? ondansetron (ZOFRAN) injection 4 mg  4 mg IntraVENous Q4H PRN   ??? acetaminophen (TYLENOL) suppository 650 mg  650 mg Rectal Q6H PRN   ??? sodium chloride (NS) flush 5-10 mL  5-10 mL IntraVENous Q8H   ??? sodium chloride (NS) flush 5-10 mL  5-10 mL IntraVENous PRN   ??? haloperidol lactate (HALDOL) injection 1 mg  1 mg IntraVENous Q2H PRN   ??? LORazepam (ATIVAN) tablet 1-2 mg  1-2 mg Oral Q1H PRN   ??? LORazepam (ATIVAN) tablet 3-4 mg  3-4 mg Oral Q1H PRN   ??? chlordiazePOXIDE (LIBRIUM) capsule 10 mg  10 mg Oral QID       Filed Vitals:    07/08/12 1942 07/09/12 0053 07/09/12 0435 07/09/12 0802   BP: 110/80 126/89 118/91 117/85   Pulse: 78 75 90 83   Temp: 97.8 ??F (36.6  ??C) 98 ??F (36.7 ??C) 97.6 ??F (36.4 ??C) 97.9 ??F (36.6 ??C)   Resp: 18 18 18 18    Height:       Weight:       SpO2: 100% 100% 100% 98%            Intake and Output:  Current Shift: 09/04 0700 - 09/04 1859  In: 326.3 [I.V.:326.3]  Out: 1100   Last three shifts: 09/02 1900 - 09/04 0659  In: 5008.5 [P.O.:2616; I.V.:2392.5]  Out: 7935 [Urine:5900; Drains:35]    General A+Ox3; NAD  Head: Normocephalic, without obvious abnormality, atraumatic   Neck: supple, trachea midline   Lungs: clear to auscultation bilaterally   Heart: regular rate and rhythm, S1, S2 normal, no murmur, click, rub or gallop   Abdomen: soft, non-tender. Bowel sounds normal. Extremities: extremities normal, atraumatic, no cyanosis or edema   Skin: Skin color, texture, turgor normal. No rashes or lesions  Neurologic: Grossly normal      Lab/Data Review:    Recent Labs   Basename 07/09/12 0510 07/08/12 0430 07/07/12 0630    WBC 6.4 5.2 3.9*    HGB 10.5* 8.9* 8.8*    HCT 30.9* 25.9* 25.1*    PLT 251 129* 72*     Recent Labs   Basename 07/09/12 0510 07/08/12 0430 07/07/12 0630    NA 141 137 136    K 3.7 4.1 3.2*    CL 107 104 102    CO2 22 23 25     GLU 86 100* 104*    BUN 1* 2* 2*    CREA 0.55* 0.52* 0.62    CA 7.8* 7.3* 7.2*    MG 1.5* 1.2* 1.1*    PHOS -- -- --    ALB 2.3* 2.0* --    TBIL -- 0.7 --    SGOT -- 56* --    INR -- -- --     No results found for this basename: PH:3,PCO2:3,PO2:3,HCO3:3,FIO2:3 in the last 72 hours    SUMMARY:  Left ventricle: Systolic function was normal by visual assessment.  Ejection fraction was estimated in the range of 55 % to 60 %. No obvious  wall motion abnormalities identified in the views obtained. Wall thickness  was mildly increased. There was mild concentric hypertrophy.          Assessment/Plan:     IMPRESSION:       Patient Active Problem List   Diagnosis Code   ??? Alcohol withdrawal seizure 291.81   ??? Seizure 780.39   ??? Thrombocytopenia, unspecified 287.5   ??? SVT (supraventricular tachycardia) 427.89   ??? 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 DTs (delirium tremens) 291.0   ??? Melena 578.1   ??? Anemia, unspecified 285.9   ??? SIRS (systemic inflammatory response syndrome) 995.90   ??? Electrolyte abnormality 276.9   ??? Sepsis 995.91   ??? Abnormal liver enzymes 790.5      PLAN:       Primary:  1. Known sz dx with non compliance with meds:resolved with restart of meds  Neuro following  No sz x more then 48 hr,  On dilantin only due to expensive keppra and non compliance it was d/ced . dilantin dose reduced. Neuro aware of low platelet counts and sz meds, dose was reduced.   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  pending  HIV, hep panel neg.  Alcohol cessation advised  Bacteremia;SIRS (systemic inflammatory response syndrome , BSI-E. Coli 3 of 3   With positive GIB suspecting GI source    ID followingDr. Gloris Manchester , Can be switch pt cipro when ready for discharge to finish a 14 days course   On isolation for c.diff pending rpts for diarrhea, less loose stools  On Ancef    GI:  GI following   abdominal US today. -Borderline hepatomegaly with moderate hepatic steatosis    For EGD and Colonoscopy today to evaluate anemia and heme occult positive stool  Thrombocytopenia, unspecified, coags normal,  Multifactorial from alcohol, liver dx, sz meds: Dr. Cyndra Numbers, pending labs by onco--wnl counts   Anemia, unspecified - stable h and H  Stool pos for blood x 1 , neg rpt one  - monitor for bleeding  or brusies,      alcohol liver dx, causing Abnormal LFT, low platelet counts, High a.phosphatase, liver stetosis, to f/u with GI now and then outpt with pcp  H/o pancreatitis:  labs lipase,amylase ok  SVT (supraventricular tachycardia: was thought to be sec to abnormal lytes, correction in place, echo wnl    Subdural hematoma, chronic  Developing per 8/30 ct,rpt  CT head  9/1- Predominantly old appearing left frontal subdural hygroma with  trace   acute component cannot be excluded.  . Clinically pt is stable, will rpt CT head for follow up    Low mag-replace    DTs (delirium tremens): no active DT's    cx-r: nodule: Poorly defined subcentimeter nodule in the right upper lobe  To f.u outpt      DVT prophylaxis SCDs, ppi for gi  Disposition home with home health Vicente Serene, CM on      Signed By: Galvin Proffer, MD     July 09, 2012    [facility]  [facility]  Pager: [phone]  Office: [phone]

## 2012-07-09 NOTE — Procedures (Signed)
Endoscopy Procedure Note    Patient: Jeremy Walters MRN: [id number]  SSN: [id number]    Date of Birth: 02-17-1961  Age: 51 y.o.  Sex: male      Date/Time:  07/09/2012 11:56 AM    Esophagogastroduodenoscopy (EGD) Procedure Note    Procedure: Esophagogastroduodenoscopy with biopsy    IMPRESSION:   1.  Grade C reflux esophagitis r/o Barrett's esophagus.  2.  Nodular mucosa at the EG junction.  3.  Hiatal hernia.     RECOMMENDATIONS:  1. Await results of biopsies. If Barrett's w/o dysplasia noted then repeat EGD in 12 months.  2. Daily PPI therapy.  3. Proceed with colonoscopy to further evaluate his anemia and positive FOBT.    Indication: Anemia and positive FOBT  Operator:  Robby Sermon, MD  Referring Provider:   PROVIDER UNKNOWN  History: The history and physical exam were reviewed and updated.   Endoscope: [id number]  Extent of Exam: second portion of the duodenum  ASA: ASA 3 - Patient with moderate systemic disease with functional limitations  Anethesia/Sedation:  MAC anesthesia    Description of the procedure:   The procedure was discussed with the patient including risks, benefits, alternatives including risks of iv sedation, bleeding, perforation and aspiration.  A safety timeout was performed. The patient was placed in the left lateral decubitus position.  A bite block was placed.  Propofol-based IV sedation was administered by the anesthesia service.  The patients vital signs were monitored at all times including heart rate/rhythm, blood pressure and oxygen saturation.  The endoscope was then passed under direct visualization to the second portion of the duodenum.  The endoscope was then slowly withdrawn while visualizing the mucosa.  In the stomach a retroflexion was performed and gastric fundus and cardia visualized.  The endoscope was then slowly withdrawn.  The patient was then transferred to recovery in stable condition.                Findings:    Esophagus:  Diffusely denuded/ eroded mucosa with  areas of scarring and mucosal island formation in the distal esophagus.    Z-line has proximally extended to about 32 cm from the EG junction.  Evaluated using blue light (NBI) without discrete flat lesion,  ulcer or mass.  Multiple biopsies were obtained.  There were an approximally 10 mm region of nodular mucosa with villiform features  at the EG junction that was biopsied.  No stricture or varices.  Moderate sized hiatal hernia noted.    Stomach: The gastric mucosa was normal with no ulceration, mass, stricture.   No varices.   Duodenum: The duodenum mucosa was normal with no ulceration, mass, stricture and no evidence of villous atrophy.     Therapies:  Biopsies    Specimens:   ID Type Source Tests Collected by Time Destination   1 : BX EG Junction Nodular mucosa R/O Barretts Preservative   Robby Sermon, MD 07/09/2012 1133 Pathology   2 : BX R/O Barrett's Preservative Esophagus, Distal  Robby Sermon, MD 07/09/2012 1135 Pathology   3 : Polyp (cold snare) Preservative Hepatic Flexure  Robby Sermon, MD 07/09/2012 1150 Pathology               Complications:   None; patient tolerated the procedure well.    NGE:[id number]      Robby Sermon, MD  July 09, 2012  11:56 AM

## 2012-07-09 NOTE — Progress Notes (Addendum)
Pt has been calm and cooperative. No distress, fall or self injury behavior during the shift. Page Dr. Corinda Gubler for order to discontinue pt bedside sitter per Bernestine Amass (DON) because he does not met criterion for sitter.

## 2012-07-09 NOTE — Progress Notes (Signed)
Problem: Self Care Deficits Care Plan (Adult)  Goal: *Acute Goals and Plan of Care (Insert Text)  Occupational Therapy Goals  Initiated 07/04/2012 within 7 day(s).    1. Patient will perform grooming with supervision/set-up standing at sink for more than 5 minutes.  2. Patient will perform upper body dressing with independence.  3. Patient will perform lower body dressing with independence.  4. Patient will perform safe bed/chair to toilet transfers with supervision/set-up.  5. Patient will perform all aspects of toileting with supervision/set-up.  6. Patient will participate in upper extremity therapeutic exercise/activities with independence for 10 minutes.   7. Patient will utilize energy conservation techniques during functional activities with verbal, visual and tactile cues.   OCCUPATIONAL THERAPY TREATMENT    Patient: Jeremy Walters (51 y.o. male)  Date: 07/09/2012  Diagnosis: delirium tremens, seizure d/o, subdural hematoma, nausea/vomiting  Alcohol withdrawal seizure  Alcohol withdrawal seizure  anemia, hemme positive stools Alcohol withdrawal seizure  Procedure(s) (LRB):  ESOPHAGOGASTRODUODENOSCOPY (EGD) (N/A)  COLONOSCOPY (N/A)    Precautions: Contact;Fall;Seizure  Chart, occupational therapy assessment, plan of care, and goals were reviewed.      ASSESSMENT:   Pt requiring increase assistance this date secondary to prepping for EGD and colonoscopy.   Progression toward goals:  [ ]           Improving appropriately and progressing toward goals  [X]           Improving slowly and progressing toward goals  [ ]           Not making progress toward goals and plan of care will be adjusted       PLAN:   Patient continues to benefit from skilled intervention to address the above impairments. Continue treatment per established plan of care.  Discharge Recommendations:  Home Health  Further Equipment Recommendations for Discharge:   TBD       SUBJECTIVE:   Patient stated ???I've been going to the bathroom all night.???       OBJECTIVE DATA SUMMARY:   Cognitive/Behavioral Status:  Neurologic State: Alert  Orientation Level: Oriented X4  Cognition: Appropriate for age attention/concentration;Impulsive;Poor safety awareness  Safety/Judgement: Decreased awareness of environment;Decreased awareness of need for assistance;Decreased awareness of need for safety;Home safety;Fall prevention  Functional Mobility and Transfers for ADLs:              Bed Mobility:  Supine to Sit: Additional time;Minimal assistance;Safety concerns;Verbal cues  Sit to Supine: Additional time;CGA;Safety concerns;Verbal cues              Transfers:              Toilet Transfer : Moderate assistance (EOB <--> BSC xfer w/walker)              Balance:  Sitting: Impaired  Sitting - Static: Fair (occasional)  Sitting - Dynamic: Fair (occasional)  Standing: Impaired  Standing - Static: Fair  Standing - Dynamic : Fair (fair minus)  ADL Intervention:  Grooming  Washing Face: Supervision/set-up  Washing Hands: Supervision/set-up  Brushing Teeth: Supervision/set-up  Seated EOB    Toileting  Toileting Assistance: Maximum assistance    Pain:  Pain Scale 1: Numeric (0 - 10)  Pain Intensity 1: 0    Activity Tolerance:    Fair    Please refer to the flowsheet for vital signs taken during this treatment.  After treatment:   [ ]   Patient left in no apparent distress sitting up in chair  [X]   Patient left  in no apparent distress in bed  [X]   Call bell left within reach  [ ]   Nursing notified  [X]   sitter present  [ ]   Bed alarm activated    Leann  Dextradeur, COTA  Time Calculation: 30 mins

## 2012-07-09 NOTE — Other (Signed)
Bedside and Verbal shift change report given to Pleywudu/Grace, RNs (oncoming nurse) by Marcy Salvo, RN (offgoing nurse).  Report given with SBAR, Kardex, Intake/Output, MAR and Recent Results.

## 2012-07-09 NOTE — Progress Notes (Signed)
Assumed patient care. Patient is awake and alert. Hiccups noted. Will monitor.

## 2012-07-09 NOTE — Other (Signed)
TRANSFER - OUT REPORT:    Verbal report given to Shellee Milo, RN(name) on Jeremy Walters  being transferred to ENDO(unit) for ordered procedure       Report consisted of patient???s Situation, Background, Assessment and   Recommendations(SBAR).     Information from the following report(s) SBAR, Kardex, Procedure Summary, Intake/Output and MAR was reviewed with the receiving nurse.    Opportunity for questions and clarification was provided.

## 2012-07-09 NOTE — Progress Notes (Signed)
Patient is transferred to Endo per stretcher.

## 2012-07-09 NOTE — Progress Notes (Signed)
Post-Anesthesia Evaluation & Assessment    Visit Vitals   Item Reading   ??? BP 94/57   ??? Pulse 90   ??? Temp 97.9 ??F (36.6 ??C)   ??? Resp 16   ??? Ht 5\' 6"  (1.676 m)   ??? Wt 56.7 kg (125 lb)   ??? BMI 20.18 kg/m2   ??? SpO2 94%       Nausea/Vomiting: no nausea    Pain score (VAS): 0    Post-operative hydration adequate.    Mental status & Level of consciousness: orientation per pre-anesthetic level    Neurological status: moves all extremities, sensation grossly intact    Pulmonary status: airway patent, no supplemental oxygen required    Complications related to anesthesia: none    Additional comments:        Hulan Fess, CRNA  July 09, 2012

## 2012-07-09 NOTE — Progress Notes (Signed)
NUTRITION  Patient seen for:      [x]       Supplements      [x]         PO intake check   []       Food Allergies              [x]         Food Preferences/tolerances    []       Glycemic control           []         Education    []       Diet order clarification  []         Other:           ASSESSMENT/PLAN   Pt is 88% ideal wt with mildly depleted fat and somatic protein stores;  Severely depleted visceral protein status.  Pt sleeping very soundly during meal rounds this AM.  Will attempt to follow up later to check appetite status and to offer po supplements.    SUBJECTIVE/OBJECTIVE:   Information obtained from:  Sitter- states pt had been sleeping this AM and going down for a colonoscopy      Diet:  NPO(for colonoscopy); previously on dental soft, chopped meats, 2 gram Na and then clear liquid diet    Intake: [x]            Good     []            Fair      []            Poor    []     Inconsistent  Patient Vitals for the past 100 hrs:   % Diet Eaten   07/07/12 1720 100 %   07/07/12 1243 100 %   07/07/12 0923 0 %   07/06/12 1748 100 %     Ht - 66"  Wt - 56.7 kg  Ideal wt - 64.6 kg  (BMI=20.1 kg/m2)    Nutrition Problems Identified  []      None              []      Elevated blood sugars  []      Hypoglycemic episode(s)   []      Suboptimal po intake  []      Specified food preferences             []      Allergies    []      Difficulty chewing     []      Dentition   []      Nausea/Vomiting  []      Constipation   []      Diarrhea  [x]      Other: albumin of 2.3 (07/09/12)    PLAN:   []      Obtained/adjusted food preferences/tolerances and/or snacks options   []      Supplements added   []      HS snack added  []      Recommend adjustment in insulin/oral agents   []      Modify diet texture    []      Modify diet for food allergies      []      Educate patient  []      Assist with menu selection  []      Monitor po intake on meal rounds  [x]      Continue inpatient monitoring and intervention - will follow up later today as time allows to see  if diet  resumed and if pt interested in po supplement for increased protein.  []      Other:    Shyrl Numbers, MS, RD, CDE   Office:  [phone]  [place] Pager:  [phone]   .

## 2012-07-09 NOTE — Other (Signed)
TRANSFER - IN REPORT:    Verbal report received from Cherylann Parr, RN (name) on Jeremy Walters  being received from Endo(unit) for routine progression of care      Report consisted of patient???s Situation, Background, Assessment and   Recommendations(SBAR).     Information from the following report(s) SBAR, Kardex and Procedure Summary was reviewed with the receiving nurse.    Opportunity for questions and clarification was provided.      Assessment to be completed upon patient???s arrival to unit and care assumed.     Per nurse's report, patient will be transferred to [facility] for recovery before transfer back to unit.

## 2012-07-09 NOTE — Discharge Instructions (Signed)
Patient given follow up with Dr Dorann Lodge for 07/18/12 at 12:45 pm.

## 2012-07-09 NOTE — Procedures (Signed)
Colonoscopy Report    Patient: Jeremy Walters MRN: [id number]  SSN: [id number]    Date of Birth: 04-Sep-1961  Age: 51 y.o.  Sex: male      Date of Procedure: 07/09/2012    IMPRESSION:  1.  9 mm sessile polyp at the hepatic flexure.  2.  Sigmoid diverticulosis.  3.  Enlarged rectal veins.    RECOMMENDATIONS:  1.  Resume diet and meds today.  2.  Await results of polyp pathology.  Repeat in 3-5 years if either adenomatous or hyperplastic.  3.  Anticipate discharge from hospital soon if all other medical problems stable or resolved.    Indication:  Anemia - 285.9, Occult blood in stool - 792.1  Procedure Performed: Colonoscopy polypectomy (cold snare)  Endoscopist: Robby Sermon, MD  Assistant: Roney Jaffe - Endoscopy Technician-1  Danie Chandler, RN - Endoscopy RN-1    ASA: ASA 3 - Patient with moderate systemic disease with functional limitations  Mallampati Score: II (soft palate, uvula, fauces visible)  Anesthesia: MAC anesthesia  Endoscope: [id number]  Extent of Examination:cecum, which was identified by the ileocecal valve and appendiceal orifice  Quality of Preparation:     [x]   Excellent   []   Very Good   []  Fair but adequate   []  Fair, inadequate   []   Poor      Technique: Informed consent was obtained.  A safety timeout was performed.  The patient was placed in left lateral position. A perianal inspection and a digital rectal exam were performed.  Propofol-based IV sedation was administered by the anesthesia service. The patients vital signs were monitored at all times including heart rate, rhythm, blood pressure and oxygen saturation.     When adequate sedation was achieved the video colonoscope was introduced into the rectum and advanced under direct vision up to the cecum, which was identified by the ileocecal valve and appendiceal orifice. The cecum was identified by ileocecal valve, appendiceal orifice and confluence of the colonic folds. The terminal ileum was not intubated.  With adequate  insufflation and maneuvering of the withdrawing scope, the colonic mucosa was visualized carefully. Retroflexion was performed in the rectum and the distal rectum visualized.  The patient tolerated the procedure very well and was transferred to recovery area.     Findings:  1.  9 mm sessile polyp at the hepatic flexure cold snared and removed.  2.  Mild sigmoid diverticulosis.  3.  Enlarged or prominent rectal veins.    EBL:  Mild    Specimen:   ID Type Source Tests Collected by Time Destination   1 : BX EG Junction Nodular mucosa R/O Barretts Preservative   Robby Sermon, MD 07/09/2012 1133 Pathology   2 : BX R/O Barrett's Preservative Esophagus, Distal  Robby Sermon, MD 07/09/2012 1135 Pathology   3 : Polyp (cold snare) Preservative Hepatic Flexure  Robby Sermon, MD 07/09/2012 1150 Pathology       Robby Sermon, MD  July 09, 2012  12:02 PM

## 2012-07-10 LAB — CBC WITH AUTOMATED DIFF
ABS. BASOPHILS: 0 10*3/uL (ref 0.0–0.1)
ABS. EOSINOPHILS: 0.1 10*3/uL (ref 0.0–0.4)
ABS. LYMPHOCYTES: 1.5 10*3/uL (ref 0.8–3.5)
ABS. MONOCYTES: 1 10*3/uL (ref 0–1.0)
ABS. NEUTROPHILS: 2.1 10*3/uL (ref 1.8–8.0)
BAND NEUTROPHILS: 2 % (ref 0–5)
BASOPHILS: 0 % (ref 0–3)
EOSINOPHILS: 2 % (ref 0–5)
HCT: 25.7 % — ABNORMAL LOW (ref 36.0–48.0)
HGB: 8.6 g/dL — ABNORMAL LOW (ref 13.0–16.0)
LYMPHOCYTES: 29 % (ref 20–51)
MCH: 30.7 PG (ref 24.0–34.0)
MCHC: 33.5 g/dL (ref 31.0–37.0)
MCV: 91.8 FL (ref 74.0–97.0)
METAMYELOCYTES: 4 % — ABNORMAL HIGH
MONOCYTES: 19 % — ABNORMAL HIGH (ref 2–9)
MPV: 10.3 FL (ref 9.2–11.8)
MYELOCYTES: 3 % — ABNORMAL HIGH
NEUTROPHILS: 41 % — ABNORMAL LOW (ref 42–75)
PLATELET: 260 10*3/uL (ref 135–420)
RBC: 2.8 M/uL — ABNORMAL LOW (ref 4.70–5.50)
RDW: 14.7 % — ABNORMAL HIGH (ref 11.6–14.5)
WBC: 5.2 10*3/uL (ref 4.6–13.2)

## 2012-07-10 LAB — METABOLIC PANEL, BASIC
Anion gap: 11 mmol/L (ref 3.0–18)
BUN/Creatinine ratio: 4 — ABNORMAL LOW (ref 12–20)
BUN: 2 MG/DL — ABNORMAL LOW (ref 7.0–18)
CO2: 23 MMOL/L (ref 21–32)
Calcium: 7.2 MG/DL — ABNORMAL LOW (ref 8.5–10.1)
Chloride: 107 MMOL/L (ref 100–108)
Creatinine: 0.56 MG/DL — ABNORMAL LOW (ref 0.6–1.3)
GFR est AA: >60 mL/min/{1.73_m2} (ref >60)
GFR est non-AA: >60 mL/min/{1.73_m2} (ref >60)
Glucose: 109 MG/DL — ABNORMAL HIGH (ref 74–99)
Potassium: 3.6 MMOL/L (ref 3.5–5.5)
Sodium: 141 MMOL/L (ref 136–145)

## 2012-07-10 LAB — MAGNESIUM: Magnesium: 1.8 MG/DL (ref 1.8–2.4)

## 2012-07-10 LAB — PHOSPHORUS: Phosphorus: 2.1 MG/DL — ABNORMAL LOW (ref 2.5–4.9)

## 2012-07-10 LAB — PHENYTOIN: Phenytoin: 7.2 ug/mL — ABNORMAL LOW (ref 10.0–20.0)

## 2012-07-10 MED ADMIN — ceFAZolin (ANCEF) 1 g in 0.9% sodium chloride (MBP/ADV) 50 mL MBP: INTRAVENOUS | @ 21:00:00 | NDC 00781345170

## 2012-07-10 MED ADMIN — magnesium oxide (MAG-OX) tablet 400 mg: ORAL | @ 21:00:00 | NDC 96295013573

## 2012-07-10 MED ADMIN — lactated ringers infusion: INTRAVENOUS | @ 01:00:00 | NDC 11845118709

## 2012-07-10 MED ADMIN — lactated ringers infusion: INTRAVENOUS | @ 02:00:00 | NDC 00409795309

## 2012-07-10 MED ADMIN — sodium chloride (NS) flush 5-10 mL: INTRAVENOUS | @ 12:00:00 | NDC 87701099893

## 2012-07-10 MED ADMIN — ceFAZolin (ANCEF) 1 g in 0.9% sodium chloride (MBP/ADV) 50 mL MBP: INTRAVENOUS | @ 05:00:00 | NDC 00781345170

## 2012-07-10 MED ADMIN — thiamine (B-1) tablet 100 mg: ORAL | @ 13:00:00 | NDC 90011015050

## 2012-07-10 MED ADMIN — pantoprazole (PROTONIX) tablet 40 mg: ORAL | @ 13:00:00 | NDC 51079005101

## 2012-07-10 MED ADMIN — Potassium & Sodium Phosphates (NEUTRA-PHOS) packet 2 Packet: ORAL | @ 15:00:00

## 2012-07-10 MED ADMIN — pantoprazole (PROTONIX) tablet 40 mg: ORAL | @ 21:00:00 | NDC 51079005101

## 2012-07-10 MED ADMIN — chlordiazePOXIDE (LIBRIUM) capsule 10 mg: ORAL | @ 13:00:00 | NDC 51079037401

## 2012-07-10 MED ADMIN — folic acid (FOLVITE) tablet 1 mg: ORAL | @ 13:00:00 | NDC 62584089711

## 2012-07-10 MED ADMIN — sodium chloride (NS) flush 5-10 mL: INTRAVENOUS | @ 02:00:00 | NDC 87701099893

## 2012-07-10 MED ADMIN — metoprolol (LOPRESSOR) tablet 25 mg: ORAL | @ 13:00:00 | NDC 62584026511

## 2012-07-10 MED ADMIN — ondansetron (ZOFRAN) injection 4 mg: INTRAVENOUS | @ 02:00:00 | NDC 00409475503

## 2012-07-10 MED ADMIN — chlordiazePOXIDE (LIBRIUM) capsule 10 mg: ORAL | @ 02:00:00 | NDC 51079037401

## 2012-07-10 MED ADMIN — chlordiazePOXIDE (LIBRIUM) capsule 10 mg: ORAL | @ 17:00:00 | NDC 51079037401

## 2012-07-10 MED ADMIN — chlordiazePOXIDE (LIBRIUM) capsule 10 mg: ORAL | @ 21:00:00 | NDC 51079037401

## 2012-07-10 MED ADMIN — Potassium & Sodium Phosphates (NEUTRA-PHOS) packet 2 Packet: ORAL | @ 18:00:00

## 2012-07-10 MED ADMIN — Potassium & Sodium Phosphates (NEUTRA-PHOS) packet 2 Packet: ORAL | @ 17:00:00

## 2012-07-10 MED ADMIN — sodium chloride (NS) flush 5-10 mL: INTRAVENOUS | @ 18:00:00 | NDC 87701099893

## 2012-07-10 MED ADMIN — ceFAZolin (ANCEF) 1 g in 0.9% sodium chloride (MBP/ADV) 50 mL MBP: INTRAVENOUS | @ 13:00:00 | NDC 00781345170

## 2012-07-10 MED ADMIN — magnesium oxide (MAG-OX) tablet 400 mg: ORAL | @ 13:00:00 | NDC 96295013573

## 2012-07-10 MED ADMIN — metoprolol (LOPRESSOR) tablet 25 mg: ORAL | @ 21:00:00 | NDC 62584026511

## 2012-07-10 MED ADMIN — phenytoin (DILANTIN) oral suspension 200 mg: ORAL | @ 13:00:00 | NDC 66689003601

## 2012-07-10 MED FILL — ONDANSETRON (PF) 4 MG/2 ML INJECTION: 4 mg/2 mL | INTRAMUSCULAR | Qty: 2

## 2012-07-10 MED FILL — CEFAZOLIN 1 GRAM SOLUTION FOR INJECTION: 1 gram | INTRAMUSCULAR | Qty: 1000

## 2012-07-10 MED FILL — PHOS-NAK 280 MG-160 MG-250 MG ORAL POWDER PACKET: 280-160-250 mg | ORAL | Qty: 2

## 2012-07-10 MED FILL — FOLIC ACID 1 MG TAB: 1 mg | ORAL | Qty: 1

## 2012-07-10 MED FILL — CHLORDIAZEPOXIDE 5 MG CAP: 5 mg | ORAL | Qty: 2

## 2012-07-10 MED FILL — MAGNESIUM OXIDE 400 MG TAB: 400 mg | ORAL | Qty: 1

## 2012-07-10 MED FILL — METOPROLOL TARTRATE 25 MG TAB: 25 mg | ORAL | Qty: 1

## 2012-07-10 MED FILL — VITAMIN B-1 100 MG TABLET: 100 mg | ORAL | Qty: 1

## 2012-07-10 MED FILL — PHENYTOIN 100 MG/4 ML ORAL SUSP: 100 mg/4 mL | ORAL | Qty: 8

## 2012-07-10 MED FILL — DIPRIVAN 10 MG/ML INTRAVENOUS EMULSION: 10 mg/mL | INTRAVENOUS | Qty: 1

## 2012-07-10 MED FILL — LACTATED RINGERS IV: INTRAVENOUS | Qty: 1000

## 2012-07-10 MED FILL — PANTOPRAZOLE 40 MG TAB, DELAYED RELEASE: 40 mg | ORAL | Qty: 1

## 2012-07-10 NOTE — Progress Notes (Signed)
Chaplain completed follow up visit with and Spiritual assessment of patient and offered Pastoral care, see flow sheets for interventions. Chaplains will continue to follow and will provide pastoral care on an as needed/requested basis    Chaplain Ernie Etheridge   Board Certified Chaplain   Spiritual Care   [phone]

## 2012-07-10 NOTE — Progress Notes (Signed)
Neurology Progress Note    Patient ID:  Jeremy Walters  [id number]  51 y.o.  1961-02-10    Subjective:      Patient is doing well and had no recurrent seizures. Marland Kitchen His Dilantin free level is still pending. Total level was 7 today with albumin level 2.3 the corrected level was 12.5.     Current Facility-Administered Medications   Medication Dose Route Frequency   ??? Potassium & Sodium Phosphates (NEUTRA-PHOS) packet 2 Packet  2 Packet Oral Q2H   ??? lactated ringers infusion  75 mL/hr IntraVENous CONTINUOUS   ??? sodium chloride (NS) flush 5-10 mL  5-10 mL IntraVENous Q8H   ??? sodium chloride (NS) flush 5-10 mL  5-10 mL IntraVENous PRN   ??? magnesium oxide (MAG-OX) tablet 400 mg  400 mg Oral BID   ??? sodium chloride (NS) flush 5-10 mL  5-10 mL IntraVENous PRN   ??? pantoprazole (PROTONIX) tablet 40 mg  40 mg Oral BID   ??? phenytoin (DILANTIN) oral suspension 200 mg  200 mg Oral DAILY   ??? ceFAZolin (ANCEF) 1 g in 0.9% sodium chloride (MBP/ADV) 50 mL MBP  1 g IntraVENous Q8H   ??? acetaminophen (TYLENOL) tablet 650 mg  650 mg Oral Q4H PRN   ??? thiamine (B-1) tablet 100 mg  100 mg Oral DAILY   ??? folic acid (FOLVITE) tablet 1 mg  1 mg Oral DAILY   ??? metoprolol (LOPRESSOR) tablet 25 mg  25 mg Oral BID   ??? ondansetron (ZOFRAN) injection 4 mg  4 mg IntraVENous Q4H PRN   ??? acetaminophen (TYLENOL) suppository 650 mg  650 mg Rectal Q6H PRN   ??? sodium chloride (NS) flush 5-10 mL  5-10 mL IntraVENous Q8H   ??? sodium chloride (NS) flush 5-10 mL  5-10 mL IntraVENous PRN   ??? haloperidol lactate (HALDOL) injection 1 mg  1 mg IntraVENous Q2H PRN   ??? LORazepam (ATIVAN) tablet 1-2 mg  1-2 mg Oral Q1H PRN   ??? LORazepam (ATIVAN) tablet 3-4 mg  3-4 mg Oral Q1H PRN   ??? chlordiazePOXIDE (LIBRIUM) capsule 10 mg  10 mg Oral QID            Objective:     Patient Vitals for the past 8 hrs:   BP Temp Pulse Resp SpO2   07/10/12 1540 93/64 mmHg 97 ??F (36.1 ??C) 87  17  99 %   07/10/12 1147 115/82 mmHg 97.2 ??F (36.2 ??C) 88  18  100 %   07/10/12 0907 - - 84  - -            Lab Review   Recent Results (from the past 24 hour(s))   METABOLIC PANEL, BASIC    Collection Time    07/10/12  4:58 AM       Component Value Range    Sodium 141  136 - 145 MMOL/L    Potassium 3.6  3.5 - 5.5 MMOL/L    Chloride 107  100 - 108 MMOL/L    CO2 23  21 - 32 MMOL/L    Anion gap 11  3.0 - 18 mmol/L    Glucose 109 (*) 74 - 99 MG/DL    BUN 2 (*) 7.0 - 18 MG/DL    Creatinine 4.09 (*) 0.6 - 1.3 MG/DL    BUN/Creatinine ratio 4 (*) 12 - 20      GFR est-AA >60  >60 ml/min/1.69m2    GFR est non-AA >60  >60 ml/min/1.38m2  Calcium 7.2 (*) 8.5 - 10.1 MG/DL   CBC WITH AUTOMATED DIFF    Collection Time    07/10/12  4:58 AM       Component Value Range    WBC 5.2  4.6 - 13.2 K/uL    RBC 2.80 (*) 4.70 - 5.50 M/uL    HGB 8.6 (*) 13.0 - 16.0 g/dL    HCT 62.1 (*) 30.8 - 48.0 %    MCV 91.8  74.0 - 97.0 FL    MCH 30.7  24.0 - 34.0 PG    MCHC 33.5  31.0 - 37.0 g/dL    RDW 65.7 (*) 84.6 - 14.5 %    PLATELET 260  135 - 420 K/uL    MPV 10.3  9.2 - 11.8 FL    NEUTROPHILS 41 (*) 42 - 75 %    BANDS 2  0 - 5 %    LYMPHOCYTES 29  20 - 51 %    MONOCYTES 19 (*) 2 - 9 %    EOSINOPHILS 2  0 - 5 %    BASOPHILS 0  0 - 3 %    METAMYELOCYTES 4 (*) 0 %    MYELOCYTES 3 (*) 0 %    ABS. NEUTROPHILS 2.1  1.8 - 8.0 K/UL    ABS. LYMPHOCYTES 1.5  0.8 - 3.5 K/UL    ABS. MONOCYTES 1.0  0 - 1.0 K/UL    ABS. EOSINOPHILS 0.1  0.0 - 0.4 K/UL    ABS. BASOPHILS 0.0  0.0 - 0.1 K/UL    RBC COMMENTS 1+ POLYCHROMASIA      DF SMEAR SCANNED     MAGNESIUM    Collection Time    07/10/12  4:58 AM       Component Value Range    Magnesium 1.8  1.8 - 2.4 MG/DL   PHOSPHORUS    Collection Time    07/10/12  4:58 AM       Component Value Range    Phosphorus 2.1 (*) 2.5 - 4.9 MG/DL   PHENYTOIN    Collection Time    07/10/12  4:58 AM       Component Value Range    Phenytoin 7.2 (*) 10.0 - 20.0 ug/mL       Additional comments: reviewed above.      NEUROLOGICAL EXAM:    He is awake and NAD, f/o commends and no dysaphsia. PERRL . Face symmetry and no focal weakness of his  extremities.     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 No evidence of acute hemorrhage or    additional acute intracranial abnormality."        DTs (delirium tremens) (07/04/2012)   Alcohol withdrawal protocol    UDS 07/04/12 negative      Melena (07/04/2012)   Guaiac stool positive 07/04/12    Stool studies      Anemia, unspecified (07/04/2012)   Anemia profile      SIRS (systemic  inflammatory response syndrome) (07/04/2012)   Lactate 4.6   BCx 07/04/12 x 2   Fever tachycardic tachypnea    Vancomycin/zosyn     Electrolyte abnormality (07/04/2012)   Hypokalemia hypophosphatemia      Sepsis (07/05/2012)     Abnormal liver enzymes (07/08/2012)      Plan:   Alcoholic withdrawal seizures: now level is low therapeutic and  Will keep the current dose  Dilantin 200 mg qd.    Signed:  Judithe Modest, MD  07/10/2012  4:07 PM

## 2012-07-10 NOTE — Progress Notes (Signed)
Progress Note    Patient: Jeremy Walters MRN: [id number]  SSN: [id number]    Date of Birth: 03/30/1961  Age: 51 y.o.  Sex: male      Admit Date: 07/03/2012    LOS: 7 days     Subjective:     No fever  Urine output good.  Pt states feels " lot better" eating well  rn rpts no complaints.  Reviewed all consult notes      Objective:     Current Facility-Administered Medications   Medication Dose Route Frequency   ??? lactated ringers infusion  75 mL/hr IntraVENous CONTINUOUS   ??? sodium chloride (NS) flush 5-10 mL  5-10 mL IntraVENous Q8H   ??? sodium chloride (NS) flush 5-10 mL  5-10 mL IntraVENous PRN   ??? magnesium oxide (MAG-OX) tablet 400 mg  400 mg Oral BID   ??? lactated ringers infusion  50 mL/hr IntraVENous CONTINUOUS   ??? sodium chloride (NS) flush 5-10 mL  5-10 mL IntraVENous PRN   ??? pantoprazole (PROTONIX) tablet 40 mg  40 mg Oral BID   ??? magnesium sulfate 2 g/50 ml IVPB (premix or compounded)  2 g IntraVENous ONCE   ??? phenytoin (DILANTIN) 400 mg in 0.9% sodium chloride 250 mL IVPB  400 mg IntraVENous NOW   ??? phenytoin (DILANTIN) oral suspension 200 mg  200 mg Oral DAILY   ??? 0.9% sodium chloride infusion  75 mL/hr IntraVENous CONTINUOUS   ??? ceFAZolin (ANCEF) 1 g in 0.9% sodium chloride (MBP/ADV) 50 mL MBP  1 g IntraVENous Q8H   ??? acetaminophen (TYLENOL) tablet 650 mg  650 mg Oral Q4H PRN   ??? thiamine (B-1) tablet 100 mg  100 mg Oral DAILY   ??? folic acid (FOLVITE) tablet 1 mg  1 mg Oral DAILY   ??? metoprolol (LOPRESSOR) tablet 25 mg  25 mg Oral BID   ??? ondansetron (ZOFRAN) injection 4 mg  4 mg IntraVENous Q4H PRN   ??? acetaminophen (TYLENOL) suppository 650 mg  650 mg Rectal Q6H PRN   ??? sodium chloride (NS) flush 5-10 mL  5-10 mL IntraVENous Q8H   ??? sodium chloride (NS) flush 5-10 mL  5-10 mL IntraVENous PRN   ??? haloperidol lactate (HALDOL) injection 1 mg  1 mg IntraVENous Q2H PRN   ??? LORazepam (ATIVAN) tablet 1-2 mg  1-2 mg Oral Q1H PRN   ??? LORazepam (ATIVAN) tablet 3-4 mg  3-4 mg Oral Q1H PRN   ??? chlordiazePOXIDE  (LIBRIUM) capsule 10 mg  10 mg Oral QID       Filed Vitals:    07/09/12 2000 07/10/12 0012 07/10/12 0445 07/10/12 0811   BP: 100/67 109/78 115/66 134/94   Pulse: 82 51 112 45   Temp: 97.6 ??F (36.4 ??C) 98.7 ??F (37.1 ??C) 98.9 ??F (37.2 ??C) 97.6 ??F (36.4 ??C)   Resp: 16 16 20 19    Height:       Weight:       SpO2: 100% 96% 100% 95%            Intake and Output:  Current Shift:    Last three shifts: 09/03 1900 - 09/05 0659  In: 4273.5 [P.O.:2616; I.V.:1657.5]  Out: 5550 [Urine:2200]    General A+Ox3; NAD  Head: Normocephalic, without obvious abnormality, atraumatic   Neck: supple, trachea midline   Lungs: clear to auscultation bilaterally   Heart: regular rate and rhythm, S1, S2 normal, no murmur, click, rub or gallop   Abdomen: soft, non-tender. Bowel  sounds normal. Extremities: extremities normal, atraumatic, no cyanosis or edema   Skin: Skin color, texture, turgor normal. No rashes or lesions   Neurologic: Grossly normal      Lab/Data Review:    Recent Labs   Basename 07/10/12 0458 07/09/12 0510 07/08/12 0430    WBC 5.2 6.4 5.2    HGB 8.6* 10.5* 8.9*    HCT 25.7* 30.9* 25.9*    PLT 260 251 129*     Recent Labs   Basename 07/10/12 0458 07/09/12 0510 07/08/12 0430    NA 141 141 137    K 3.6 3.7 4.1    CL 107 107 104    CO2 23 22 23     GLU 109* 86 100*    BUN 2* 1* 2*    CREA 0.56* 0.55* 0.52*    CA 7.2* 7.8* 7.3*    MG 1.8 1.5* 1.2*    PHOS 2.1* -- --    ALB -- 2.3* 2.0*    TBIL -- -- 0.7    SGOT -- -- 56*    INR -- -- --     No results found for this basename: PH:3,PCO2:3,PO2:3,HCO3:3,FIO2:3 in the last 72 hours    SUMMARY:  Left ventricle: Systolic function was normal by visual assessment.  Ejection fraction was estimated in the range of 55 % to 60 %. No obvious  wall motion abnormalities identified in the views obtained. Wall thickness  was mildly increased. There was mild concentric hypertrophy.      Procedure: Esophagogastroduodenoscopy with biopsy   IMPRESSION:   1. Grade C reflux esophagitis r/o Barrett's  esophagus.   2. Nodular mucosa at the EG junction.   3. Hiatal hernia.   RECOMMENDATIONS:   1. Await results of biopsies. If Barrett's w/o dysplasia noted then repeat EGD in 12 months.    IMPRESSION:   1. 9 mm sessile polyp at the hepatic flexure.   2. Sigmoid diverticulosis.   3. Enlarged rectal veins.   RECOMMENDATIONS:   1. Resume diet and meds today.   2. Await results of polyp pathology. Repeat in 3-5 years if either adenomatous or hyperplastic.   3. Anticipate discharge from hospital soon if all other medical problems stable or resolved.    IMPRESSION:   1. Diffuse atrophy which is out of proportion to patient's stated age but   unchanged in comparison to the prior exam.   2. Left frontal subdural hygroma, unchanged.      Assessment/Plan:     IMPRESSION:       Patient Active Problem List   Diagnosis Code   ??? Alcohol withdrawal seizure 291.81   ??? Seizure 780.39   ??? Thrombocytopenia, unspecified 287.5   ??? SVT (supraventricular tachycardia) 427.89   ??? Subdural hematoma, chronic 432.1   ??? DTs (delirium tremens) 291.0   ??? Melena 578.1   ??? Anemia, unspecified 285.9   ??? SIRS (systemic inflammatory response syndrome) 995.90   ??? Electrolyte abnormality 276.9   ??? Sepsis 995.91   ??? Abnormal liver enzymes 790.5      PLAN:       Primary:  1. Known sz dx with non compliance with meds:resolved with restart of meds  Neuro following  No sz x more then 72 hr,  On dilantin only due to expensive keppra and non compliance it was d/ced . dilantin dose reduced. 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  pending  HIV, hep panel neg.  Alcohol cessation advised  Bacteremia;SIRS (systemic inflammatory response syndrome , BSI-E. Coli 3 of 3   With positive GIB suspecting GI source    ID followingDr. Gloris Manchester , Can be switch pt cipro when ready for discharge to finish a 14 days course   On isolation for c.diff pending rpts for  diarrhea, less loose stools  On Ancef  GI:EGD shows Grade C reflux esophagitis r/o Barrett's esophagus bx pending, Nodular mucosa at the EG junction.,  Hiatal hernia.  GI following   abdominal US today. -Borderline hepatomegaly with moderate hepatic steatosis   Await results of biopsies. If Barrett's w/o dysplasia noted then repeat EGD in 12 months, Daily PPI therapy.  colonscope showed 1. 9 mm sessile polyp at the hepatic flexure, Sigmoid diverticulosis, Enlarged rectal veins.   f/u results of polyp pathology. Repeat in 3-5 years if either adenomatous or hyperplastic.     Heme:    Thrombocytopenia, resolved coags normal,  Multifactorial from alcohol, liver dx, sz meds: Dr. Cyndra Numbers, pending labs by onco  Anemia, mild drop in h and H, to f/u  Loose stools resolving  Stool pos for blood x 1 , neg rpt one  - monitor for bleeding or brusies,      alcohol liver dx, causing Abnormal LFT, low platelet counts, High a.phosphatase, liver stetosis, to f/u with GI now and then outpt with pcp  H/o pancreatitis:  labs lipase,amylase ok  SVT (supraventricular tachycardia: was thought to be sec to abnormal lytes, correction in place, echo wnl    Subdural hematoma,hygroma, chronic , subdural hygroma:    Left frontal subdural hygroma, unchanged with rpt CT. F/u outpt    Lytes: low phos to replace    DTs (delirium tremens): no active DT's    cx-r: nodule: Poorly defined subcentimeter nodule in the right upper lobe  To f.u outpt      DVT prophylaxis SCDs, ppi for gi  Disposition home with home health with walker depending on he does with PT today and if ok with ID , GI and Neuro      Signed By: Galvin Proffer, MD     July 10, 2012    [facility]  [facility]  Pager: [phone]  Office: [phone]

## 2012-07-10 NOTE — Progress Notes (Addendum)
Talked to Dr. Ephriam Jenkins over the phone, she said she will modify patient's Ciprofloxacin 500mg  BID to 9 days instead of 14 days and she will discontinue metoprolol 25mg . Script for metoprolol removed and shredded. Dr. Ephriam Jenkins will try to fax Ciprofloxacin script to patient's preferred Pharmacy.     1830> Dr Ephriam Jenkins called confirming that she successfully sent the script to patient's preferred pharmacy. The old prescription for ciprofloxacin needs to be removed.     1835> Script for ciprofloxacin 500mg  BID X 14 days removed and shredded.    1840> Discharge instructions, follow-up visits, and prescription explained to patient. Patient verbalized understanding.    1905> Faxed a cab voucher for patient and called the cab company to follow-up. Pick-up time will be 19:30.     1945> Patient discharged per wheelchair accompanied by [facility].

## 2012-07-10 NOTE — Progress Notes (Addendum)
Location: 2WNM - [id number]  Attn.: William Hamburger S  DOB: November 04, 1961 / Age: 51  MR#: [id number] / Admit#: [id number]  Pt. First Name: Jeremy  Pt. Last Name: Emmett Walters [place], [place]  [address]  [address]  [facility]                        Case Management - Progress Note  Initial Open Date: 07/08/2012  Case Manager:    Initial Open Date:  Social Worker:  Expected Date of Discharge:  Transferred From:  ECF Bed Held Until:  Bed Held By:  Power of Attorney:  POA/Guardian/Conservator Capacity:  Primary Caregiver:  Living Arrangements:  Source of Income: None/unable to assess  Payee:  Psychosocial History:  Cultural/Religious/Language Issues:  Education Level: Not able to assess/None  ADLS/Current Living Arrangements Issues: lives with a friend Glass blower/designer Page    Past Providers:  Will patient perform self care at discharge? U  Anticipated Discharge Disposition Goal:  Assessment/Plan:   07/10/2012 01:49P  Spoke with Dr. Ephriam Jenkins to inform her that  pt cannot go to SNF with no ins.  States will send pt home later with home  health.  Provided pt with indigent RW for home, P.T. will adjust. Referral  sent to [facility].  Pt.'s nurse made aware.  Available as needed.  Minette Headland [phone].         07/09/2012 09:57A  Chart reviewed.  Noted PT rec for SNF, pt has no ins,  so no payor source for SNF, can get home health with [facility] if needed,  will need orders @ discharge.  Will be able to provide indigent walker.  Plan home when stable.  Will cont to follow for further needs.  Minette Headland [phone].        07/08/2012 04:08P  Spoke with patient at bedside. Is alert and oriented  times 3. Sitter at the bedside.  Has been independent with adl's and  mobility. Is able to grocery shop and prepare his own meals. Does not have  a pcp. Is willing to have [facility] assist with a f/u MD and appointment.  Referral to [facility]. Case management following. Anthoney Harada RN  Resources at Discharge:  Service Providers  at Discharge:  Dictating Provider:  Merian Capron

## 2012-07-10 NOTE — Progress Notes (Signed)
Offered pt FOC for home care, he chose [facility]. I called referral to intake spoke with Mardella Layman.  Informed her the pt has intakes phone number and will call with dc address. Referral sent to intake via EDC.

## 2012-07-10 NOTE — Progress Notes (Signed)
Problem: Dysphagia (Adult)  Goal: *Acute Goals and Plan of Care (Insert Text)    Dysphagia tx completed. Pt reports + appetite, feels "much better." Sitting upright in bed self-feeding without difficulty this am. SLP observed pt with regular solid b/f, Cont to present with mildly labored bolus manipulation and cohesion; however, provided min verbal cues for increased rotary manip, decreasing rate/bite size, and lingual sweep, along with increased time, pt able to clear without distress. Mild impulsivity noted. + response to multimodal cueing with improved carryover noted during meal. Recommend cont regular solid, thin liquid diet. SLP re-educated pt with regard to compensatory swallow strategies and aspiration/reflux precautions, including small bites/sips, alternate liquids/solids, decrease feeding rate, HOB > 45 with all po, and upright in bed at 30 degrees after po for at least 45 minutes. + response to education with immediate return. SLP will f/u x1-2 visits for continued meal assessments and reinforcement of compensatory swallow measures.    REC:   Regular, thin liquid diet  HOB >45 for po intake, remain >30 for 30-45 minutes after po   Small bites/sips; alternate liquid/solid with slow feeding rate   Verbal cues to swallow twice per bite/sip  Oral care TID  Meds whole, one @ a time    Patient will:  Utilize compensatory swallow maneuvers (decrease bolus size, decrease rate of intake, cyclical ingestion, etc.) to increase swallow function/safety with min visual, verbal and/or tactile cues in 4/5 trials.     Tolerate least-restrictive diet utilizing compensatory swallow techniques (decrease bolus size, decrease rate of intake, cyclical ingestion, etc.) without overt s/sx aspiration/distress in 4/5 trials with min visual, verbal, and/or tactile cues    Demonstrate the ability to adequately self-monitor swallowing skills and perform appropriate compensatory techniques to reduce s/sx of aspiration and to safely  consume least-restrictive diet with min verbal, visual and/or tactile cues in 4/5 trials.  SPEECH LANGUAGE PATHOLOGY DYSPHAGIA TREATMENT    Patient: Jeremy Walters (51 y.o. male)  Date: 07/10/2012  Diagnosis: delirium tremens, seizure d/o, subdural hematoma, nausea/vomiting  Alcohol withdrawal seizure  Alcohol withdrawal seizure  anemia, hemme positive stools Alcohol withdrawal seizure  Procedure(s) (LRB):  ESOPHAGOGASTRODUODENOSCOPY (EGD) (N/A)  COLONOSCOPY (N/A) 1 Day Post-Op  Precautions:  Contact;Fall;Seizure      ASSESSMENT:   As above  Progression toward goals:  [X]          Improving appropriately and progressing toward goals  [ ]          Improving slowly and progressing toward goals  [ ]          Not making progress toward goals and plan of care will be adjusted       PLAN:   Recommendations and Planned Interventions:  As above  Patient continues to benefit from skilled intervention to address the above impairments. Continue treatment per established plan of care.  Discharge Recommendations:  None       SUBJECTIVE:   Patient stated ???Thanks???.      OBJECTIVE:   Cognitive and Communication Status:  Neurologic State: Alert  Orientation Level: Oriented X4  Cognition: Follows commands  Perception: Appears intact  Perseveration: No perseveration noted  Safety/Judgement: Decreased awareness of environment;Decreased awareness of need for assistance;Decreased awareness of need for safety;Home safety;Fall prevention  Dysphagia Treatment:  Oral Assessment:  Oral Assessment  Labial: Decreased rate  Dentition: Natural  Oral Hygiene: Fair  Lingual: Decreased strength  Velum: No impairment  Mandible: No impairment  P.O. Trials:  Patient Position: 45 HOB  Vocal quality prior  to P.O.: No impairment  Consistency Presented: Thin liquid;Solid  How Presented: Self-fed/presented;Straw;Successive swallows     Bolus Acceptance: No impairment  Bolus Formation/Control: Impaired  Type of Impairment: Mastication  Propulsion: No  impairment  Oral Residue: Less than 10% of bolus;Lingual  Initiation of Swallow: No impairment  Laryngeal Elevation: Functional  Aspiration Signs/Symptoms: None  Pharyngeal Phase Characteristics: No impairment, issues, or problems   Effective Modifications: Alternate liquids/solids;Small sips and bites  Cues for Modifications: Minimal-moderate       Oral Phase Severity: Mild  Pharyngeal Phase Severity : No impairment     After treatment:   [ ]               Patient left in no apparent distress sitting up in chair  [X]               Patient left in no apparent distress in bed  [ ]               Call bell left within reach  [ ]               Nursing notified  [ ]               Caregiver present  [ ]               Bed alarm activated    Hilbert Bible, M.S., CCC-SLP  Office: [phone]  Pager: [phone]

## 2012-07-10 NOTE — Progress Notes (Signed)
Problem: Self Care Deficits Care Plan (Adult)  Goal: *Acute Goals and Plan of Care (Insert Text)  Occupational Therapy Goals  Initiated 07/04/2012 within 7 day(s).    1. Patient will perform grooming with supervision/set-up standing at sink for more than 5 minutes.  2. Patient will perform upper body dressing with independence.  3. Patient will perform lower body dressing with independence.  4. Patient will perform safe bed/chair to toilet transfers with supervision/set-up.  5. Patient will perform all aspects of toileting with supervision/set-up.  6. Patient will participate in upper extremity therapeutic exercise/activities with independence for 10 minutes.   7. Patient will utilize energy conservation techniques during functional activities with verbal, visual and tactile cues.   OCCUPATIONAL THERAPY TREATMENT    Patient: Jeremy Walters (51 y.o. male)  Date: 07/10/2012  Diagnosis: delirium tremens, seizure d/o, subdural hematoma, nausea/vomiting  Alcohol withdrawal seizure  Alcohol withdrawal seizure  anemia, hemme positive stools Alcohol withdrawal seizure  Procedure(s) (LRB):  ESOPHAGOGASTRODUODENOSCOPY (EGD) (N/A)  COLONOSCOPY (N/A) 1 Day Post-Op  Precautions: Contact;Fall;Seizure  Chart, occupational therapy assessment, plan of care, and goals were reviewed.      ASSESSMENT:   Pt with increase activity tolerance ambulating to bathroom w/RW and completing toileting and hand hygiene at stand w/SBA. Pt educated on safety w/RW and home safety. Pt verbalized understanding.  Progression toward goals:  [X]           Improving appropriately and progressing toward goals  [ ]           Improving slowly and progressing toward goals  [ ]           Not making progress toward goals and plan of care will be adjusted       PLAN:   Patient continues to benefit from skilled intervention to address the above impairments. Continue treatment per established plan of care.  Discharge Recommendations:  Home Health  Further Equipment  Recommendations for Discharge:  rolling walker       SUBJECTIVE:   Patient stated ???i'm feeling much better today.???      OBJECTIVE DATA SUMMARY:   Cognitive/Behavioral Status:  Neurologic State: Alert  Orientation Level: Oriented X4  Cognition: Appropriate for age attention/concentration  Safety/Judgement: Decreased awareness of environment;Decreased awareness of need for assistance;Decreased awareness of need for safety;Home safety;Fall prevention  Functional Mobility and Transfers for ADLs:              Bed Mobility:  Supine to Sit: Additional time;Supervision;Verbal cues              Transfers:  Sit to Stand: Additional time;Supervision;Safety concerns;Verbal cues              Toilet Transfer : Stand-by assistance (w/grab bar)              Bathroom Mobility: Stand-by assistance (w/RW)              Balance:  Sitting: Impaired  Sitting - Static: Good (unsupported)  Sitting - Dynamic: Fair (occasional)  Standing: Impaired  Standing - Static: Fair  Standing - Dynamic : Fair  ADL Intervention:  Grooming  Washing Hands: Stand-by assistance    Toileting  Toileting Assistance: Stand-by assistance    Pain:  Pain Scale 1: Numeric (0 - 10)  Pain Intensity 1: 0    Activity Tolerance:    Fair    Please refer to the flowsheet for vital signs taken during this treatment.  After treatment:   [ ]   Patient left in  no apparent distress sitting up in chair  [X]   Patient left in no apparent distress seated edge of bed  [X]   Call bell left within reach  [ ]   Nursing notified  [ ]   Caregiver present  [ ]   Bed alarm activated    Leann  Dextradeur, COTA  Time Calculation: 25 mins

## 2012-07-10 NOTE — Progress Notes (Addendum)
Assumed patient care. Patient awake and alert. No complains of pain. Patient is cooperative and calm. Will monitor.    1200> Patient has 2 orders of continuous fluids. Called Dr. Ephriam Jenkins and she ordered to discontinue both since patient is eating and drinking well.    1327> Dr. Ephriam Jenkins to the nurse's station, ordered to discontinue the foley, bo bladder scan if no void and inform Dr Ephriam Jenkins.

## 2012-07-10 NOTE — Progress Notes (Signed)
Pt received RW for home delivered to room.  Adjusted walker to appropriate height, instructed pt on use of walker and safe sit<>stand transfers.  Enos Fling, GPTA

## 2012-07-10 NOTE — Progress Notes (Signed)
Problem: Mobility Impaired (Adult and Pediatric)  Goal: *Acute Goals and Plan of Care (Insert Text)  Physical Therapy Goals  Initiated 07/04/2012 and to be accomplished within 7 day(s)  1. Patient will move from supine to sit and sit to supine and roll side to side in bed with independence.   2. Patient will transfer from bed to chair and chair to bed with modified independence using the least restrictive device.  3. Patient will perform sit to stand with modified independence.  4. Patient will ambulate with modified independence for 100 feet with the least restrictive device.   5. Patient will ascend/descend 3 stairs with 1 handrail(s) with minimal assistance/contact guard assist.   PHYSICAL THERAPY TREATMENT    Patient: Jeremy Walters (51 y.o. male)  Date: 07/10/2012  Diagnosis: delirium tremens, seizure d/o, subdural hematoma, nausea/vomiting  Alcohol withdrawal seizure  Alcohol withdrawal seizure  anemia, hemme positive stools Alcohol withdrawal seizure  Procedure(s) (LRB):  ESOPHAGOGASTRODUODENOSCOPY (EGD) (N/A)  COLONOSCOPY (N/A) 1 Day Post-Op  Precautions: Contact;Fall;Seizure  Chart, physical therapy assessment, plan of care and goals were reviewed.      ASSESSMENT:   Pt able to transfer supine>sit>stand with supervision only. Pt ambulated using standard walker with verbal cues for head up and postural correction as pt ambulates with forward flexed posture.    Progression toward goals:  [X]       Improving appropriately and progressing toward goals  [ ]       Improving slowly and progressing toward goals  [ ]       Not making progress toward goals and plan of care will be adjusted       PLAN:   Patient continues to benefit from skilled intervention to address the above impairments. Continue treatment per established plan of care.  Discharge Recommendations:  Home Health  Further Equipment Recommendations for Discharge:  rolling walker and N/A       SUBJECTIVE:   Patient stated ???Not bad for my first walk.???       OBJECTIVE DATA SUMMARY:   Critical Behavior:  Neurologic State: Alert  Orientation Level: Oriented X4  Cognition: Follows commands  Safety/Judgement: Decreased awareness of environment;Decreased awareness of need for assistance;Decreased awareness of need for safety;Home safety;Fall prevention  Functional Mobility Training:  Bed Mobility:  Supine to Sit: Additional time;Supervision  Sit to Supine: Additional time  Transfers:  Sit to Stand: Additional time;Supervision;Safety concerns;Verbal cues  Stand to Sit: Additional time;Supervision;Safety concerns;Verbal cues  Balance:  Sitting: Impaired  Sitting - Static: Good (unsupported)  Sitting - Dynamic: Fair (occasional)  Standing: Impaired  Standing - Static: Fair  Standing - Dynamic : Fair  Ambulation/Gait Training:  Distance (ft): 120 Feet (ft)  Assistive Device: Walker  Ambulation - Level of Assistance: CGA  Gait Abnormalities: Antalgic (forward flexed)  Base of Support: Center of gravity altered  Speed/Cadence: Pace decreased (<100 feet/min);Shuffled  Step Length: Left shortened;Right shortened  Interventions: Verbal cues;Safety awareness training  Stairs:  Stairs - Level of Assistance: Unable at this time  Pain:  Pain Scale 1: Numeric (0 - 10)  Pain Intensity 1: 0  Activity Tolerance:   Fair  Please refer to the flowsheet for vital signs taken during this treatment.  After treatment:   [ ]  Patient left in no apparent distress sitting up in chair  [X]  Patient left in no apparent distress in bed  [X]  Call bell left within reach  [ ]  Nursing notified  [ ]  Caregiver present  [ ]  Bed alarm activated  Liborio Nixon, GPTA   Time Calculation: 21 mins

## 2012-07-10 NOTE — Progress Notes (Signed)
PROGRESS NOTE   PATIENT:  Jeremy Walters           MRN: [id number]           [facility], [birth year]01           07/10/2012, 12:06 PM      I  Mr. Spilde is a 51 y.o. male who is being seen for alcoholic liver disease, hem positive stool and anemia. He presented with alcohol withdrawal seizures, anemia adnd hem positive stool. Has E. Coli sepsis source not clear. EGD and colonoscopy did not show any source of infection. Findings as noted below. Pt feels btter. Tolerating diet. Has no GI complaints.        OBJECTIVE:  Patient Vitals for the past 24 hrs:   Temp Pulse Resp BP SpO2   07/10/12 1147 97.2 ??F (36.2 ??C) 88  18  115/82 mmHg 100 %   07/10/12 0907 - 84  - - -   07/10/12 0811 97.6 ??F (36.4 ??C) 45  19  134/94 mmHg 95 %   07/10/12 0445 98.9 ??F (37.2 ??C) 112  20  115/66 mmHg 100 %   07/10/12 0012 98.7 ??F (37.1 ??C) 51  16  109/78 mmHg 96 %   07/09/12 2000 97.6 ??F (36.4 ??C) 82  16  100/67 mmHg 100 %   07/09/12 1756 - 75  - 109/75 mmHg -   07/09/12 1630 97 ??F (36.1 ??C) 87  19  105/76 mmHg -   07/09/12 1255 96 ??F (35.6 ??C) 74  18  127/74 mmHg 100 %   07/09/12 1209 - 76  16  111/69 mmHg 100 %   07/09/12 1207 - 90  16  94/57 mmHg 94 %         Intake/Output Summary (Last 24 hours) at 07/10/12 1206  Last data filed at 07/10/12 0920   Gross per 24 hour   Intake   1890 ml   Output   2450 ml   Net   -560 ml       Gen: NAD  Heent: No pallor, icterus  Abd  : Soft, non tender, BS +, No masses felt.   Extremities  : No pedal edema B/L   Neuro: Alert, oriented X 3    Labs: Results:   Chemistry Recent Labs   [facility] 07/10/12 0458 07/09/12 0510 07/08/12 0430    GLU 109* 86 100*    NA 141 141 137    K 3.6 3.7 4.1    CL 107 107 104    CO2 23 22 23     BUN 2* 1* 2*    CREA 0.56* 0.55* 0.52*    CA 7.2* 7.8* 7.3*    AGAP 11 12 10     BUCR 4* 2* 4*    TBIL -- -- 0.7    GPT -- -- 25    AP -- -- 505*    TP -- -- 4.7*    ALB -- 2.3* 2.0*    GLOB -- -- 2.7    AGRAT -- -- 0.7*    Estimated Creatinine Clearance: 126.6 ml/min (based on Cr  of 0.56).   CBC w/Diff Recent Labs   [facility] 07/10/12 0458 07/09/12 0510 07/08/12 0430    WBC 5.2 6.4 5.2    RBC 2.80* 3.40* 2.90*    HGB 8.6* 10.5* 8.9*    HCT 25.7* 30.9* 25.9*    PLT 260 251 129*    GRANS 41* 51 51  LYMPH 29 30 27     EOS 2 4 1       Cardiac Enzymes No results found for this basename: CPK:2,CKRMB:2,CKND1:2,TROIP:2,MYO:2 in the last 72 hours   Coagulation No results found for this basename: PTP:2,INR:2,APTT:2 in the last 72 hours    Hepatitis Panel Lab Results   Component Value Date/Time    Hepatitis A, IgM NEGATIVE  07/05/2012 12:00 PM    Hepatitis B surface Ag <0.10 07/05/2012 12:00 PM    Hepatitis B core, IgM NEGATIVE  07/05/2012 12:00 PM      Amylase Lipase    Liver Enzymes Recent Labs   Basename 07/09/12 0510 07/08/12 0430    TP -- 4.7*    ALB 2.3* --    TBIL -- 0.7    AP -- 505*    SGOT -- 56*    GPT -- 25      Thyroid Studies No results found for this basename: T4,T3U,T3RU,TSH in the last 72 hours     Pathology pathology         Allergies   Allergen Reactions   ??? Morphine Rash and Nausea and Vomiting   ??? Shellfish Derived Rash and Nausea and Vomiting       Current Facility-Administered Medications   Medication Dose Route Frequency   ??? Potassium & Sodium Phosphates (NEUTRA-PHOS) packet 2 Packet  2 Packet Oral Q2H   ??? lactated ringers infusion  75 mL/hr IntraVENous CONTINUOUS   ??? sodium chloride (NS) flush 5-10 mL  5-10 mL IntraVENous Q8H   ??? sodium chloride (NS) flush 5-10 mL  5-10 mL IntraVENous PRN   ??? magnesium oxide (MAG-OX) tablet 400 mg  400 mg Oral BID   ??? sodium chloride (NS) flush 5-10 mL  5-10 mL IntraVENous PRN   ??? pantoprazole (PROTONIX) tablet 40 mg  40 mg Oral BID   ??? magnesium sulfate 2 g/50 ml IVPB (premix or compounded)  2 g IntraVENous ONCE   ??? phenytoin (DILANTIN) 400 mg in 0.9% sodium chloride 250 mL IVPB  400 mg IntraVENous NOW   ??? phenytoin (DILANTIN) oral suspension 200 mg  200 mg Oral DAILY   ??? ceFAZolin (ANCEF) 1 g in 0.9% sodium chloride (MBP/ADV) 50 mL MBP  1 g  IntraVENous Q8H   ??? acetaminophen (TYLENOL) tablet 650 mg  650 mg Oral Q4H PRN   ??? thiamine (B-1) tablet 100 mg  100 mg Oral DAILY   ??? folic acid (FOLVITE) tablet 1 mg  1 mg Oral DAILY   ??? metoprolol (LOPRESSOR) tablet 25 mg  25 mg Oral BID   ??? ondansetron (ZOFRAN) injection 4 mg  4 mg IntraVENous Q4H PRN   ??? acetaminophen (TYLENOL) suppository 650 mg  650 mg Rectal Q6H PRN   ??? sodium chloride (NS) flush 5-10 mL  5-10 mL IntraVENous Q8H   ??? sodium chloride (NS) flush 5-10 mL  5-10 mL IntraVENous PRN   ??? haloperidol lactate (HALDOL) injection 1 mg  1 mg IntraVENous Q2H PRN   ??? LORazepam (ATIVAN) tablet 1-2 mg  1-2 mg Oral Q1H PRN   ??? LORazepam (ATIVAN) tablet 3-4 mg  3-4 mg Oral Q1H PRN   ??? chlordiazePOXIDE (LIBRIUM) capsule 10 mg  10 mg Oral QID     Assessment:    1. Seizures secondary to alcohol withdrawal and medication non compliance. Resolved.    2. E. Coli bacteremia - etiology unclear.No ascites on U/S. No clear etiology seen on EGD and colonoscopy.   3. Anemia Likely secondary to  alcohol abuse   4. Hem positive stool  EGD showed hiatal hernia and nodular mucosa in the antrum, biopsies pending. Colonoscopy showed small polyps, diverticulosis and dilated rectal veins? Varices.   5. Alcohol-induced liver disease   6. Pancytopenia probably due to chronic alcoholism with possible  Cirrhosis.   7. Hx of pancreatic pseudocyst complicating acute ETOH pancreatitis.   8. Hx of chronic subdural hematoma.    Recommendations:   1. Counselled about quitting alcohol   2. Follow up on biopsy results. If adenomatous polyps recommend repeat colonoscopy in 3-5 yrs   3. Out pt follow up with Dr. Danelle Earthly   4. 2 gm sodium diet       Elberta Spaniel, MD

## 2012-07-10 NOTE — Other (Signed)
Bedside and Verbal shift change report given to [place]/ Delorise Shiner, RN (oncoming nurse) by Jacklyn Shell, RN   (offgoing nurse).  Report given with SBAR, Kardex, Intake/Output, MAR and Recent Results.

## 2012-07-10 NOTE — Progress Notes (Signed)
Attempted PT, pt refuses at this time, he would like at least half an hour to let his breakfast settle.  Will continue to follow.  Enos Fling, GPTA

## 2012-07-10 NOTE — Progress Notes (Signed)
Removed the foley catheter as ordered. Will monitor.

## 2012-07-10 NOTE — Progress Notes (Signed)
Infectious Disease Follow-up    Admit Date: 07/03/2012    Current abx Prior abx   Cefazolin 9/2 - 3  Abx 5 of 14      Assessment / Plan:     BSI - E coli 3 of 3 8/30 -> dc Cefazolin and give po Ciprofloxacin 500 mg bid x 9 days  -> OK to discharge from ID standpoint   Sepsis due to above.  Resolved -> abx as above   Reflux esophagitis on EGD 9/4 -> per GI   Sigmoid diverticulosis on colonoscopy 9/4, 1 polyp path - tubular adenoma -> per GI   GIB due to esophagitis  -> per GI   Seizure Disorder - ? ETOH    Chronic Subdural hygroma left frontal    ETOH abuse      Active Hospital Problems    Diagnosis Date Noted   ??? Abnormal liver enzymes 07/08/2012   ??? Sepsis 07/05/2012   ??? 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 Thrombocytopenia, unspecified 07/04/2012     Overview Note:     Due to alcoholic liver disease   Monitor      ??? SVT (supraventricular tachycardia) 07/04/2012     Overview Note:     Possibly due to Abnormal electrolyte,   cardiac enzymes negative echo BB     ??? 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 No evidence of acute hemorrhage or additional acute intracranial abnormality."       ??? DTs (delirium tremens) 07/04/2012     Overview Note:     Alcohol withdrawal protocol   UDS 07/04/12 negative      ??? Melena 07/04/2012     Overview Note:     Guaiac stool positive 07/04/12   Stool studies      ??? Anemia, unspecified 07/04/2012     Overview Note:     Anemia profile      ??? SIRS (systemic inflammatory response syndrome) 07/04/2012     Overview Note:     Lactate 4.6  BCx 07/04/12 x 2  Fever tachycardic tachypnea   Vancomycin/zosyn     ??? Electrolyte abnormality 07/04/2012     Overview Note:     Hypokalemia hypophosphatemia      ??? Seizure      Overview Note:     keppra, dilantin, neurology            Subjective:     Interval notes reviewed.  Feels well.  No diarrhea or abdominal pain.  No  fever or chills or sweats.    Current Facility-Administered Medications   Medication Dose Route Frequency   ??? Potassium & Sodium Phosphates (NEUTRA-PHOS) packet 2 Packet  2 Packet Oral Q2H   ??? lactated ringers infusion  75 mL/hr IntraVENous CONTINUOUS   ??? sodium chloride (NS) flush 5-10 mL  5-10 mL IntraVENous Q8H   ??? sodium chloride (NS) flush 5-10 mL  5-10 mL IntraVENous PRN   ??? magnesium oxide (MAG-OX) tablet 400 mg  400 mg Oral BID   ??? sodium chloride (NS) flush 5-10 mL  5-10 mL IntraVENous PRN   ??? pantoprazole (PROTONIX) tablet 40 mg  40 mg Oral BID   ??? phenytoin (DILANTIN) oral suspension 200 mg  200 mg Oral DAILY   ??? ceFAZolin (ANCEF) 1 g in 0.9% sodium chloride (MBP/ADV) 50 mL MBP  1 g IntraVENous Q8H   ???  acetaminophen (TYLENOL) tablet 650 mg  650 mg Oral Q4H PRN   ??? thiamine (B-1) tablet 100 mg  100 mg Oral DAILY   ??? folic acid (FOLVITE) tablet 1 mg  1 mg Oral DAILY   ??? metoprolol (LOPRESSOR) tablet 25 mg  25 mg Oral BID   ??? ondansetron (ZOFRAN) injection 4 mg  4 mg IntraVENous Q4H PRN   ??? acetaminophen (TYLENOL) suppository 650 mg  650 mg Rectal Q6H PRN   ??? sodium chloride (NS) flush 5-10 mL  5-10 mL IntraVENous Q8H   ??? sodium chloride (NS) flush 5-10 mL  5-10 mL IntraVENous PRN   ??? haloperidol lactate (HALDOL) injection 1 mg  1 mg IntraVENous Q2H PRN   ??? LORazepam (ATIVAN) tablet 1-2 mg  1-2 mg Oral Q1H PRN   ??? LORazepam (ATIVAN) tablet 3-4 mg  3-4 mg Oral Q1H PRN   ??? chlordiazePOXIDE (LIBRIUM) capsule 10 mg  10 mg Oral QID         Objective:     BP 115/83   Pulse 83   Temp 97 ??F (36.1 ??C)   Resp 17   Ht 5\' 6"  (1.676 m)   Wt 56.7 kg (125 lb)   BMI 20.18 kg/m2   SpO2 99%    Temp (24hrs), Avg:97.8 ??F (36.6 ??C), Min:97 ??F (36.1 ??C), Max:98.9 ??F (37.2 ??C)    Lungs - clear, heart - regular, Abdomen - soft, non-tender    Labs: Results:   Chemistry Recent Labs   [facility] 07/10/12 0458 07/09/12 0510 07/08/12 0430    GLU 109* 86 100*    NA 141 141 137    K 3.6 3.7 4.1    CL 107 107 104    CO2 23 22 23     BUN 2* 1*  2*    CREA 0.56* 0.55* 0.52*    CA 7.2* 7.8* 7.3*    AGAP 11 12 10     BUCR 4* 2* 4*    TBIL -- -- 0.7    GPT -- -- 25    AP -- -- 505*    TP -- -- 4.7*    ALB -- 2.3* 2.0*    GLOB -- -- 2.7    AGRAT -- -- 0.7*      CBC w/Diff Recent Labs   [facility] 07/10/12 0458 07/09/12 0510 07/08/12 0430    WBC 5.2 6.4 5.2    RBC 2.80* 3.40* 2.90*    HGB 8.6* 10.5* 8.9*    HCT 25.7* 30.9* 25.9*    PLT 260 251 129*    GRANS 41* 51 51    LYMPH 29 30 27     EOS 2 4 1         Microbiology Results  No results found for this basename: SDES:3,CULT:3 in the last 72 hours    Cree Napoli Girard Cooter, MD  July 10, 2012  [facility]  [phone]

## 2012-07-10 NOTE — Discharge Summary (Addendum)
Discharge Summary    Patient: Jeremy Walters               Sex: male          DOA: 07/03/2012         Date of Birth:  12/04/1960      Age:  51 y.o.        LOS:  LOS: 7 days                Admit Date: 07/03/2012    Discharge Date: 07/10/2012    Primary Care Physician:    Discharge Diagnoses:    Problem List as of 07/10/2012  Date Reviewed: 07-16-2012        Codes Class Noted - Resolved    Abnormal liver enzymes 790.5  07/08/2012 - Present        Sepsis 995.91  07/05/2012 - Present        *Alcohol withdrawal seizure 291.81  07/04/2012 - Present        Seizure 780.39  Unknown - Present    Overview    Signed 07/04/2012  9:20 AM by Jairo Ben, MD     keppra, dilantin, neurology           Thrombocytopenia, unspecified 287.5  07/04/2012 - Present    Overview    Addendum 07/04/2012  9:19 AM by Jairo Ben, MD     Due to alcoholic liver disease   Monitor           SVT (supraventricular tachycardia) 427.89  07/04/2012 - Present    Overview    Addendum 07/04/2012  9:46 AM by Jairo Ben, MD     Possibly due to Abnormal electrolyte,   cardiac enzymes negative echo BB          Subdural hematoma, 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 No evidence of acute hemorrhage or additional acute intracranial abnormality."            DTs (delirium tremens) 291.0  07/04/2012 - Present    Overview    Addendum 07/04/2012  9:43 AM by Jairo Ben, MD     Alcohol withdrawal protocol   UDS 07/04/12 negative           Melena 578.1  07/04/2012 - Present    Overview    Addendum 07/04/2012  4:20 PM by Jairo Ben, MD     Guaiac stool positive 07/04/12   Stool studies           Anemia, unspecified 285.9  07/04/2012 - Present    Overview    Signed 07/04/2012  9:39 AM by Jairo Ben, MD     Anemia profile           SIRS (systemic inflammatory response syndrome) 995.90   07/04/2012 - Present    Overview    Addendum 07/04/2012  9:47 AM by Jairo Ben, MD     Lactate 4.6  BCx 07/04/12 x 2  Fever tachycardic tachypnea   Vancomycin/zosyn          Electrolyte abnormality 276.9  07/04/2012 - Present    Overview    Signed 07/04/2012  9:47 AM by Jairo Ben, MD     Hypokalemia hypophosphatemia  Discharge Medications:     Current Discharge Medication List      START taking these medications    Details   folic acid (FOLVITE) 1 mg tablet Take 1 Tab by mouth daily for 30 days.  Qty: 30 Tab, Refills: 0      LORazepam (ATIVAN) 1 mg tablet Take 1-2 Tabs by mouth every six (6) hours as needed for 15 doses.  Qty: 15 Tab, Refills: 0      magnesium oxide (MAG-OX) 400 mg tablet Take 1 Tab by mouth two (2) times a day for 3 days.  Qty: 6 Tab, Refills: 0      metoprolol (LOPRESSOR) 25 mg tablet Take 1 Tab by mouth two (2) times a day for 30 days.  Qty: 60 Tab, Refills: 0      thiamine (B-1) 100 mg tablet Take 1 Tab by mouth daily for 30 days.  Qty: 30 Tab, Refills: 0      phenytoin ER (DILANTIN) 100 mg ER capsule Take 2 Caps by mouth daily for 30 days.  Qty: 60 Cap, Refills: 0         CONTINUE these medications which have NOT CHANGED    Details   PHENYTOIN SODIUM EXTENDED (DILANTIN PO) Take  by mouth.             Follow-up: Follow-up With Details Comments Contact Info BONEY, ARON R On 07/18/2012 arrive at 12:45 pm for [facility]  [address]  [address]  [place] [address]  [phone]  MALIK, PRAMOD GI, call for appt in 1 week for biopsy results f/u [facility]  [address]  [place] [address]  [phone]        Discharge Condition: stable    Discharge instruction:  Patient instructed to return to the emergency department if: Chest pain, shortness of breath, altered mental status, fever, chills, nausea, vomiting, abdominal pain or any other concerns.    Activity: as tolerated    Diet: 2 g sodium    Wound  Care:none  Labs:  Labs: Results:       Chemistry Recent Labs   [facility] 07/10/12 0458 07/09/12 0510 07/08/12 0430    GLU 109* 86 100*    NA 141 141 137    K 3.6 3.7 4.1    CL 107 107 104    CO2 23 22 23     BUN 2* 1* 2*    CREA 0.56* 0.55* 0.52*    CA 7.2* 7.8* 7.3*    AGAP 11 12 10     BUCR 4* 2* 4*    TBIL -- -- 0.7    GPT -- -- 25    AP -- -- 505*    TP -- -- 4.7*    ALB -- 2.3* 2.0*    GLOB -- -- 2.7    AGRAT -- -- 0.7*      CBC w/Diff Recent Labs   Basename 07/10/12 0458 07/09/12 0510 07/08/12 0430    WBC 5.2 6.4 5.2    RBC 2.80* 3.40* 2.90*    HGB 8.6* 10.5* 8.9*    HCT 25.7* 30.9* 25.9*    PLT 260 251 129*    GRANS 41* 51 51    LYMPH 29 30 27     EOS 2 4 1       Cardiac Enzymes No results found for this basename: CPK:2,CKRMB:2,CKND1:2,TROIP:2,MYO:2 in the last 72 hours   Coagulation No results found for this basename: PTP:2,INR:2,APTT:2 in  the last 72 hours    Lipid Panel No results found for this basename: CHOL, CHOLPOCT, [id number], [id number], [id number], CHOLX, CHOLP, CHLST, CHOLV, [id number], HDL, HDLPOCT, [id number], NHDLCT, [id number], HDLC, HDLP, LDL, LDLPOCT, [id number], NLDLCT, DLDL, LDLC, DLDLP, [id number], VLDLC, VLDL, TGL, TGLX, TRIGL, [id number], TRIGP, TGLPOCT, [id number], [id number], CHHD, CHHDX      BNP No results found for this basename: BNPP in the last 72 hours   Liver Enzymes Recent Labs   [facility] 07/09/12 0510 07/08/12 0430    TP -- 4.7*    ALB 2.3* --    TBIL -- 0.7    AP -- 505*    SGOT -- 56*    GPT -- 25      Thyroid Studies Lab Results   Component Value Date/Time    TSH 0.89 07/04/2012  6:00 AM          Imaging:  CT scan of the head without contrast 07/10/2012.   HISTORY: Subdural hematoma.   TECHNIQUE: Serial axial images were obtained from the foramen magnum to the   skull vertex without IV contrast.   COMPARISON: None.   FINDINGS: Diffuse cerebral and cerebellar atrophy are again noted, out of   proportion to the patient's stated age. Left hemispheric subdural collection is   again noted. The collection is grossly  unchanged in size and configuration.   Minimal local mass effect persists. No definite acute component is identified.   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 The sulci, ventricles, and basal cisterns are, otherwise,   within normal limits for age. There is no acute intracranial hemorrhage,   mass-effect, or midline shift. No new extra-axial fluid collections are   identified. There are no significant additional areas of abnormal parenchymal   attenuation.   The visualized portions of the paranasal sinuses and mastoid air cells are   clear. The visualized bones are intact.   IMPRESSION:   1. Diffuse atrophy which is out of proportion to patient's stated age but   unchanged in comparison to the prior exam.   2. Left frontal subdural hygroma, unchanged.      Right upper quadrant ultrasound   History: Epigastric pain. History of pancreatitis.   Findings: The liver is borderline enlarged measuring 16.1 cm in maximal right   lobe cephalocaudad dimension. The liver is diffusely and moderately echogenic   compatible with steatosis. Patent portal vein with hepatopedal portal venous   flow.   Gallbladder borderline contracted without gallstones or gallbladder wall   thickening.   Pancreas is somewhat heterogeneous and atrophic. Pancreatic tail not well   visualized.   Right kidney measures 12.2 cm in length without hydronephrosis.   Patent IVC.   Impression:   Borderline hepatomegaly with moderate hepatic steatosis. Heterogeneous and   atrophic pancreas may be residua of prior pancreatitis. Cannot exclude mild   acute on chronic pancreatitis. No evidence of gallstones.          Imaging        Consults: id, neuro, GI    Treatment Team: Treatment Team:  Fredrik Cove, DO;; Hospitalist: Fredrik Cove, DO; Consulting Provider: Frutoso Chase, MD; Consulting Provider: Tawnya Crook, MD; Consulting Provider: Elberta Spaniel, MD      Hospital Course:   GI course per GI   Assessment:    1. Seizures secondary to alcohol withdrawal and medication non compliance. Resolved.   2. E. Coli bacteremia - etiology unclear.No ascites on  U/S. No clear etiology seen on EGD and colonoscopy.   3. Anemia Likely secondary to alcohol abuse   4. Hem positive stool EGD showed hiatal hernia and nodular mucosa in the antrum, biopsies pending. Colonoscopy showed small polyps, diverticulosis and dilated rectal veins? Varices.   5. Alcohol-induced liver disease   6. Pancytopenia probably due to chronic alcoholism with possible Cirrhosis.   7. Hx of pancreatic pseudocyst complicating acute ETOH pancreatitis.   8. Hx of chronic subdural hematoma.   Recommendations:   1. Counselled about quitting alcohol   2. Follow up on biopsy results. If adenomatous polyps recommend repeat colonoscopy in 3-5 yrs   3. Out pt follow up with Dr. Danelle Earthly   4. 2 gm sodium diet     ID input:  BSI-E. Coli 3 of 3   With positive GIB suspect GI source   H/o murmur   > 2 D echo neg    switch pt cipro  for discharge to finish a 9 days course   Sepsis Temp down wbc down   Colonoscopy:   9 mm sessile polyp at the hepatic flexure.   2. Sigmoid diverticulosis.   3. Enlarged rectal veins   EGD   Grade C reflux esophagitis r/o Barrett's esophagus.   2. Nodular mucosa at the EG junction.   3. Hiatal hernia.   RUQ [place] neg     Neuro input as follows:  Alcoholic withdrawal seizures: To keep his level at lower therapeutic range. Will keep Dilantin 200 mg qd and recheck his level outpt and adjust meds by pcp    Others:  Alcohol use, : keep onPo thiamine and f.acid   Alcohol liver dx that is contributing to low platelet count, abnormal LFT.   pending HIV, hep panel neg.   Alcohol cessation advised   abdominal US . -Borderline hepatomegaly with moderate hepatic steatosis   Await results of biopsies. If Barrett's w/o dysplasia noted then repeat EGD in 12 months, Daily PPI therapy.   colonscope showed 1. 9 mm sessile polyp at the hepatic flexure,  Sigmoid diverticulosis, Enlarged rectal veins.   f/u results of polyp pathology. Repeat in 3-5 years if either adenomatous or hyperplastic.   Heme:   Thrombocytopenia, resolved coags normal, Multifactorial from alcohol, liver dx, sz meds: Dr. Cyndra Numbers, pending labs by onco   Anemia, mild drop in h and H, to f/u outpt  Loose stools resolving   Stool pos for blood x 1 , neg rpt one   alcohol liver dx, causing Abnormal LFT, low platelet counts, High a.phosphatase, liver stetosis, to f/u with GI now and then outpt with pcp   H/o pancreatitis:   labs lipase,amylase ok   SVT (supraventricular tachycardia: was thought to be sec to abnormal lytes, correction in place, echo wnl   Subdural hematoma,hygroma, chronic , subdural hygroma: Left frontal subdural hygroma, unchanged with rpt CT. F/u outpt     BP:was high, likely related to alcohol WD.currently low bp metoprolol to be d/ced, f/u bp with pcp.    cx-r: nodule: Poorly defined subcentimeter nodule in the right upper lobe ,To f.u outpt      Today, pt is doing much better,walking with PT 120 feet.  Diarrhea improving.  Will d/c to home with [facility] for pt/ot, walker.     Emphased to pt to f/u with pcp, GI for biopsy rpts and other f/u as mentioned above. He expressed understanding    Visit Vitals   Item Reading   ??? BP 115/82   ???  Pulse 88   ??? Temp 97.2 ??F (36.2 ??C)   ??? Resp 18   ??? Ht 5\' 6"  (1.676 m)   ??? Wt 56.7 kg (125 lb)   ??? BMI 20.18 kg/m2   ??? SpO2 100%       Discharge Physical Exam:  General appearance: Alert, cooperative, no distress, appears stated age  Head: Normocephalic, without obvious abnormality, atraumatic  Neck: Supple, no lymphadenopathy or thyromegaly, trachea midline  Lungs: Clear to auscultation bilaterally  Heart: Regular rate and rhythm, S1, S2 normal, no murmur, click, rub or gallop  Abdomen: Soft, non-tender and not-distended. Bowel sounds normal. No masses,  no organomegaly  Extremities: Extremities normal, atraumatic, no cyanosis or edema  Skin: Skin  color, texture, turgor normal. No rashes or lesions  Neurologic: Grossly normal and non focal, normal muscle tone and power, cranial nerves are intact, normal sensation       Galvin Proffer, MD  July 10, 2012      Hospital Medicine  [facility]  Pager: [phone]    Time Spent:

## 2012-07-11 LAB — OVA & PARASITES, STOOL

## 2012-07-11 LAB — OVA & PARASITES, STOOL, REFLEX

## 2012-07-15 LAB — HIV-1 RNA BY BDNA,QT
HIV-1 RNA, QT, BY BDNA, 162165: <75 copies/mL
HIV-1 RNA, QT, by bDNA: <75 copies/mL

## 2012-08-05 NOTE — ED Notes (Signed)
Patient arrives via [facility] for c/o alcohol intoxication and depression since mother passed away 2 weeks ago.  BS 138.

## 2012-08-05 NOTE — ED Provider Notes (Signed)
HPI Comments: The patient is a 51 year old male alcoholic, who states the police picked him up for being intoxciated, and instead of taking him to jail, they brought him here.  He states he has had some life stressors recently, and drank "4 [employer] tonight." He denies any suicidal or homicidal ideations.  No other complaints.     Patient is a 51 y.o. male presenting with intoxication. The history is provided by the patient.   Alcohol intoxication  Primary symptoms include: intoxication.  Pertinent negatives include no fever. Associated medical issues do not include suicidal ideas.        Past Medical History   Diagnosis Date   ??? Pancreatitis chronic    ??? Seizure    ??? Alcoholism    ??? Seizures    ??? Hypertension    ??? GERD (gastroesophageal reflux disease)         Past Surgical History   Procedure Date   ??? Hx gi      EGD   ??? Egd 07/09/2012         ??? Colonoscopy 07/09/2012               Family History   Problem Relation Age of Onset   ??? Hypertension Other         History     Social History   ??? Marital Status: SINGLE     Spouse Name: N/A     Number of Children: N/A   ??? Years of Education: N/A     Occupational History   ??? Not on file.     Social History Main Topics   ??? Smoking status: Not on file   ??? Smokeless tobacco: Not on file   ??? Alcohol Use: Yes   ??? Drug Use:    ??? Sexually Active:      Other Topics Concern   ??? Not on file     Social History Narrative   ??? No narrative on file                  ALLERGIES: Morphine and Shellfish derived      Review of Systems   Constitutional: Negative for fever.   HENT: Negative for facial swelling.    Eyes: Negative for visual disturbance.   Respiratory: Negative for apnea.    Gastrointestinal: Negative for abdominal pain.   Musculoskeletal: Negative for arthralgias.   Neurological: Negative for syncope.   Psychiatric/Behavioral: Negative for suicidal ideas.        As above.        Filed Vitals:    08/06/12 1100 08/06/12 1115 08/06/12 1130 08/06/12 1314   BP: 137/81 134/84 141/80 132/68    Pulse: 96 92 100 87   Temp:       Resp: 23 22 21 17    SpO2: 98% 98% 97% 100%            Physical Exam   Nursing note and vitals reviewed.  Constitutional: He appears well-developed and well-nourished. No distress.        Obviously intoxicated 51 year old male in no acute distress, answering questions appropriately.    HENT:   Head: Normocephalic and atraumatic.   Eyes: EOM are normal. Pupils are equal, round, and reactive to light. No scleral icterus.   Neck: Neck supple.   Cardiovascular: Normal rate, regular rhythm and normal heart sounds.    Pulmonary/Chest: Breath sounds normal. No respiratory distress. He has no wheezes. He has no  rales.   Abdominal: Soft. He exhibits no distension. There is no tenderness. There is no rebound and no guarding.   Musculoskeletal: Normal range of motion.   Neurological: He is alert.   Skin: Skin is warm and dry.   Psychiatric: He has a normal mood and affect. His behavior is normal. Judgment and thought content normal.        MDM     Differential Diagnosis; Clinical Impression; Plan:     51 year old intoxicated male.    Amount and/or Complexity of Data Reviewed:   Clinical lab tests:  Ordered and reviewed  Risk of Significant Complications, Morbidity, and/or Mortality:   Presenting problems:  Low  Diagnostic procedures:  Low  Management options:  Low      Procedures    0600 Assumed care from [facility]. Patient in NAD. Stable. Found intoxicated. Labs reviewed. Needs to be sober before we can release.     Recent Results (from the past 12 hour(s))   ETHYL ALCOHOL    Collection Time    08/06/12  1:38 PM       Component Value Range    ALCOHOL(ETHYL),SERUM 63 (*) 0 - 3 MG/DL         1610 Patient alert oriented x3. No complaints: "I feel fine" He does not have a ride. "I'll walk"     Diagnosis:   1. ETOH abuse          Disposition: discharge home       Patient's Medications   Start Taking    No medications on file   Continue Taking    FOLIC ACID (FOLVITE) 1 MG TABLET    Take 1 Tab by  mouth daily for 30 days.    LORAZEPAM (ATIVAN) 1 MG TABLET    Take 1-2 Tabs by mouth every six (6) hours as needed for 15 doses.    PHENYTOIN ER (DILANTIN) 100 MG ER CAPSULE    Take 2 Caps by mouth daily for 30 days.    THIAMINE (B-1) 100 MG TABLET    Take 1 Tab by mouth daily for 30 days.   These Medications have changed    No medications on file   Stop Taking    No medications on file

## 2012-08-05 NOTE — ED Notes (Signed)
Patient resting quietly in room, respirations even, non-labored.  VSS.  Fluids infused without s/s of infiltration.

## 2012-08-06 LAB — CBC WITH AUTOMATED DIFF
ABS. BASOPHILS: 0 10*3/uL (ref 0.0–0.1)
ABS. EOSINOPHILS: 0 10*3/uL (ref 0.0–0.4)
ABS. LYMPHOCYTES: 1.6 10*3/uL (ref 0.8–3.5)
ABS. MONOCYTES: 1.3 10*3/uL — ABNORMAL HIGH (ref 0–1.0)
ABS. NEUTROPHILS: 6.8 10*3/uL (ref 1.8–8.0)
BASOPHILS: 0 % (ref 0–3)
EOSINOPHILS: 0 % (ref 0–5)
HCT: 30.6 % — ABNORMAL LOW (ref 36.0–48.0)
HGB: 10.2 g/dL — ABNORMAL LOW (ref 13.0–16.0)
LYMPHOCYTES: 16 % — ABNORMAL LOW (ref 20–51)
MCH: 29.4 PG (ref 24.0–34.0)
MCHC: 33.3 g/dL (ref 31.0–37.0)
MCV: 88.2 FL (ref 74.0–97.0)
MONOCYTES: 13 % — ABNORMAL HIGH (ref 2–9)
MPV: 9.1 FL — ABNORMAL LOW (ref 9.2–11.8)
NEUTROPHILS: 71 % (ref 42–75)
PLATELET COMMENTS: ADEQUATE
PLATELET: 184 10*3/uL (ref 135–420)
RBC: 3.47 M/uL — ABNORMAL LOW (ref 4.70–5.50)
RDW: 14.6 % — ABNORMAL HIGH (ref 11.6–14.5)
WBC: 9.7 10*3/uL (ref 4.6–13.2)

## 2012-08-06 LAB — METABOLIC PANEL, COMPREHENSIVE
A-G Ratio: 0.6 — ABNORMAL LOW (ref 0.8–1.7)
ALT (SGPT): 22 U/L (ref 12.0–78.0)
AST (SGOT): 43 U/L — ABNORMAL HIGH (ref 15–37)
Albumin: 2.3 g/dL — ABNORMAL LOW (ref 3.4–5.0)
Alk. phosphatase: 328 U/L — ABNORMAL HIGH (ref 50–136)
Anion gap: 14 mmol/L (ref 3.0–18)
BUN/Creatinine ratio: 6 — ABNORMAL LOW (ref 12–20)
BUN: 4 MG/DL — ABNORMAL LOW (ref 7.0–18)
Bilirubin, total: 0.5 MG/DL (ref 0.2–1.0)
CO2: 27 MMOL/L (ref 21–32)
Calcium: 7.5 MG/DL — ABNORMAL LOW (ref 8.5–10.1)
Chloride: 97 MMOL/L — ABNORMAL LOW (ref 100–108)
Creatinine: 0.7 MG/DL (ref 0.6–1.3)
GFR est AA: >60 mL/min/{1.73_m2} (ref >60)
GFR est non-AA: >60 mL/min/{1.73_m2} (ref >60)
Globulin: 3.6 g/dL (ref 2.0–4.0)
Glucose: 128 MG/DL — ABNORMAL HIGH (ref 74–99)
Potassium: 3.2 MMOL/L — ABNORMAL LOW (ref 3.5–5.5)
Protein, total: 5.9 g/dL — ABNORMAL LOW (ref 6.4–8.2)
Sodium: 138 MMOL/L (ref 136–145)

## 2012-08-06 LAB — MAGNESIUM: Magnesium: 1.7 MG/DL — ABNORMAL LOW (ref 1.8–2.4)

## 2012-08-06 LAB — ETHYL ALCOHOL
ALCOHOL(ETHYL),SERUM: 419 MG/DL — CR (ref 0–3)
ALCOHOL(ETHYL),SERUM: 63 MG/DL — ABNORMAL HIGH (ref 0–3)

## 2012-08-06 MED ADMIN — potassium chloride (K-DUR, KLOR-CON) SR tablet 40 mEq: ORAL | @ 06:00:00 | NDC 68084036011

## 2012-08-06 MED ADMIN — ondansetron (ZOFRAN) injection 4 mg: INTRAVENOUS | @ 06:00:00 | NDC 00409475503

## 2012-08-06 MED ADMIN — sodium chloride 0.9 % bolus infusion 1,000 mL: INTRAVENOUS | @ 01:00:00 | NDC 00409798309

## 2012-08-06 MED ADMIN — magnesium sulfate 2 g/50 ml IVPB (premix or compounded): INTRAVENOUS | @ 06:00:00 | NDC 00409672924

## 2012-08-06 MED ADMIN — therapeutic multivitamin (THERAGRAN) tablet 1 Tab: ORAL | @ 06:00:00

## 2012-08-06 MED FILL — THERA 400 MCG TABLET: 400 mcg | ORAL | Qty: 1

## 2012-08-06 MED FILL — MAGNESIUM SULFATE 2 GRAM/50 ML IVPB: 2 gram/50 mL (4 %) | INTRAVENOUS | Qty: 50

## 2012-08-06 MED FILL — KLOR-CON M20 MEQ TABLET,EXTENDED RELEASE: 20 mEq | ORAL | Qty: 2

## 2012-08-06 MED FILL — ONDANSETRON (PF) 4 MG/2 ML INJECTION: 4 mg/2 mL | INTRAMUSCULAR | Qty: 2

## 2012-08-06 MED FILL — SODIUM CHLORIDE 0.9 % IV: INTRAVENOUS | Qty: 1000

## 2012-08-06 NOTE — ED Notes (Signed)
No change in assessment

## 2012-08-06 NOTE — ED Notes (Signed)
Pt awake, alert, and smiling. Pt ate meal tray w/o difficulty.

## 2012-08-06 NOTE — ED Notes (Signed)
Care assumed.

## 2012-08-06 NOTE — ED Provider Notes (Signed)
I was personally available for consultation in the emergency department. I have reviewed the chart prior to the patient's discharge and agree with the documentation recorded by the MLP, including the assessment, treatment plan, and disposition.

## 2012-08-06 NOTE — ED Notes (Signed)
Patient armband removed and given to patient to take home.  Patient was informed of the privacy risks if armband lost or stolen.  I have reviewed discharge instructions with the patient.  The patient verbalized understanding.  Name and DOB verified on DC instructions.

## 2012-08-06 NOTE — ED Notes (Signed)
Patient medicated as charted.  Incontinent of urine.  Pericare performed, sheets changed.  Patient currently awake, alert, VSS since arrival.  Continuing to monitor patient.

## 2012-08-06 NOTE — ED Notes (Signed)
Meal tray given.

## 2012-08-06 NOTE — ED Notes (Signed)
Pt awake and alert, ice water given.

## 2012-08-06 NOTE — ED Notes (Signed)
Pt awake and alert and oriented, denies any friends that can pick him up. Pt states he is feeling better and ready to go home

## 2012-08-06 NOTE — ED Notes (Signed)
Pt educated about getting BP rechecked and quitting etoh. Pt amb with steady gait with bag of belongings to BR to change.

## 2012-08-19 LAB — CBC WITH AUTOMATED DIFF
ABS. BASOPHILS: 0.1 10*3/uL — ABNORMAL HIGH (ref 0.0–0.06)
ABS. EOSINOPHILS: 0 10*3/uL (ref 0.0–0.4)
ABS. LYMPHOCYTES: 2.5 10*3/uL (ref 0.9–3.6)
ABS. MONOCYTES: 0.5 10*3/uL (ref 0.05–1.2)
ABS. NEUTROPHILS: 3.6 10*3/uL (ref 1.8–8.0)
BASOPHILS: 1 % (ref 0–2)
EOSINOPHILS: 0 % (ref 0–5)
HCT: 32.8 % — ABNORMAL LOW (ref 36.0–48.0)
HGB: 10.7 g/dL — ABNORMAL LOW (ref 13.0–16.0)
LYMPHOCYTES: 38 % (ref 21–52)
MCH: 29.1 PG (ref 24.0–34.0)
MCHC: 32.6 g/dL (ref 31.0–37.0)
MCV: 89.1 FL (ref 74.0–97.0)
MONOCYTES: 8 % (ref 3–10)
MPV: 8.6 FL — ABNORMAL LOW (ref 9.2–11.8)
NEUTROPHILS: 53 % (ref 40–73)
PLATELET: 288 10*3/uL (ref 135–420)
RBC: 3.68 M/uL — ABNORMAL LOW (ref 4.70–5.50)
RDW: 14.2 % (ref 11.6–14.5)
WBC: 6.7 10*3/uL (ref 4.6–13.2)

## 2012-08-19 LAB — METABOLIC PANEL, BASIC
Anion gap: 14 mmol/L (ref 3.0–18)
BUN/Creatinine ratio: 8 — ABNORMAL LOW (ref 12–20)
BUN: 4 MG/DL — ABNORMAL LOW (ref 7.0–18)
CO2: 24 MMOL/L (ref 21–32)
Calcium: 7.7 MG/DL — ABNORMAL LOW (ref 8.5–10.1)
Chloride: 105 MMOL/L (ref 100–108)
Creatinine: 0.53 MG/DL — ABNORMAL LOW (ref 0.6–1.3)
GFR est AA: >60 mL/min/{1.73_m2} (ref >60)
GFR est non-AA: >60 mL/min/{1.73_m2} (ref >60)
Glucose: 81 MG/DL (ref 74–99)
Potassium: 3.8 MMOL/L (ref 3.5–5.5)
Sodium: 143 MMOL/L (ref 136–145)

## 2012-08-19 LAB — ETHYL ALCOHOL: ALCOHOL(ETHYL),SERUM: 480 MG/DL — CR (ref 0–3)

## 2012-08-19 MED ADMIN — 0.9% sodium chloride infusion 1,000 mL: INTRAVENOUS | @ 18:00:00 | NDC 00338004904

## 2012-08-19 MED FILL — SODIUM CHLORIDE 0.9 % IV: INTRAVENOUS | Qty: 1000

## 2012-08-19 NOTE — ED Notes (Signed)
Report given to Dina RN.
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
## 2012-08-20 LAB — ETHYL ALCOHOL
ALCOHOL(ETHYL),SERUM: 275 MG/DL — ABNORMAL HIGH (ref 0–3)
ALCOHOL(ETHYL),SERUM: 30 MG/DL — ABNORMAL HIGH (ref 0–3)

## 2012-08-20 NOTE — ED Notes (Signed)
Pt given 5th cup of ice water and urinal emptied for 900cc.

## 2012-08-20 NOTE — ED Notes (Signed)
Patient armband removed and shredded. I have reviewed discharge instructions with the patient.  The patient verbalized understanding.

## 2012-08-20 NOTE — ED Provider Notes (Signed)
I was personally available for consultation in the emergency department. I have reviewed the chart prior to the patient's discharge and agree with the documentation recorded by the MLP, including the assessment, treatment plan, and disposition.

## 2012-08-20 NOTE — ED Notes (Signed)
Pt signed discharge paperwork to be scanned into chart.

## 2013-05-06 DIAGNOSIS — R4182 Altered mental status, unspecified: Secondary | ICD-10-CM

## 2013-05-06 HISTORY — DX: Altered mental status, unspecified: R41.82

## 2013-10-17 MED ADMIN — phenytoin (DILANTIN) oral suspension 200 mg: ORAL | @ 06:00:00 | NDC 17856406904

## 2013-10-17 NOTE — ED Notes (Signed)
Patient states he has been in jail for the last 90 days and he needs his Dilantin RX refilled.  Patient states he is also having trouble swallowing, he states this has been bothering him even before he went to jail.  Patient states he has to over chew his food and drink water to be able to swollow.  Patient states this has been a problem since he had a procedure for pancreatitis

## 2013-10-17 NOTE — ED Provider Notes (Signed)
HPI Comments: 52 yo M with hx of pancreatitis, seizures, alcoholism, HTN, and GERD, presents to the ED c/o dysphagia since last night.  Pt states that he was just released from jail 2 nights ago and has not had proper medical care.  Pt states that he was followed by Dr. Danelle Earthly, GI, in the past and needs to get back to see him.  Pt states that he is also on dilantin and needs his prescription filled.  Pt smokes occasionally and last drank ETOH yesterday evening.  Pt denies fever, chills, N/V/D, CP, SOB, ABD pain, and any other sx or complaints.       Past Medical History   Diagnosis Date   ??? Pancreatitis chronic    ??? Seizure    ??? Alcoholism    ??? Seizures    ??? Hypertension    ??? GERD (gastroesophageal reflux disease)         Past Surgical History   Procedure Laterality Date   ??? Hx gi       EGD   ??? Egd  07/09/2012         ??? Colonoscopy  07/09/2012               Family History   Problem Relation Age of Onset   ??? Hypertension Other         History     Social History   ??? Marital Status: SINGLE     Spouse Name: N/A     Number of Children: N/A   ??? Years of Education: N/A     Occupational History   ??? Not on file.     Social History Main Topics   ??? Smoking status: Current Every Day Smoker   ??? Smokeless tobacco: Not on file   ??? Alcohol Use: Yes   ??? Drug Use: Not on file   ??? Sexually Active: Not on file     Other Topics Concern   ??? Not on file     Social History Narrative   ??? No narrative on file                  ALLERGIES: Morphine and Shellfish derived      Review of Systems   Constitutional: Negative.  Negative for fever, chills and diaphoresis.   HENT: Positive for trouble swallowing. Negative for ear pain, congestion, sore throat and neck pain.    Eyes: Negative.  Negative for photophobia, pain, redness and visual disturbance.   Respiratory: Negative.  Negative for cough, chest tightness, shortness of breath and wheezing.    Cardiovascular: Negative.  Negative for chest pain and palpitations.   Gastrointestinal: Negative.   Negative for nausea, vomiting, abdominal pain, diarrhea and blood in stool.   Genitourinary: Negative for dysuria and frequency.   Musculoskeletal: Negative.  Negative for back pain and joint swelling.   Skin: Negative.    Neurological: Negative.  Negative for seizures, syncope and headaches.   Psychiatric/Behavioral: Negative.  Negative for behavioral problems and confusion. The patient is not nervous/anxious.    All other systems reviewed and are negative.        Filed Vitals:    10/17/13 0034   BP: 120/83   Pulse: 105   Temp: 97.7 ??F (36.5 ??C)   Resp: 16   Height: 5\' 6"  (1.676 m)   Weight: 61.236 kg (135 lb)   SpO2: 98%            Physical Exam   Nursing note  and vitals reviewed.  Constitutional: He is oriented to person, place, and time. He appears well-developed and well-nourished. No distress.   HENT:   Head: Normocephalic and atraumatic.   Mouth/Throat: Oropharynx is clear and moist.   Able to swallow liquids in the ED.   Eyes: Conjunctivae and EOM are normal. Pupils are equal, round, and reactive to light. No scleral icterus.   Neck: Normal range of motion. Neck supple.   Cardiovascular: Normal rate, regular rhythm and normal heart sounds.    No murmur heard.  Pulmonary/Chest: Effort normal and breath sounds normal. No respiratory distress.   Abdominal: Soft. Bowel sounds are normal. He exhibits no distension. There is no tenderness.   Musculoskeletal: He exhibits no edema.   Lymphadenopathy:     He has no cervical adenopathy.   Neurological: He is alert and oriented to person, place, and time. Coordination normal.   Skin: Skin is warm and dry. No rash noted.   Psychiatric: He has a normal mood and affect. His behavior is normal.        MDM     Differential Diagnosis; Clinical Impression; Plan:     Here for medication refill of dilantin and referral to GI states has had chronic dysphagia with symptoms slowly progressive over months  Able to swallow water in ED       Procedures     -------------------------------------------------------------------------------------------------------------------     EKG INTERPRETATIONS:  None      LAB RESULTS:   No results found for this or any previous visit (from the past 8 hour(s)).    RADIOLOGY RESULTS:   None    ORDERS:  Orders Placed This Encounter   ??? phenytoin ER (DILANTIN) 100 mg ER capsule     Sig: 100 mg.   ??? phenytoin (DILANTIN) oral suspension 200 mg     Sig:    ??? phenytoin (DILANTIN INFATABS) 50 mg tablet     Sig: Take 4 tablets by mouth daily.     Dispense:  60 tablet     Refill:  0       CONSULTATIONS:  None    PROGRESS NOTES:  12:47 AM:  Dr. Burtis Junes, MD answered the patient's questions regarding treatment. Pt is able to swallow liquids in the ED.  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 Danelle Earthly.  1:37 AM:  Dr. Burtis Junes, MD reviewed medications with the patient. The patient is feeling better. Dr. Burtis Junes, MD answered the patient's questions. The patient is ready to be discharged home.            ED DIAGNOSIS AND DISPOSITION:  Diagnosis:   1. Dysphagia    2. Seizure disorder          Disposition: Home    Follow-up Information    Follow up With Details Comments Contact Info    [facility] EMERGENCY DEPT  As needed if symptoms worsen [address]  [place] [address]  [phone]    Zettie Cooley, MD Schedule an appointment as soon as possible for a visit  [address]  Gastroenterology [facility] [address]  [phone]      Jenene Slicker, MD Schedule an appointment as soon as possible for a visit  [address]  [facility]  [address]  [place] [address]  [phone]            Current Discharge Medication List  START taking these medications    Details   phenytoin (DILANTIN INFATABS) 50 mg tablet Take 4 tablets by mouth daily.  Qty: 60 tablet, Refills: 0         CONTINUE these medications which have NOT CHANGED    Details   phenytoin ER (DILANTIN) 100 mg ER  capsule 100 mg.      LORazepam (ATIVAN) 1 mg tablet Take 1-2 Tabs by mouth every six (6) hours as needed for 15 doses.  Qty: 15 Tab, Refills: 0             Scribe Attestation:  written ZO:XWRUEAV Horton, (1:03 AM) scribing for and in the presence of Dr.Kaleel Schmieder Johnette Abraham, MD  ED Provider (1:03 AM).    Provider Attestation:   I personally performed the services described in the documentation, reviewed the documentation, as recorded by the scribe in my presence, and it accurately and completely records my words and actions.   Dr. Burtis Junes, MD ED Provider     -------------------------------------------------------------------------------------------------------------------

## 2013-10-17 NOTE — ED Notes (Signed)
I have reviewed discharge instructions with the patient.  The patient verbalized understanding. Patient in stable condition at discharge. Patient armband removed and given to patient to take home.  Patient was informed of the privacy risks if armband lost or stolen

## 2013-10-21 LAB — CBC WITH AUTOMATED DIFF
ABS. BASOPHILS: 0 10*3/uL (ref 0.0–0.06)
ABS. EOSINOPHILS: 0.4 10*3/uL (ref 0.0–0.4)
ABS. LYMPHOCYTES: 3.5 10*3/uL (ref 0.9–3.6)
ABS. MONOCYTES: 0.4 10*3/uL (ref 0.05–1.2)
ABS. NEUTROPHILS: 2.3 10*3/uL (ref 1.8–8.0)
BASOPHILS: 1 % (ref 0–2)
EOSINOPHILS: 6 % — ABNORMAL HIGH (ref 0–5)
HCT: 35.1 % — ABNORMAL LOW (ref 36.0–48.0)
HGB: 12.3 g/dL — ABNORMAL LOW (ref 13.0–16.0)
LYMPHOCYTES: 51 % (ref 21–52)
MCH: 30.9 PG (ref 24.0–34.0)
MCHC: 35 g/dL (ref 31.0–37.0)
MCV: 88.2 FL (ref 74.0–97.0)
MONOCYTES: 7 % (ref 3–10)
MPV: 8.7 FL — ABNORMAL LOW (ref 9.2–11.8)
NEUTROPHILS: 35 % — ABNORMAL LOW (ref 40–73)
PLATELET: 297 10*3/uL (ref 135–420)
RBC: 3.98 M/uL — ABNORMAL LOW (ref 4.70–5.50)
RDW: 13.4 % (ref 11.6–14.5)
WBC: 6.6 10*3/uL (ref 4.6–13.2)

## 2013-10-21 LAB — URINALYSIS W/ RFLX MICROSCOPIC
Bilirubin: NEGATIVE
Glucose: NEGATIVE mg/dL
Ketone: NEGATIVE mg/dL
Leukocyte Esterase: NEGATIVE
Nitrites: NEGATIVE
Protein: NEGATIVE mg/dL
Specific gravity: <1.005 (ref 1.003–1.030)
Urobilinogen: 0.2 EU/dL (ref 0.2–1.0)
pH (UA): 5.5 (ref 5.0–8.0)

## 2013-10-21 LAB — METABOLIC PANEL, COMPREHENSIVE
A-G Ratio: 1.3 (ref 0.8–1.7)
ALT (SGPT): 34 U/L (ref 16–61)
AST (SGOT): 48 U/L — ABNORMAL HIGH (ref 15–37)
Albumin: 3.9 g/dL (ref 3.4–5.0)
Alk. phosphatase: 149 U/L — ABNORMAL HIGH (ref 45–117)
Anion gap: 9 mmol/L (ref 3.0–18)
BUN/Creatinine ratio: 11 — ABNORMAL LOW (ref 12–20)
BUN: 6 MG/DL — ABNORMAL LOW (ref 7.0–18)
Bilirubin, total: 0.3 MG/DL (ref 0.2–1.0)
CO2: 27 mmol/L (ref 21–32)
Calcium: 8.3 MG/DL — ABNORMAL LOW (ref 8.5–10.1)
Chloride: 108 mmol/L (ref 100–108)
Creatinine: 0.57 MG/DL — ABNORMAL LOW (ref 0.6–1.3)
GFR est AA: >60 mL/min/{1.73_m2} (ref >60)
GFR est non-AA: >60 mL/min/{1.73_m2} (ref >60)
Globulin: 3.1 g/dL (ref 2.0–4.0)
Glucose: 106 mg/dL — ABNORMAL HIGH (ref 74–99)
Potassium: 3.8 mmol/L (ref 3.5–5.5)
Protein, total: 7 g/dL (ref 6.4–8.2)
Sodium: 144 mmol/L (ref 136–145)

## 2013-10-21 LAB — DRUG SCREEN, URINE
AMPHETAMINES: NEGATIVE
BARBITURATES: NEGATIVE
BENZODIAZEPINES: NEGATIVE
COCAINE: NEGATIVE
METHADONE: NEGATIVE
OPIATES: NEGATIVE
PCP(PHENCYCLIDINE): NEGATIVE
THC (TH-CANNABINOL): NEGATIVE

## 2013-10-21 LAB — MAGNESIUM: Magnesium: 2.2 mg/dL (ref 1.8–2.4)

## 2013-10-21 LAB — URINE MICROSCOPIC ONLY
Bacteria: NEGATIVE /hpf
Epithelial cells: 0 /lpf (ref 0–5)
RBC: 0 /hpf (ref 0–5)
WBC: 0 /hpf (ref 0–4)

## 2013-10-21 LAB — CARDIAC PANEL,(CK, CKMB & TROPONIN)
CK - MB: 4.7 ng/ml — ABNORMAL HIGH (ref 0.5–3.6)
CK-MB Index: 1.7 % (ref 0.0–4.0)
CK: 278 U/L (ref 39–308)
Troponin-I, QT: <0.02 NG/ML (ref 0.0–0.045)

## 2013-10-21 LAB — LIPASE: Lipase: 63 U/L — ABNORMAL LOW (ref 73–393)

## 2013-10-21 LAB — PHENYTOIN: Phenytoin: 1.4 ug/mL — ABNORMAL LOW (ref 10.0–20.0)

## 2013-10-21 LAB — ETHYL ALCOHOL: ALCOHOL(ETHYL),SERUM: 425 MG/DL — CR (ref 0–3)

## 2013-10-21 MED ADMIN — 0.9% sodium chloride 1,000 mL with mvi, adult no.4 with vit K 3,300 unit- 150 mcg/10 mL 10 mL, thiamine 100 mg, folic acid 1 mg infusion: INTRAVENOUS

## 2013-10-21 MED ADMIN — sodium chloride 0.9 % bolus infusion 1,000 mL: INTRAVENOUS | NDC 00409798309

## 2013-10-21 MED ADMIN — 0.9% sodium chloride 1,000 mL with mvi, adult no.4 with vit K 3,300 unit- 150 mcg/10 mL 10 mL, thiamine 100 mg, folic acid 1 mg infusion: INTRAVENOUS | @ 22:00:00 | NDC 99999323202

## 2013-10-21 NOTE — ED Notes (Signed)
Remove C-collar per Dr. Elonda Husky

## 2013-10-21 NOTE — ED Notes (Signed)
+   for ETOH.

## 2013-10-21 NOTE — ED Notes (Signed)
Patient sleeping at this time no distress noted.

## 2013-10-21 NOTE — ED Provider Notes (Addendum)
HPI Comments: Jeremy Walters is a  52 y.o. Normal build white male smoker h/o Dysphagia (known to GI Dr. Danelle Earthly), Alcoholism, Chronic Pancreatitis w/ associated Pseudocyst, Alcohol Withdrawal/DT, Alcoholic Liver Dz, GERD, HTN, SVT, Thrombocytopenia/Anemia, Chronic Subdural Hematoma, Medical Non-Compliance presents to the ED via EMS after being found on the ground behind the bus stop, reporting "I drank too much, had a seizure."     PSX:   1. EGD showed hiatal hernia and nodular mucosa in the antrum, biopsies pending. Colonoscopy showed small polyps, diverticulosis and dilated rectal veins? Varices.  (07/2012)             Patient is a 52 y.o. male presenting with intoxication, neck pain, and dizziness. The history is provided by the patient.   Alcohol intoxication  Primary symptoms include: confusion, seizures and intoxication.  Pertinent negatives include no fever, no nausea and no vomiting.   Neck Pain   Associated symptoms include headaches. Pertinent negatives include no chest pain.   Dizziness  Associated symptoms include confusion and headaches. Pertinent negatives include no shortness of breath, no chest pain, no vomiting and no nausea.   pt with h/o pancreatic cancer and ETOH abuse was found on the ground behind the bus stop.  Pt says he drank too much, had a seizure.  Is concerned about calling his father to let him know he may not be in the [employer] show on the [place] tomorrow evening at [facility].    Denies CP SOB, abd pain, n/v/d.    Smokes cigarettes.  Denies illicits.     Past Medical History   Diagnosis Date   ??? Pancreatitis chronic    ??? Seizure    ??? Alcoholism    ??? Seizures    ??? Hypertension    ??? GERD (gastroesophageal reflux disease)         Past Surgical History   Procedure Laterality Date   ??? Hx gi       EGD   ??? Egd  07/09/2012         ??? Colonoscopy  07/09/2012               Family History   Problem Relation Age of Onset   ??? Hypertension Other         History     Social History   ??? Marital  Status: SINGLE     Spouse Name: N/A     Number of Children: N/A   ??? Years of Education: N/A     Occupational History   ??? Not on file.     Social History Main Topics   ??? Smoking status: Current Every Day Smoker   ??? Smokeless tobacco: Not on file   ??? Alcohol Use: Yes   ??? Drug Use: Not on file   ??? Sexually Active: Not on file     Other Topics Concern   ??? Not on file     Social History Narrative   ??? No narrative on file                  ALLERGIES: Morphine and Shellfish derived      Review of Systems   Constitutional: Negative for fever and chills.   HENT: Positive for neck pain. Negative for ear pain, sore throat and neck stiffness.    Eyes: Negative.  Negative for pain and discharge.   Respiratory: Negative for cough and shortness of breath.    Cardiovascular: Negative for chest pain and palpitations.  Gastrointestinal: Negative for nausea, vomiting, abdominal pain, diarrhea, constipation and blood in stool.   Genitourinary: Negative for dysuria, urgency and frequency.   Musculoskeletal: Negative for joint swelling and arthralgias.   Skin: Negative for color change, pallor, rash and wound.   Neurological: Positive for dizziness, seizures and headaches.        ?seizure   Psychiatric/Behavioral: Positive for behavioral problems and confusion.       Filed Vitals:    10/21/13 2115 10/21/13 2130 10/21/13 2200 10/21/13 2245   BP:  115/67 117/82    Pulse: 88 89 85    Temp:       Resp: 15 15 13     Weight:    57.7 kg (127 lb 3.3 oz)   SpO2: 93% 87% 100%             Physical Exam   Nursing note and vitals reviewed.  Constitutional: Vital signs are normal. He appears well-developed and well-nourished. He is active.  Non-toxic appearance. He does not appear ill. No distress.   HENT:   Head: Normocephalic and atraumatic.   Eyes: EOM are normal. No scleral icterus.   Neck: Normal range of motion. Neck supple. Carotid bruit is not present. No tracheal deviation present. No thyromegaly present.   Cardiovascular: Normal rate, regular  rhythm, normal heart sounds and intact distal pulses.  Exam reveals no gallop and no friction rub.    No murmur heard.  Pulmonary/Chest: Effort normal and breath sounds normal. No stridor. No respiratory distress. He has no wheezes. He has no rales. He exhibits no tenderness.   Abdominal: Soft. He exhibits no distension and no mass. There is no tenderness. There is no rebound, no guarding and no CVA tenderness.   Musculoskeletal: Normal range of motion.   Neurological: He is alert. He has normal strength. No sensory deficit.   MS 5/5 throughout.    Skin: Skin is warm, dry and intact. He is not diaphoretic. No pallor.   Psychiatric: His speech is normal and behavior is normal. Judgment normal.   Slurred speech; ETOH odor of breath        MDM     Differential Diagnosis; Clinical Impression; Plan:     Differential: ETOH abuse; pancreatitis; seizures; fracture; ICH; dehydration; arrhythmia    Will wait for sobriety to evaluate if pt really takes Dilantin regularly or only if when is undergoing ETOH withdrawal.    Discussed with PA Waldo Laine concerning patient Jeremy Walters, standard discussion of reason for visit, HPI, ROS, PE, and current results available.  Recommendation for sobriety and then reassess. I agree to assume the care of this patient at this time as the previous provider's shift has come to a close. I have introduced myself to the patient, explained that I was the provider who would be following the remaining course of their stay in the Emergency Department. I subsequently reaffirmed the history by the preceding provider, I have interviewed, and reexamined the patient.     Michaelyn Barter, PA  October 21, 2013  6:00 PM    10 PM: Patient alert, interacts appropriately albeit confused at times. ETOH level 2129 was 298. Will continue to monitor.     0200 AM: Patient getting restless. Mildly tachycardic. Ativan 2 mg IM ordered per Dr. Langston Masker. Patient pulled IV out. Ammonia level ordered.     Discussed with  Dr. Luisa Dago (EP) concerning patient Jeremy Walters, standard discussion of reason for visit, HPI, ROS, PE, and current results available.  Recommendation for awaiting sobriety. He agrees to assume the care of this patient at this time as the previous provider's shift has come to a close.   Michaelyn Barter, PA  October 22, 2013  3:00 AM                 Amount and/or Complexity of Data Reviewed:   Clinical lab tests:  Ordered and reviewed  Tests in the radiology section of CPT??:  Ordered and reviewed  Tests in the medicine section of the CPT??:  Ordered and reviewed  Discussion of test results with the performing providers:  Yes   Decide to obtain previous medical records or to obtain history from someone other than the patient:  Yes   Obtain history from someone other than the patient:  No   Review and summarize past medical records:  Yes   Discuss the patient with another provider:  Yes   Independant visualization of image, tracing, or specimen:  Yes  Risk of Significant Complications, Morbidity, and/or Mortality:   Presenting problems:  Moderate  Diagnostic procedures:  Moderate  Management options:  Moderate  Progress:   Patient progress:  Stable      Procedures    6:19 PM  Care turned over to oncoming PA Mashal Slavick    Diagnosis:   1. Alcohol abuse with intoxication          Disposition: NOT YET DETERMINED.     Follow-up Information    Follow up With Details Comments Contact Info    [facility] EMERGENCY DEPT  As needed if symptoms worsen [address]  [place] [address]  [phone]          Patient's Medications   Start Taking    No medications on file   Continue Taking    LORAZEPAM (ATIVAN) 1 MG TABLET    Take 1-2 Tabs by mouth every six (6) hours as needed for 15 doses.    PHENYTOIN (DILANTIN INFATABS) 50 MG TABLET    Take 4 tablets by mouth daily.    PHENYTOIN ER (DILANTIN) 100 MG ER CAPSULE    100 mg.   These Medications have changed    No medications on file   Stop Taking    No medications on file       Recent  Results (from the past 24 hour(s))   EKG, 12 LEAD, INITIAL    Collection Time     10/21/13  1:23 PM       Result Value Range    Ventricular Rate 117      Atrial Rate 117      P-R Interval 150      QRS Duration 84      Q-T Interval 324      QTC Calculation (Bezet) 451      Calculated P Axis 70      Calculated R Axis 57      Calculated T Axis 77      Diagnosis        Value: Sinus tachycardia      Possible Left atrial enlargement      Borderline ECG      When compared with ECG of 04-Jul-2012 04:24,      Non-specific change in ST segment in Anterior leads      Nonspecific T wave abnormality no longer evident in Anterolateral leads   DRUG SCREEN, URINE    Collection Time     10/21/13  1:45 PM  Result Value Range    BENZODIAZEPINE NEGATIVE   NEG      BARBITURATES NEGATIVE   NEG      THC (TH-CANNABINOL) NEGATIVE   NEG      OPIATES NEGATIVE   NEG      PCP(PHENCYCLIDINE) NEGATIVE   NEG      COCAINE NEGATIVE   NEG      AMPHETAMINE NEGATIVE   NEG      METHADONE NEGATIVE       HDSCOM (NOTE)     URINALYSIS W/ RFLX MICROSCOPIC    Collection Time     10/21/13  1:45 PM       Result Value Range    Color YELLOW      Appearance CLEAR      Specific gravity <1.005  1.003 - 1.030    pH (UA) 5.5  5.0 - 8.0      Protein NEGATIVE   NEG mg/dL    Glucose NEGATIVE   NEG mg/dL    Ketone NEGATIVE   NEG mg/dL    Bilirubin NEGATIVE   NEG      Blood TRACE (*) NEG      Urobilinogen 0.2  0.2 - 1.0 EU/dL    Nitrites NEGATIVE   NEG      Leukocyte Esterase NEGATIVE   NEG     URINE MICROSCOPIC ONLY    Collection Time     10/21/13  1:45 PM       Result Value Range    WBC 0  0 - 4 /hpf    RBC 0  0 - 5 /hpf    Epithelial cells 0  0 - 5 /lpf    Bacteria NEGATIVE   NEG /hpf   ETHYL ALCOHOL    Collection Time     10/21/13  2:46 PM       Result Value Range    ALCOHOL(ETHYL),SERUM 425 (*) 0 - 3 MG/DL   MAGNESIUM    Collection Time     10/21/13  2:46 PM       Result Value Range    Magnesium 2.2  1.8 - 2.4 mg/dL   CBC WITH AUTOMATED DIFF    Collection Time      10/21/13  2:46 PM       Result Value Range    WBC 6.6  4.6 - 13.2 K/uL    RBC 3.98 (*) 4.70 - 5.50 M/uL    HGB 12.3 (*) 13.0 - 16.0 g/dL    HCT 16.1 (*) 09.6 - 48.0 %    MCV 88.2  74.0 - 97.0 FL    MCH 30.9  24.0 - 34.0 PG    MCHC 35.0  31.0 - 37.0 g/dL    RDW 04.5  40.9 - 81.1 %    PLATELET 297  135 - 420 K/uL    MPV 8.7 (*) 9.2 - 11.8 FL    NEUTROPHILS 35 (*) 40 - 73 %    LYMPHOCYTES 51  21 - 52 %    MONOCYTES 7  3 - 10 %    EOSINOPHILS 6 (*) 0 - 5 %    BASOPHILS 1  0 - 2 %    ABS. NEUTROPHILS 2.3  1.8 - 8.0 K/UL    ABS. LYMPHOCYTES 3.5  0.9 - 3.6 K/UL    ABS. MONOCYTES 0.4  0.05 - 1.2 K/UL    ABS. EOSINOPHILS 0.4  0.0 - 0.4 K/UL    ABS. BASOPHILS 0.0  0.0 -  0.06 K/UL    DF AUTOMATED     METABOLIC PANEL, COMPREHENSIVE    Collection Time     10/21/13  2:46 PM       Result Value Range    Sodium 144  136 - 145 mmol/L    Potassium 3.8  3.5 - 5.5 mmol/L    Chloride 108  100 - 108 mmol/L    CO2 27  21 - 32 mmol/L    Anion gap 9  3.0 - 18 mmol/L    Glucose 106 (*) 74 - 99 mg/dL    BUN 6 (*) 7.0 - 18 MG/DL    Creatinine 1.61 (*) 0.6 - 1.3 MG/DL    BUN/Creatinine ratio 11 (*) 12 - 20      GFR est AA >60  >60 ml/min/1.71m2    GFR est non-AA >60  >60 ml/min/1.29m2    Calcium 8.3 (*) 8.5 - 10.1 MG/DL    Bilirubin, total 0.3  0.2 - 1.0 MG/DL    ALT 34  16 - 61 U/L    AST 48 (*) 15 - 37 U/L    Alk. phosphatase 149 (*) 45 - 117 U/L    Protein, total 7.0  6.4 - 8.2 g/dL    Albumin 3.9  3.4 - 5.0 g/dL    Globulin 3.1  2.0 - 4.0 g/dL    A-G Ratio 1.3  0.8 - 1.7     LIPASE    Collection Time     10/21/13  2:46 PM       Result Value Range    Lipase 63 (*) 73 - 393 U/L   CARDIAC PANEL,(CK, CKMB & TROPONIN)    Collection Time     10/21/13  2:46 PM       Result Value Range    CK 278  39 - 308 U/L    CK - MB 4.7 (*) 0.5 - 3.6 ng/ml    CK-MB Index 1.7  0.0 - 4.0 %    Troponin-I, Qt. <0.02  0.0 - 0.045 NG/ML   PHENYTOIN    Collection Time     10/21/13  2:46 PM       Result Value Range    Phenytoin 1.4 (*) 10.0 - 20.0 ug/mL   PROTHROMBIN  TIME + INR    Collection Time     10/21/13  2:46 PM       Result Value Range    Prothrombin time 12.1  11.5 - 15.2 sec    INR 0.9  0.8 - 1.2     PTT    Collection Time     10/21/13  2:46 PM       Result Value Range    aPTT 29.4  24.6 - 37.7 SEC   ETHYL ALCOHOL    Collection Time     10/21/13  8:50 PM       Result Value Range    ALCOHOL(ETHYL),SERUM 298 (*) 0 - 3 MG/DL   AMMONIA    Collection Time     10/22/13  1:50 AM       Result Value Range    Ammonia 20  11 - 32 UMOL/L

## 2013-10-21 NOTE — ED Provider Notes (Signed)
I was personally available for consultation in the emergency department. I have reviewed the chart prior to the patient's discharge and agree with the documentation recorded by the MLP, including the assessment, treatment plan, and disposition.

## 2013-10-22 LAB — EKG, 12 LEAD, INITIAL
Atrial Rate: 117 {beats}/min
Calculated P Axis: 70 degrees
Calculated R Axis: 57 degrees
Calculated T Axis: 77 degrees
P-R Interval: 150 ms
Q-T Interval: 324 ms
QRS Duration: 84 ms
QTC Calculation (Bezet): 451 ms
Ventricular Rate: 117 {beats}/min

## 2013-10-22 LAB — ETHYL ALCOHOL
ALCOHOL(ETHYL),SERUM: 298 MG/DL — ABNORMAL HIGH (ref 0–3)
ALCOHOL(ETHYL),SERUM: 133 MG/DL — ABNORMAL HIGH (ref 0–3)
ALCOHOL(ETHYL),SERUM: 304 MG/DL — ABNORMAL HIGH (ref 0–3)

## 2013-10-22 LAB — PTT: aPTT: 29.4 s (ref 24.6–37.7)

## 2013-10-22 LAB — AMMONIA: Ammonia, plasma: 20 umol/L (ref 11–32)

## 2013-10-22 LAB — PROTHROMBIN TIME + INR
INR: 0.9 (ref 0.8–1.2)
Prothrombin time: 12.1 s (ref 11.5–15.2)

## 2013-10-22 MED ORDER — DIAZEPAM 5 MG TAB
5 mg | ORAL | Status: DC
Start: 2013-10-22 — End: 2013-10-22

## 2013-10-22 MED ORDER — PHENYTOIN SODIUM 50 MG/ML IV
50 mg/mL | INTRAVENOUS | Status: DC
Start: 2013-10-22 — End: 2013-10-21
  Administered 2013-10-22: 04:00:00 via INTRAVENOUS

## 2013-10-22 MED ORDER — ACETAMINOPHEN 500 MG TAB
500 mg | ORAL | Status: AC
Start: 2013-10-22 — End: 2013-10-22
  Administered 2013-10-22: 06:00:00 via ORAL

## 2013-10-22 MED ORDER — SODIUM CHLORIDE 0.9 % IV
50 mg/mL | INTRAVENOUS | Status: AC
Start: 2013-10-22 — End: 2013-10-22
  Administered 2013-10-22: 05:00:00 via INTRAVENOUS

## 2013-10-22 MED ORDER — FAMOTIDINE (PF) 20 MG/2 ML IV
20 mg/2 mL | INTRAVENOUS | Status: AC
Start: 2013-10-22 — End: 2013-10-21
  Administered 2013-10-22: 03:00:00 via INTRAVENOUS

## 2013-10-22 MED ORDER — ALUM-MAG HYDROXIDE-SIMETH 200 MG-200 MG-20 MG/5 ML ORAL SUSP
200-200-20 mg/5 mL | Freq: Once | ORAL | Status: AC
Start: 2013-10-22 — End: 2013-10-21
  Administered 2013-10-22: 03:00:00 via ORAL

## 2013-10-22 MED ORDER — LORAZEPAM 2 MG/ML IJ SOLN
2 mg/mL | INTRAMUSCULAR | Status: AC
Start: 2013-10-22 — End: 2013-10-22
  Administered 2013-10-22: 07:00:00 via INTRAVENOUS

## 2013-10-22 NOTE — ED Notes (Signed)
PT. Resting in bed at this time asleep, stable will continue to monitor.

## 2013-10-22 NOTE — ED Notes (Signed)
Assumed pt. Care - pt. Arrives visibly intoxicated was seen here earlier today and released for same complaint - pt. Placed on cardiac monitor - VSS, pt. Stable, will continue to monitor.

## 2013-10-22 NOTE — ED Notes (Addendum)
PT. Resting in bed at this time asleep, stable will continue to monitor.

## 2013-10-22 NOTE — ED Provider Notes (Signed)
HPI Comments: Jeremy Walters is a 52 y.o. Male with PMH including chronic pancreatitis, seizure, alcoholism, HTN, and GERD who returns to the ED via EMS for evaluation. The patient was seen in the ED yesterday and discharged this morning for ETOH intoxication. He returns to the ED via EMS for the same. Patient admits to drinking one bottle of alcohol. No further history is available at this time.     The history is provided by the patient.        Past Medical History   Diagnosis Date   ??? Pancreatitis chronic    ??? Seizure    ??? Alcoholism    ??? Seizures    ??? Hypertension    ??? GERD (gastroesophageal reflux disease)         Past Surgical History   Procedure Laterality Date   ??? Hx gi       EGD   ??? Egd  07/09/2012         ??? Colonoscopy  07/09/2012               Family History   Problem Relation Age of Onset   ??? Hypertension Other         History     Social History   ??? Marital Status: SINGLE     Spouse Name: N/A     Number of Children: N/A   ??? Years of Education: N/A     Occupational History   ??? Not on file.     Social History Main Topics   ??? Smoking status: Current Every Day Smoker   ??? Smokeless tobacco: Not on file   ??? Alcohol Use: Yes   ??? Drug Use: Not on file   ??? Sexually Active: Not on file     Other Topics Concern   ??? Not on file     Social History Narrative   ??? No narrative on file       ALLERGIES: Morphine and Shellfish derived      Review of Systems   Constitutional: Negative for fever and chills.   HENT: Negative for congestion, sore throat and rhinorrhea.    Eyes: Negative.    Respiratory: Negative for cough and shortness of breath.    Cardiovascular: Negative for chest pain.   Gastrointestinal: Negative for nausea, vomiting, abdominal pain and diarrhea.   Endocrine: Negative.    Genitourinary: Negative.    Musculoskeletal: Negative for back pain.   Skin: Negative for rash.   Allergic/Immunologic: Negative.    Neurological: Negative for dizziness, syncope, weakness, light-headedness and headaches.   Hematological:  Negative.    Psychiatric/Behavioral: Negative for suicidal ideas and self-injury. The patient is not nervous/anxious.         (+)Alcohol intoxication   All other systems reviewed and are negative.        Filed Vitals:    10/22/13 1325   Pulse: 89   Temp: 98.7 ??F (37.1 ??C)   Resp: 12   Height: 5\' 7"  (1.702 m)   Weight: 56.7 kg (125 lb)   SpO2: 100%            Physical Exam   Nursing note and vitals reviewed.  Constitutional: He is oriented to person, place, and time. He appears well-developed and well-nourished. No distress.   HENT:   Head: Normocephalic and atraumatic.   Mouth/Throat: Oropharynx is clear and moist.   Eyes: Conjunctivae and EOM are normal. Pupils are equal, round, and reactive to light. No scleral icterus.  Neck: Normal range of motion. Neck supple.   Cardiovascular: Normal rate, regular rhythm and normal heart sounds.    No murmur heard.  Pulmonary/Chest: Effort normal and breath sounds normal. No respiratory distress.   Abdominal: Soft. Bowel sounds are normal. He exhibits no distension. There is no tenderness.   Musculoskeletal: Normal range of motion. He exhibits no edema and no tenderness.   Lymphadenopathy:     He has no cervical adenopathy.   Neurological: He is alert and oriented to person, place, and time. Coordination normal.   Skin: Skin is warm and dry. No rash noted.   Psychiatric:   He is intoxicated.        MDM     Differential Diagnosis; Clinical Impression; Plan:     Pt intoxicated, fu pcp       Amount and/or Complexity of Data Reviewed:   Clinical lab tests:  Ordered   Decide to obtain previous medical records or to obtain history from someone other than the patient:  No   Obtain history from someone other than the patient:  No   Review and summarize past medical records:  Yes      Procedures    PROGRESS NOTES  1:19 PM: Nohely Whitehorn D Tyquisha Sharps, MD arrives to the bedside to evaluate the patient. Answered the patient's questions regarding the treatment plan. The patient was just discharged  home this morning.       CONSULTATIONS  None      ED PHYSICIAN ORDERS  No orders of the defined types were placed in this encounter.           MEDICATIONS ORDERED  Medications - No data to display        RADIOLOGY INTERPRETATIONS           EKG READINGS/LABORATORY RESULTS  Recent Results (from the past 12 hour(s))   AMMONIA    Collection Time     10/22/13  1:50 AM       Result Value Range    Ammonia 20  11 - 32 UMOL/L   ETHYL ALCOHOL    Collection Time     10/22/13  5:55 AM       Result Value Range    ALCOHOL(ETHYL),SERUM 133 (*) 0 - 3 MG/DL         ED DIAGNOSIS & DISPOSITION INFORMATION  Diagnosis: No diagnosis found.      Disposition: home      Follow-up Information    None          Patient's Medications   Start Taking    No medications on file   Continue Taking    LORAZEPAM (ATIVAN) 1 MG TABLET    Take 1-2 Tabs by mouth every six (6) hours as needed for 15 doses.    PHENYTOIN (DILANTIN INFATABS) 50 MG TABLET    Take 4 tablets by mouth daily.    PHENYTOIN ER (DILANTIN) 100 MG ER CAPSULE    100 mg.   These Medications have changed    No medications on file   Stop Taking    No medications on file         SCRIBE ATTESTATION STATEMENT  Documented by: Zenita N. Wharf, scribing for and in the presence of Margarite Vessel D Phylis Javed, MD. (1:19 PM)    PROVIDER ATTESTATION STATEMENT  I personally performed the services described in the documentation, reviewed the documentation, as recorded by the scribe in my presence, and it accurately and completely records my words and actions.  Edker Punt Marlene Lard, MD.

## 2013-10-22 NOTE — ED Provider Notes (Addendum)
I was personally available for consultation in the emergency department.  I have reviewed the chart prior to disposition and discussed with MLP.  I agree with the documentation recorded by the [facility], including the assessment, treatment plan, and disposition.  Hilton Cork, MD        6:27 AM  Pt ETOH level 130s at 6 AM.   HR 85, sating 100% on RA, no complaints BP 121/80  Will reassess at 7 AM and if stable gait and fluent speech and clinically sober will discharge home    7:11 AM  Pt puts his own shoes on, walks to bathroom, fluent speech

## 2013-10-22 NOTE — ED Notes (Signed)
Patient armband removed and given to patient to take home.  Patient was informed of the privacy risks if armband lost or stolen  I have reviewed discharge instructions with the patient.  The patient verbalized understanding.  Patient denies any further needs at this time. NAD noted. Patient states "my dad is coming to pick me up".

## 2013-10-22 NOTE — ED Notes (Signed)
Gave patient ice water per request.

## 2013-10-22 NOTE — ED Notes (Signed)
Pt awake, alert. Pt given urinal for c/o need to void.

## 2013-10-22 NOTE — ED Notes (Signed)
PT.  Moved to hallway bed by charge RN - Boneta Lucks, RN un assumed pt. Care.

## 2013-10-22 NOTE — ED Notes (Signed)
Patient asleep in stretcher. Will continue to monitor.

## 2013-10-22 NOTE — ED Notes (Signed)
Received verbal report from Jeremy Walters., RN. Patient asleep in stretcher on monitor. Will continue to monitor.

## 2013-10-23 LAB — CULTURE, URINE
Culture result:: NO GROWTH
Culture: NO GROWTH

## 2013-10-23 LAB — ETHYL ALCOHOL: ALCOHOL(ETHYL),SERUM: 73 MG/DL — ABNORMAL HIGH (ref 0–3)

## 2013-10-23 MED ORDER — LORAZEPAM 2 MG/ML IJ SOLN
2 mg/mL | INTRAMUSCULAR | Status: DC
Start: 2013-10-23 — End: 2013-10-23

## 2013-10-23 NOTE — ED Notes (Signed)
I discharge pt yest AM for ETOH intoxication and loaded his sz med. He returns to the ED s ETOH intox again.   Pt was observed in the ED on tele monitor s any sig events.   Upon discharge pt is clinically sober s any comp and EtOH 73  MAE well, fluent speech, well appearing.   -------------------------------------------------------------------------------------------------------------------      LABORATORY RESULTS:  Recent Results (from the past 12 hour(s))   ETHYL ALCOHOL    Collection Time     10/23/13  3:25 AM       Result Value Range    ALCOHOL(ETHYL),SERUM 73 (*) 0 - 3 MG/DL     D/W pt regarding possible worsening of pt's condition, need for PMD follow up and strict ED return instructions for any worsening symptoms.     DISPOSITION:  ED DIAGNOSIS & DISPOSITION INFORMATION  Diagnosis:   1. Alcohol intoxication      Disposition: home    Follow-up Information    Follow up With Details Comments Contact Info    [facility] In 2 days Return to the ED at once for any worsening signs or symptoms. [address]  [place] [address]  [phone]          Patient's Medications   Start Taking    No medications on file   Continue Taking    LORAZEPAM (ATIVAN) 1 MG TABLET    Take 1-2 Tabs by mouth every six (6) hours as needed for 15 doses.    PHENYTOIN (DILANTIN INFATABS) 50 MG TABLET    Take 4 tablets by mouth daily.    PHENYTOIN ER (DILANTIN) 100 MG ER CAPSULE    100 mg.   These Medications have changed    No medications on file   Stop Taking    No medications on file

## 2013-10-23 NOTE — ED Notes (Signed)
Patient given urinal.

## 2013-10-23 NOTE — ED Notes (Signed)
I have reviewed discharge instructions with the patient.  The patient verbalized understanding. Patient armband removed and shredded. Patient ambulated out with a steady gait. No distress upon discharge

## 2013-10-23 NOTE — ED Notes (Signed)
Patient's heart rate is down to 100s. Informed Dr. Langston Masker. Dr. Langston Masker states to leave orders as standing orders and to monitor patient.

## 2013-11-01 NOTE — ED Notes (Signed)
"  I just got a pimple on my butt I need looked at."

## 2013-11-01 NOTE — ED Notes (Signed)
Patient LWBS

## 2013-11-09 DIAGNOSIS — R079 Chest pain, unspecified: Secondary | ICD-10-CM

## 2013-11-09 HISTORY — DX: Chest pain, unspecified: R07.9
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
## 2013-12-05 LAB — CBC WITH AUTOMATED DIFF
ABS. BASOPHILS: 0 10*3/uL (ref 0.0–0.06)
ABS. EOSINOPHILS: 0 10*3/uL (ref 0.0–0.4)
ABS. LYMPHOCYTES: 2.6 10*3/uL (ref 0.9–3.6)
ABS. MONOCYTES: 0.5 10*3/uL (ref 0.05–1.2)
ABS. NEUTROPHILS: 1.5 10*3/uL — ABNORMAL LOW (ref 1.8–8.0)
BASOPHILS: 1 % (ref 0–2)
EOSINOPHILS: 0 % (ref 0–5)
HCT: 36.8 % (ref 36.0–48.0)
HGB: 12.3 g/dL — ABNORMAL LOW (ref 13.0–16.0)
LYMPHOCYTES: 55 % — ABNORMAL HIGH (ref 21–52)
MCH: 32.1 PG (ref 24.0–34.0)
MCHC: 33.4 g/dL (ref 31.0–37.0)
MCV: 96.1 FL (ref 74.0–97.0)
MONOCYTES: 11 % — ABNORMAL HIGH (ref 3–10)
MPV: 9.5 FL (ref 9.2–11.8)
NEUTROPHILS: 33 % — ABNORMAL LOW (ref 40–73)
PLATELET: 136 10*3/uL (ref 135–420)
RBC: 3.83 M/uL — ABNORMAL LOW (ref 4.70–5.50)
RDW: 13.8 % (ref 11.6–14.5)
WBC: 4.7 10*3/uL (ref 4.6–13.2)

## 2013-12-05 LAB — METABOLIC PANEL, COMPREHENSIVE
A-G Ratio: 1.2 (ref 0.8–1.7)
ALT (SGPT): 27 U/L (ref 16–61)
AST (SGOT): 44 U/L — ABNORMAL HIGH (ref 15–37)
Albumin: 3.7 g/dL (ref 3.4–5.0)
Alk. phosphatase: 156 U/L — ABNORMAL HIGH (ref 45–117)
Anion gap: 6 mmol/L (ref 3.0–18)
BUN/Creatinine ratio: 15 (ref 12–20)
BUN: 10 MG/DL (ref 7.0–18)
Bilirubin, total: 0.3 MG/DL (ref 0.2–1.0)
CO2: 29 mmol/L (ref 21–32)
Calcium: 8.2 MG/DL — ABNORMAL LOW (ref 8.5–10.1)
Chloride: 106 mmol/L (ref 100–108)
Creatinine: 0.67 MG/DL (ref 0.6–1.3)
GFR est AA: >60 mL/min/{1.73_m2} (ref >60)
GFR est non-AA: >60 mL/min/{1.73_m2} (ref >60)
Globulin: 3.2 g/dL (ref 2.0–4.0)
Glucose: 91 mg/dL (ref 74–99)
Potassium: 4 mmol/L (ref 3.5–5.5)
Protein, total: 6.9 g/dL (ref 6.4–8.2)
Sodium: 141 mmol/L (ref 136–145)

## 2013-12-05 LAB — LIPASE: Lipase: 88 U/L (ref 73–393)

## 2014-05-24 NOTE — ED Notes (Signed)
Pt now reports he is having swelling in his throat, pt speaking clearly in complete sentences.  +ETOH.

## 2014-05-24 NOTE — ED Notes (Signed)
Patient called to [facility] x 4. No  Answer in waiting room, restroom, or outside in front of ED.
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
## 2014-05-30 NOTE — ED Provider Notes (Signed)
Patient is a 53 y.o. male presenting with Insect Bite.   Insect Bite  The problem has been gradually worsening.      LWBS    Past Medical History   Diagnosis Date   ??? Pancreatitis chronic    ??? Seizure (HCC)    ??? Alcoholism (HCC)    ??? Seizures (HCC)    ??? Hypertension    ??? GERD (gastroesophageal reflux disease)         Past Surgical History   Procedure Laterality Date   ??? Hx gi       EGD   ??? Egd  07/09/2012         ??? Colonoscopy  07/09/2012               Family History   Problem Relation Age of Onset   ??? Hypertension Other         History     Social History   ??? Marital Status: SINGLE     Spouse Name: N/A     Number of Children: N/A   ??? Years of Education: N/A     Occupational History   ??? Not on file.     Social History Main Topics   ??? Smoking status: Current Every Day Smoker   ??? Smokeless tobacco: Not on file   ??? Alcohol Use: Yes   ??? Drug Use: Not on file   ??? Sexual Activity: Not on file     Other Topics Concern   ??? Not on file     Social History Narrative                  ALLERGIES: Morphine and Shellfish derived      Review of Systems    Filed Vitals:    05/24/14 2120   BP: 134/89   Pulse: 120   Temp: 98.3 ??F (36.8 ??C)   Resp: 16   SpO2: 99%            Physical Exam     MDM    Procedures    LWBS

## 2014-06-01 NOTE — ED Notes (Signed)
Still no answer, patient not outside

## 2014-06-01 NOTE — ED Notes (Signed)
No answer when called, looked for patient outside, no answer.

## 2014-06-08 LAB — CARDIAC PANEL,(CK, CKMB & TROPONIN)
CK - MB: 5.6 ng/ml — ABNORMAL HIGH (ref 0.5–3.6)
CK-MB Index: 1.9 % (ref 0.0–4.0)
CK: 292 U/L (ref 39–308)
Troponin-I, QT: <0.02 NG/ML (ref 0.0–0.045)

## 2014-06-08 LAB — CBC WITH AUTOMATED DIFF
ABS. BASOPHILS: 0 10*3/uL (ref 0.0–0.06)
ABS. EOSINOPHILS: 0 10*3/uL (ref 0.0–0.4)
ABS. LYMPHOCYTES: 2 10*3/uL (ref 0.9–3.6)
ABS. MONOCYTES: 0.4 10*3/uL (ref 0.05–1.2)
ABS. NEUTROPHILS: 1 10*3/uL — ABNORMAL LOW (ref 1.8–8.0)
BASOPHILS: 0 % (ref 0–2)
EOSINOPHILS: 1 % (ref 0–5)
HCT: 33.7 % — ABNORMAL LOW (ref 36.0–48.0)
HGB: 11.9 g/dL — ABNORMAL LOW (ref 13.0–16.0)
LYMPHOCYTES: 58 % — ABNORMAL HIGH (ref 21–52)
MCH: 31.1 PG (ref 24.0–34.0)
MCHC: 35.3 g/dL (ref 31.0–37.0)
MCV: 88 FL (ref 74.0–97.0)
MONOCYTES: 12 % — ABNORMAL HIGH (ref 3–10)
MPV: 9.1 FL — ABNORMAL LOW (ref 9.2–11.8)
NEUTROPHILS: 29 % — ABNORMAL LOW (ref 40–73)
PLATELET: 111 10*3/uL — ABNORMAL LOW (ref 135–420)
RBC: 3.83 M/uL — ABNORMAL LOW (ref 4.70–5.50)
RDW: 13.5 % (ref 11.6–14.5)
WBC: 3.4 10*3/uL — ABNORMAL LOW (ref 4.6–13.2)

## 2014-06-08 LAB — DRUG SCREEN, URINE
AMPHETAMINES: NEGATIVE
BARBITURATES: NEGATIVE
BENZODIAZEPINES: NEGATIVE
COCAINE: NEGATIVE
METHADONE: NEGATIVE
OPIATES: NEGATIVE
PCP(PHENCYCLIDINE): NEGATIVE
THC (TH-CANNABINOL): POSITIVE — AB

## 2014-06-08 LAB — EKG, 12 LEAD, INITIAL
Atrial Rate: 96 {beats}/min
Calculated P Axis: 60 degrees
Calculated R Axis: 43 degrees
Calculated T Axis: 60 degrees
Diagnosis: NORMAL
P-R Interval: 154 ms
Q-T Interval: 368 ms
QRS Duration: 98 ms
QTC Calculation (Bezet): 464 ms
Ventricular Rate: 96 {beats}/min

## 2014-06-08 LAB — URINALYSIS W/ RFLX MICROSCOPIC
Bilirubin: NEGATIVE
Blood: NEGATIVE
Glucose: NEGATIVE mg/dL
Leukocyte Esterase: NEGATIVE
Nitrites: NEGATIVE
Protein: NEGATIVE mg/dL
Specific gravity: 1.025 (ref 1.003–1.030)
Urobilinogen: 0.2 EU/dL (ref 0.2–1.0)
pH (UA): 5.5 (ref 5.0–8.0)

## 2014-06-08 LAB — HEPATIC FUNCTION PANEL
A-G Ratio: 1.2 (ref 0.8–1.7)
ALT (SGPT): 29 U/L (ref 16–61)
AST (SGOT): 41 U/L — ABNORMAL HIGH (ref 15–37)
Albumin: 3.7 g/dL (ref 3.4–5.0)
Alk. phosphatase: 170 U/L — ABNORMAL HIGH (ref 45–117)
Bilirubin, direct: 0.2 MG/DL (ref 0.0–0.2)
Bilirubin, total: 0.7 MG/DL (ref 0.2–1.0)
Globulin: 3.1 g/dL (ref 2.0–4.0)
Protein, total: 6.8 g/dL (ref 6.4–8.2)

## 2014-06-08 LAB — ETHYL ALCOHOL: ALCOHOL(ETHYL),SERUM: 232 MG/DL — ABNORMAL HIGH (ref 0–3)

## 2014-06-08 LAB — LIPASE: Lipase: 89 U/L (ref 73–393)

## 2014-06-08 LAB — METABOLIC PANEL, BASIC
Anion gap: 9 mmol/L (ref 3.0–18)
BUN/Creatinine ratio: 12 (ref 12–20)
BUN: 8 MG/DL (ref 7.0–18)
CO2: 27 mmol/L (ref 21–32)
Calcium: 8 MG/DL — ABNORMAL LOW (ref 8.5–10.1)
Chloride: 106 mmol/L (ref 100–108)
Creatinine: 0.69 MG/DL (ref 0.6–1.3)
GFR est AA: >60 mL/min/{1.73_m2} (ref >60)
GFR est non-AA: >60 mL/min/{1.73_m2} (ref >60)
Glucose: 97 mg/dL (ref 74–99)
Potassium: 3.6 mmol/L (ref 3.5–5.5)
Sodium: 142 mmol/L (ref 136–145)

## 2014-06-08 MED ORDER — IBUPROFEN 400 MG TAB
400 mg | ORAL | Status: AC
Start: 2014-06-08 — End: 2014-06-08
  Administered 2014-06-08: 22:00:00 via ORAL

## 2014-06-08 MED FILL — BD POSIFLUSH NORMAL SALINE 0.9 % INJECTION SYRINGE: INTRAMUSCULAR | Qty: 10

## 2014-06-08 MED FILL — IBUPROFEN 400 MG TAB: 400 mg | ORAL | Qty: 2

## 2014-06-08 NOTE — ED Notes (Signed)
Dinner tray provided.

## 2014-06-08 NOTE — ED Notes (Signed)
Brought in by EMS, per EMS they were contacted by PD due to patient reporting chest pain on scene. Patient states he started having chest pain 1 & 1/2 hours ago and drank alcohol to help with his pain. Per patient he drank  "a whole 40" EMS gave ASA 324 mg en route. Upon arrival to ED patient is calm and in no distress. Rates pain to mid chest 5/10, "sharp." Patient states, "I just don't think i'm used to this heat." Patient denies being homeless and states he was headed to a friend's house.     Placed on cardiac monitor, NiBP & SpO2 monitors with alarms set.

## 2014-06-08 NOTE — ED Provider Notes (Signed)
HPI Comments: Jeremy Walters is a 53 y.o. Male Hx Chronic Pancreatitis, presents to the ED via EMS C/O Chest Pain that onset an hour and half prior to calling 911. Patient states prior to the onset of Chest Pain he consumed a moderate amount of ETOH-"[place] and switched to 211." EMS administered aspirin on their arrival. Patient rates the Chest Pain as a five out ten at the bedside. Patient denies Hx CAD. Patient denies Back or Jaw Pain. Patient denies any other complaints at this time.     Patient is a 53 y.o. male presenting with chest pain. The history is provided by the patient.   Chest Pain (Angina)   Pertinent negatives include no abdominal pain, no cough, no dizziness, no fever, no headaches, no palpitations, no shortness of breath, no vomiting and no weakness.        Past Medical History   Diagnosis Date   ??? Pancreatitis chronic    ??? Seizure ([place])    ??? Alcoholism ([place])    ??? Seizures ([place])    ??? Hypertension    ??? GERD (gastroesophageal reflux disease)         Past Surgical History   Procedure Laterality Date   ??? Hx gi       EGD   ??? Egd  07/09/2012         ??? Colonoscopy  07/09/2012               Family History   Problem Relation Age of Onset   ??? Hypertension Other         History     Social History   ??? Marital Status: SINGLE     Spouse Name: N/A     Number of Children: N/A   ??? Years of Education: N/A     Occupational History   ??? Not on file.     Social History Main Topics   ??? Smoking status: Current Every Day Smoker   ??? Smokeless tobacco: Not on file   ??? Alcohol Use: Yes      Comment: "I drink everyday"    ??? Drug Use: Not on file   ??? Sexual Activity: Not on file     Other Topics Concern   ??? Not on file     Social History Narrative                  ALLERGIES: Morphine and Shellfish derived      Review of Systems   Constitutional: Negative for fever and chills.   HENT: Negative for congestion and sore throat.    Eyes: Negative for redness and visual disturbance.    Respiratory: Negative for cough, chest tightness and shortness of breath.    Cardiovascular: Positive for chest pain. Negative for palpitations.   Gastrointestinal: Negative for vomiting, abdominal pain and diarrhea.   Genitourinary: Negative for difficulty urinating.   Musculoskeletal: Negative for myalgias and arthralgias.   Skin: Negative for rash.   Neurological: Negative for dizziness, seizures, weakness and headaches.   Psychiatric/Behavioral: Negative.        Filed Vitals:    06/08/14 1355   BP: 125/101   Pulse: 88   Temp: 98.9 ??F (37.2 ??C)   Resp: 16   Height: 5' 6"  (1.676 m)   Weight: 54.432 kg (120 lb)   SpO2: 98%            Physical Exam   Constitutional: He is oriented to person, place, and time. He  appears well-developed and well-nourished. No distress.   53 year old Caucasian male in no acute distress. (+) ETOH on breath   HENT:   Head: Normocephalic and atraumatic.   Right Ear: External ear normal.   Left Ear: External ear normal.   Nose: Nose normal.   Mouth/Throat: Oropharynx is clear and moist. No oropharyngeal exudate.   Eyes: Conjunctivae and EOM are normal. Pupils are equal, round, and reactive to light. Right eye exhibits no discharge. Left eye exhibits no discharge. No scleral icterus.   Neck: Normal range of motion. Neck supple. No JVD present. No tracheal deviation present. No thyromegaly present.   Cardiovascular: Normal rate, regular rhythm, normal heart sounds and intact distal pulses.  Exam reveals no gallop and no friction rub.    No murmur heard.  Pulmonary/Chest: Effort normal and breath sounds normal. No stridor. No respiratory distress. He has no wheezes. He has no rales. He exhibits no tenderness.   Abdominal: Soft. Bowel sounds are normal. He exhibits no distension and no mass. There is no tenderness. There is no rebound and no guarding.   Genitourinary: Penis normal.   Musculoskeletal: Normal range of motion. He exhibits no edema or tenderness.   Lymphadenopathy:      He has no cervical adenopathy.   Neurological: He is alert and oriented to person, place, and time. He has normal reflexes. No cranial nerve deficit. He exhibits normal muscle tone. Coordination normal.   Skin: Skin is warm and dry. No rash noted. He is not diaphoretic. No erythema. No pallor.   Psychiatric: He has a normal mood and affect. His behavior is normal. Judgment and thought content normal.   Nursing note and vitals reviewed.       MDM  Number of Diagnoses or Management Options  Alcohol intoxication, with unspecified complication ([place]):   Atypical chest pain:   Diagnosis management comments: Differential includes:  Alcohol abuse, alcohol intoxication, metabolic derangement, arrhyhmia. GERD, ACS unlikely.       Amount and/or Complexity of Data Reviewed  Clinical lab tests: ordered and reviewed  Tests in the radiology section of CPT??: ordered and reviewed  Tests in the medicine section of CPT??: ordered and reviewed  Obtain history from someone other than the patient: yes  Review and summarize past medical records: yes  Independent visualization of images, tracings, or specimens: yes    Risk of Complications, Morbidity, and/or Mortality  Presenting problems: high  Diagnostic procedures: high  Management options: high    Patient Progress  Patient progress: stable      Procedures      -------------------------------------------------------------------------------------------------------------------  PROGRESS NOTES  2:20 PM: Andres Labrum, DO arrives to the bedside to evaluate the patient. Answered the patient's questions regarding the treatment plan.    3:28 PM: Patient resting in no distress.     4:28 PM: Patient in no distress, sitting up in bed and combing his hair.     4:49 PM: Patient talking and laughing with his friend at the bedside.     CONSULTATIONS  none      ED PHYSICIAN ORDERS  Orders Placed This Encounter   ??? XR CHEST PA LAT     Standing Status: Standing      Number of Occurrences: 1       Standing Expiration Date:      Order Specific Question:  Transport     Answer:  Biomedical scientist [[id number]]     Order Specific Question:  Reason for Exam  Answer:  SOB   ??? CBC WITH AUTOMATED DIFF     Standing Status: Standing      Number of Occurrences: 1      Standing Expiration Date:    ??? METABOLIC PANEL, BASIC     Standing Status: Standing      Number of Occurrences: 1      Standing Expiration Date:    ??? ETHYL ALCOHOL     Standing Status: Standing      Number of Occurrences: 1      Standing Expiration Date:    ??? HEPATIC FUNCTION PANEL     Standing Status: Standing      Number of Occurrences: 1      Standing Expiration Date:    ??? LIPASE     Standing Status: Standing      Number of Occurrences: 1      Standing Expiration Date:    ??? URINALYSIS W/ RFLX MICROSCOPIC     Standing Status: Standing      Number of Occurrences: 1      Standing Expiration Date:    ??? DRUG SCREEN, URINE     Standing Status: Standing      Number of Occurrences: 1      Standing Expiration Date:    ??? CARDIAC PANEL,(CK, CKMB & TROPONIN)     Standing Status: Standing      Number of Occurrences: 1      Standing Expiration Date:    ??? CARDIAC MONITORING     Standing Status: Standing      Number of Occurrences: 1      Standing Expiration Date:      Order Specific Question:  Type:     Answer:  Bedside     Order Specific Question:  Off unit:     Answer:  w/out Tele or Nurse   ??? PULSE OXIMETRY CONTINUOUS     Standing Status: Standing      Number of Occurrences: 1      Standing Expiration Date:    ??? EKG, 12 LEAD, INITIAL     Standing Status: Standing      Number of Occurrences: 1      Standing Expiration Date:      Order Specific Question:  Reason for Exam:     Answer:  chest pain   ??? SALINE LOCK IV ONE TIME Routine     Standing Status: Standing      Number of Occurrences: 1      Standing Expiration Date:    ??? BLOOD PRESSURE MONITOR     Standing Status: Standing      Number of Occurrences: 1      Standing Expiration Date:    ??? sodium chloride (NS) flush 5-10 mL      Sig:    ??? sodium chloride (NS) flush 5-10 mL     Sig:    ??? ibuprofen (MOTRIN) tablet 800 mg     Sig:            MEDICATIONS ORDERED  Medications   sodium chloride (NS) flush 5-10 mL (not administered)   sodium chloride (NS) flush 5-10 mL (not administered)   ibuprofen (MOTRIN) tablet 800 mg (not administered)           RADIOLOGY INTERPRETATIONS  XR CHEST PA LAT   Final Result      Findings: The lungs appear clear. No pneumothorax. Cardiac silhouette and  pulmonary vascularity are within normal limits. Costophrenic angles are sharp.  IMPRESSION:  No acute cardiopulmonary disease.        EKG READINGS/LABORATORY RESULTS  Recent Results (from the past 12 hour(s))   EKG, 12 LEAD, INITIAL    Collection Time: 06/08/14  1:54 PM   Result Value Ref Range    Ventricular Rate 96 BPM    Atrial Rate 96 BPM    P-R Interval 154 ms    QRS Duration 98 ms    Q-T Interval 368 ms    QTC Calculation (Bezet) 464 ms    Calculated P Axis 60 degrees    Calculated R Axis 43 degrees    Calculated T Axis 60 degrees    Diagnosis       Normal sinus rhythm  LVH by voltage criteria only.  Confirmed by Perrin Smack ([id number]) on 06/08/2014 5:34:48 PM     CBC WITH AUTOMATED DIFF    Collection Time: 06/08/14  2:23 PM   Result Value Ref Range    WBC 3.4 (L) 4.6 - 13.2 K/uL    RBC 3.83 (L) 4.70 - 5.50 M/uL    HGB 11.9 (L) 13.0 - 16.0 g/dL    HCT 33.7 (L) 36.0 - 48.0 %    MCV 88.0 74.0 - 97.0 FL    MCH 31.1 24.0 - 34.0 PG    MCHC 35.3 31.0 - 37.0 g/dL    RDW 13.5 11.6 - 14.5 %    PLATELET 111 (L) 135 - 420 K/uL    MPV 9.1 (L) 9.2 - 11.8 FL    NEUTROPHILS 29 (L) 40 - 73 %    LYMPHOCYTES 58 (H) 21 - 52 %    MONOCYTES 12 (H) 3 - 10 %    EOSINOPHILS 1 0 - 5 %    BASOPHILS 0 0 - 2 %    ABS. NEUTROPHILS 1.0 (L) 1.8 - 8.0 K/UL    ABS. LYMPHOCYTES 2.0 0.9 - 3.6 K/UL    ABS. MONOCYTES 0.4 0.05 - 1.2 K/UL    ABS. EOSINOPHILS 0.0 0.0 - 0.4 K/UL    ABS. BASOPHILS 0.0 0.0 - 0.06 K/UL    DF AUTOMATED     METABOLIC PANEL, BASIC    Collection Time: 06/08/14  2:23 PM    Result Value Ref Range    Sodium 142 136 - 145 mmol/L    Potassium 3.6 3.5 - 5.5 mmol/L    Chloride 106 100 - 108 mmol/L    CO2 27 21 - 32 mmol/L    Anion gap 9 3.0 - 18 mmol/L    Glucose 97 74 - 99 mg/dL    BUN 8 7.0 - 18 MG/DL    Creatinine 0.69 0.6 - 1.3 MG/DL    BUN/Creatinine ratio 12 12 - 20      GFR est AA >60 >60 ml/min/1.63m    GFR est non-AA >60 >60 ml/min/1.723m   Calcium 8.0 (L) 8.5 - 10.1 MG/DL   ETHYL ALCOHOL    Collection Time: 06/08/14  2:23 PM   Result Value Ref Range    ALCOHOL(ETHYL),SERUM 232 (H) 0 - 3 MG/DL   HEPATIC FUNCTION PANEL    Collection Time: 06/08/14  2:23 PM   Result Value Ref Range    Protein, total 6.8 6.4 - 8.2 g/dL    Albumin 3.7 3.4 - 5.0 g/dL    Globulin 3.1 2.0 - 4.0 g/dL    A-G Ratio 1.2 0.8 - 1.7      Bilirubin, total 0.7 0.2 - 1.0 MG/DL  Bilirubin, direct 0.2 0.0 - 0.2 MG/DL    Alk. phosphatase 170 (H) 45 - 117 U/L    AST 41 (H) 15 - 37 U/L    ALT 29 16 - 61 U/L   LIPASE    Collection Time: 06/08/14  2:23 PM   Result Value Ref Range    Lipase 89 73 - 393 U/L   CARDIAC PANEL,(CK, CKMB & TROPONIN)    Collection Time: 06/08/14  2:23 PM   Result Value Ref Range    CK 292 39 - 308 U/L    CK - MB 5.6 (H) 0.5 - 3.6 ng/ml    CK-MB Index 1.9 0.0 - 4.0 %    Troponin-I, Qt. <0.02 0.0 - 0.045 NG/ML   URINALYSIS W/ RFLX MICROSCOPIC    Collection Time: 06/08/14  3:00 PM   Result Value Ref Range    Color YELLOW      Appearance CLEAR      Specific gravity 1.025 1.003 - 1.030      pH (UA) 5.5 5.0 - 8.0      Protein NEGATIVE  NEG mg/dL    Glucose NEGATIVE  NEG mg/dL    Ketone TRACE (A) NEG mg/dL    Bilirubin NEGATIVE  NEG      Blood NEGATIVE  NEG      Urobilinogen 0.2 0.2 - 1.0 EU/dL    Nitrites NEGATIVE  NEG      Leukocyte Esterase NEGATIVE  NEG     DRUG SCREEN, URINE    Collection Time: 06/08/14  3:00 PM   Result Value Ref Range    BENZODIAZEPINE NEGATIVE  NEG      BARBITURATES NEGATIVE  NEG      THC (TH-CANNABINOL) POSITIVE (A) NEG      OPIATES NEGATIVE  NEG       PCP(PHENCYCLIDINE) NEGATIVE  NEG      COCAINE NEGATIVE  NEG      AMPHETAMINE NEGATIVE  NEG      METHADONE NEGATIVE       HDSCOM (NOTE)          ED DIAGNOSIS & DISPOSITION INFORMATION  Diagnosis:   1. Alcohol intoxication, with unspecified complication ([place])    2. Atypical chest pain          Disposition: Discharged     Follow-up Information    Follow up With Details Comments Contact Info    [place] Schedule an appointment as soon as possible for a visit in 1 day  [place]    [facility] EMERGENCY DEPT  Return at once for any worsening symptoms.  [place]          Patient's Medications   Start Taking    No medications on file   Continue Taking    LORAZEPAM (ATIVAN) 1 MG TABLET    Take 1-2 Tabs by mouth every six (6) hours as needed for 15 doses.    PHENYTOIN (DILANTIN INFATABS) 50 MG TABLET    Take 4 tablets by mouth daily.    PHENYTOIN ER (DILANTIN) 100 MG ER CAPSULE    100 mg.   These Medications have changed    No medications on file   Stop Taking    No medications on file         SCRIBE ATTESTATION STATEMENT  Documented by: Allie Dimmer & Robie Ridge, scribing for and in the presence  of Andres Labrum, DO. (2:20 PM)    PROVIDER ATTESTATION STATEMENT  I personally performed the services described in the documentation, reviewed the documentation, as recorded by the scribe in my presence, and it accurately and completely records my words and actions.  Andres Labrum, DO.   -------------------------------------------------------------------------------------------------------------------

## 2014-06-08 NOTE — ED Notes (Signed)
Patient remains calm and in relaxed position. Denies any needs at this time.

## 2014-06-08 NOTE — ED Notes (Signed)
Patient verbalizes his feet have been bothering him and giving him pain, asks for something for pain. EDP made aware.

## 2014-06-08 NOTE — ED Notes (Signed)
I have reviewed discharge instructions with the patient.  The patient verbalized understanding.  Patient armband removed and shredded   Discharge instructions reviewed with patient and patient verbalized understanding of all discharge instructions.  Patient signed paper discharge instructions due to electronic signature pad unavailable.

## 2014-08-15 ENCOUNTER — Inpatient Hospital Stay
Admit: 2014-08-15 | Discharge: 2014-08-15 | Disposition: A | Discharge status: Home / Self Care | Payer: Self-pay | Attending: Emergency Medicine

## 2014-08-15 DIAGNOSIS — K219 Gastro-esophageal reflux disease without esophagitis: Secondary | ICD-10-CM

## 2014-08-15 LAB — EKG, 12 LEAD, INITIAL
Atrial Rate: 110 {beats}/min
Calculated P Axis: 70 degrees
Calculated R Axis: 65 degrees
Calculated T Axis: 81 degrees
P-R Interval: 164 ms
Q-T Interval: 324 ms
QRS Duration: 92 ms
QTC Calculation (Bezet): 438 ms
Ventricular Rate: 110 {beats}/min

## 2014-08-15 MED ORDER — FAMOTIDINE 20 MG TAB
20 mg | ORAL_TABLET | Freq: Two times a day (BID) | ORAL | Status: AC
Start: 2014-08-15 — End: 2014-08-25

## 2014-08-15 MED ORDER — LIDOCAINE 2 % MUCOSAL SOLN
2 % | Status: AC
Start: 2014-08-15 — End: 2014-08-15
  Administered 2014-08-15: 10:00:00 via OROMUCOSAL

## 2014-08-15 MED ORDER — ALUM-MAG HYDROXIDE-SIMETH 200 MG-200 MG-20 MG/5 ML ORAL SUSP
200-200-20 mg/5 mL | ORAL | Status: AC
Start: 2014-08-15 — End: 2014-08-15
  Administered 2014-08-15: 10:00:00 via ORAL

## 2014-08-15 MED FILL — LIDOCAINE VISCOUS 2 % MUCOSAL SOLUTION: 2 % | Qty: 15

## 2014-08-15 MED FILL — MAG-AL PLUS 200 MG-200 MG-20 MG/5 ML ORAL SUSPENSION: 200-200-20 mg/5 mL | ORAL | Qty: 30

## 2014-08-15 NOTE — ED Provider Notes (Signed)
HPI Comments: Jeremy Walters is a 53 y.o. Male with a Hx chronic pancreatitis, Sz, alcoholism, HTN, GERD, EGD, colonoscopy who presents to the ED c/o sore throat. Pt states that he has acid reflux which causes him to have esophagus pain sometimes. Pt was drinking fluids tonight and then when trying to eat felt like he was choking on everything he tried. Pain is described as a burning "like acid reflux." Pt Some increased belching also incorporated. No medications tried PTA. No other complaints expressed at this time.     Patient is a 53 y.o. male presenting with sore throat.   Sore Throat   Pertinent negatives include no diarrhea, no vomiting, no congestion, no headaches and no shortness of breath.        Past Medical History   Diagnosis Date   ??? Pancreatitis chronic    ??? Seizure ([facility])    ??? Alcoholism ([facility])    ??? Seizures ([facility])    ??? Hypertension    ??? GERD (gastroesophageal reflux disease)         Past Surgical History   Procedure Laterality Date   ??? Hx gi       EGD   ??? Egd  07/09/2012         ??? Colonoscopy  07/09/2012               Family History   Problem Relation Age of Onset   ??? Hypertension Other         History     Social History   ??? Marital Status: SINGLE     Spouse Name: N/A     Number of Children: N/A   ??? Years of Education: N/A     Occupational History   ??? Not on file.     Social History Main Topics   ??? Smoking status: Current Every Day Smoker   ??? Smokeless tobacco: Not on file   ??? Alcohol Use: Yes      Comment: "I drink everyday"    ??? Drug Use: Not on file   ??? Sexual Activity: Not on file     Other Topics Concern   ??? Not on file     Social History Narrative            ALLERGIES: Morphine and Shellfish derived      Review of Systems   Constitutional: Negative for fever and chills.   HENT: Positive for sore throat. Negative for congestion.    Eyes: Negative for redness and visual disturbance.   Respiratory: Negative for shortness of breath.    Cardiovascular: Positive for chest pain.    Gastrointestinal: Negative for vomiting, abdominal pain and diarrhea.   Genitourinary: Negative for difficulty urinating.   Musculoskeletal: Negative for myalgias and arthralgias.   Skin: Negative for rash.   Neurological: Negative for headaches.   Psychiatric/Behavioral: Negative for dysphoric mood.   All other systems reviewed and are negative.      Filed Vitals:    08/15/14 0700 08/15/14 0710 08/15/14 0711 08/15/14 0714   BP: 145/81 148/85     Pulse: 90   90   Temp:       Resp: 16      Height:       Weight:       SpO2: 95% 89% 95% 95%            Physical Exam   Constitutional: He is oriented to person, place, and time. He appears well-developed and well-nourished. No  distress.   HENT:   Head: Normocephalic and atraumatic.   Mouth/Throat: Oropharynx is clear and moist.   Eyes: Conjunctivae are normal. Pupils are equal, round, and reactive to light. No scleral icterus.   Neck: Normal range of motion. Neck supple. No tracheal deviation present.   Cardiovascular: Normal rate, normal heart sounds and intact distal pulses.    Pulmonary/Chest: Effort normal and breath sounds normal. No respiratory distress. He has no wheezes.   Abdominal: Soft. Bowel sounds are normal. He exhibits no distension. There is tenderness (mild) in the epigastric area. There is no rebound and no guarding.   Musculoskeletal: Normal range of motion. He exhibits no edema.   Lymphadenopathy:     He has no cervical adenopathy.   Neurological: He is alert and oriented to person, place, and time. No cranial nerve deficit.   Skin: Skin is warm and dry. He is not diaphoretic.   Psychiatric: He has a normal mood and affect.   Nursing note and vitals reviewed.       MDM  Number of Diagnoses or Management Options  Gastroesophageal reflux disease without esophagitis:   Diagnosis management comments: gerd sx's    Gave mylanta and viscous lidocaine    Reassessed and feeling much better.      Discussed plan and f/u. Rx pepcid, fu given. Pt agrees and thankful.        Amount and/or Complexity of Data Reviewed  Tests in the medicine section of CPT??: ordered and reviewed    Risk of Complications, Morbidity, and/or Mortality  Presenting problems: low  Diagnostic procedures: low  Management options: low    Patient Progress  Patient progress: improved      Procedures    -------------------------------------------------------------------------------------------------------------------     RADIOLOGY RESULTS:   No orders to display       LABORATORY/EKG RESULTS:  Recent Results (from the past 12 hour(s))   EKG, 12 LEAD, INITIAL    Collection Time: 08/15/14  4:39 AM   Result Value Ref Range    Ventricular Rate 110 BPM    Atrial Rate 110 BPM    P-R Interval 164 ms    QRS Duration 92 ms    Q-T Interval 324 ms    QTC Calculation (Bezet) 438 ms    Calculated P Axis 70 degrees    Calculated R Axis 65 degrees    Calculated T Axis 81 degrees    Diagnosis       Sinus tachycardia  Moderate voltage criteria for LVH, may be normal variant  Borderline ECG  When compared with ECG of 08-Jun-2014 13:54,  No significant change was found         ED Objective Order Summary:     Orders Placed This Encounter   ??? EKG, 12 LEAD, INITIAL     Standing Status: Standing      Number of Occurrences: 1      Standing Expiration Date:      Order Specific Question:  Reason for Exam:     Answer:  chest pain   ??? alum-mag hydroxide-simeth (MYLANTA) oral suspension 30 mL     Sig:    ??? lidocaine (XYLOCAINE) 2 % viscous solution 15 mL     Sig:    ??? famotidine (PEPCID) 20 mg tablet     Sig: Take 1 Tab by mouth two (2) times a day for 10 days.     Dispense:  20 Tab     Refill:  0  CONSULTATIONS:      PROGRESS NOTES:  5:18 AM:  Dr. Ewing SchleinAdrian C Erlinda Solinger, DO answered the patient's questions regarding treatment.    DISPOSITION:  ED DIAGNOSIS & DISPOSITION INFORMATION  Diagnosis:   1. Gastroesophageal reflux disease without esophagitis           Disposition: discharged    Follow-up Information     Follow up With Details Comments Contact Info    Conception ChancySohaib K Malik Schedule an appointment as soon as possible for a visit in 2 days For primary care follow-up [facility]  [place] [id number]  [phone]      [facility] EM[facility]  As needed [address]  [place] [id number]  [phone]     Today            Discharge Medication List as of 08/15/2014  7:17 AM      START taking these medications    Details   famotidine (PEPCID) 20 mg tablet Take 1 Tab by mouth two (2) times a day for 10 days., Print, Disp-20 Tab, R-0         CONTINUE these medications which have NOT CHANGED    Details   phenytoin ER (DILANTIN) 100 mg ER capsule 100 mg., Historical Med      phenytoin (DILANTIN INFATABS) 50 mg tablet Take 4 tablets by mouth daily., Print, Disp-60 tablet, R-0      LORazepam (ATIVAN) 1 mg tablet Take 1-2 Tabs by mouth every six (6) hours as needed for 15 doses., Print, Disp-15 Tab, R-0             SCRIBE ATTESTATION STATEMENT:  Documented by: Edward QualiaKaitlyn Gavaza scribing for and in the presence of Ewing SchleinAdrian C Esmay Amspacher, DO.    PROVIDER ATTESTATION STATEMENT:  I personally performed the services described in the documentation, reviewed the documentation, as recorded by the scribe in my presence, and it accurately and completely records my words and actions Ewing SchleinAdrian C Karina Nofsinger, DO.

## 2014-08-15 NOTE — ED Notes (Signed)
Pt stated the pain was gone after he drank the medication.  He stated "I have a bus pass and the buses are running now".  Educated pt on alcohol and pancreatitis and esophagel complications.  Also educated him on fluid intake and spicy foods.  Pt walked around the ed, his gait was unremarkable.  Pt alert and oriented to person place and situation.  Pt was discharged with a prescription for pepcid and discharge papers, he is to follow up this week for a bronchoscopy.  pts armband removed and shredded.

## 2014-08-15 NOTE — ED Notes (Signed)
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  After eating I began to feel pain in my middle chest " (points to medial section of sternum just above manubrium).  Pt states it is not chest pain it is different, like the one [facility]. Pt is stable on cardiac and O2 monitor will continue to monitor.

## 2014-08-15 NOTE — ED Notes (Signed)
Starting at 10pm patient had chest pain and throat pain.  Chest pain resolved at this time but is continuing to have throat pain.

## 2014-09-26 DIAGNOSIS — R251 Tremor, unspecified: Secondary | ICD-10-CM

## 2014-09-26 HISTORY — DX: Tremor, unspecified: R25.1

## 2014-10-02 DIAGNOSIS — G40909 Epilepsy, unspecified, not intractable, without status epilepticus: Secondary | ICD-10-CM

## 2014-10-02 NOTE — ED Notes (Signed)
2 seizures today

## 2014-10-02 NOTE — ED Notes (Signed)
Patient states he has had two seizures today. States he forgot to take his dilantin today. [facility] and given prescription for the Dilantin on 09/29/14. States that prior to that he had been out of medication for a couple of weeks. Denies pain, denies SOB. Denies extremity weakness/numbness/tingling.

## 2014-10-02 NOTE — ED Provider Notes (Signed)
HPI Comments: Jeremy Walters is a 53 y.o. Male with a Hx of SZ and HTN who was brought in by EMS to the ED for 2 seizures today. Reports that he forgot to take his dilantin today, which he was given the prescription by [facility] on 09/29/14. Pt bit the left side of his tongue and reports lightheadedness. Denies focal weaknesses, injury, HA, fever, CP, SOB, vision changes, numbness or tingling. Pt is not followed by a neurologist. Has no other complaints or concerns at this time.    Patient is a 53 y.o. male presenting with seizures. The history is provided by the patient and the EMS personnel.   Seizure   The problem has been gradually worsening. Pertinent negatives include no headaches, no visual disturbance, no chest pain, no cough, no nausea, no vomiting and no diarrhea.   He reports no chest pain, no visual disturbance, no diarrhea, no vomiting, no headaches and no cough.        Past Medical History:   Diagnosis Date   ??? Pancreatitis chronic    ??? Seizure ([place])    ??? Alcoholism ([place])    ??? Seizures ([place])    ??? Hypertension    ??? GERD (gastroesophageal reflux disease)        Past Surgical History:   Procedure Laterality Date   ??? Hx gi       EGD   ??? Egd  07/09/2012         ??? Colonoscopy  07/09/2012               Family History:   Problem Relation Age of Onset   ??? Hypertension Other        History     Social History   ??? Marital Status: SINGLE     Spouse Name: N/A     Number of Children: N/A   ??? Years of Education: N/A     Occupational History   ??? Not on file.     Social History Main Topics   ??? Smoking status: Current Every Day Smoker   ??? Smokeless tobacco: Not on file   ??? Alcohol Use: Yes      Comment: "I drink everyday"    ??? Drug Use: Not on file   ??? Sexual Activity: Not on file     Other Topics Concern   ??? Not on file     Social History Narrative                ALLERGIES: Morphine and Shellfish derived      Review of Systems   Constitutional: Negative for fever and diaphoresis.   HENT: Negative for trouble swallowing.     Eyes: Negative for visual disturbance.   Respiratory: Negative for cough, chest tightness and shortness of breath.    Cardiovascular: Negative for chest pain.   Gastrointestinal: Negative for nausea, vomiting, abdominal pain and diarrhea.   Genitourinary: Negative for difficulty urinating.   Musculoskeletal: Negative for back pain and neck pain.   Skin: Negative for wound.   Neurological: Positive for seizures. Negative for dizziness, syncope, weakness, light-headedness, numbness and headaches.   Psychiatric/Behavioral: Negative for behavioral problems.   All other systems reviewed and are negative.      Filed Vitals:    10/02/14 2026   BP: 144/88   Pulse: 99   Temp: 99.7 ??F (37.6 ??C)   Resp: 18   Height: 5' 6"  (1.676 m)   Weight: 56.7 kg (125 lb)   SpO2:  97%            Physical Exam   Constitutional: He is oriented to person, place, and time. He appears well-developed and well-nourished. He is cooperative. No distress.   Well-appearing, nad   HENT:   Head: Normocephalic and atraumatic.   Mouth/Throat: Mucous membranes are normal.   Eyes: Conjunctivae, EOM and lids are normal.   Neck: Normal range of motion. Neck supple. No JVD present.   Cardiovascular: Normal rate, regular rhythm and normal heart sounds.    No murmur heard.  Pulmonary/Chest: Effort normal and breath sounds normal. No respiratory distress. He has no wheezes. He has no rhonchi. He has no rales.   Abdominal: Normal appearance. There is no tenderness.   Musculoskeletal: Normal range of motion. He exhibits no edema.   Normal Musculoskeletal appearance.   Neurological: He is alert and oriented to person, place, and time. No cranial nerve deficit. Coordination normal.   No focal deficits noted   Skin: Skin is warm and dry. He is not diaphoretic.   Psychiatric: His behavior is normal.   Nursing note and vitals reviewed.       MDM  Number of Diagnoses or Management Options  Seizure disorder [facility]):    Diagnosis management comments: 53 yo CM with PMHx seizures presents by EMS with seizure.  Pt reports noncompliance with dilantin, currently at baseline.  Examination unremarkable.  Will monitor in ED and evaluate for acute process.    1.  Cbc, cmp, dilantin level  2.  ua  3.  IVF  5.  Symptom management  6.  Re-evaluate    11:21 PM  Dilantin subtherapeutic, will replace.  Work-up otherwise unremarkable and pt has been doing well, asymptomatic in ED.  Discussed results and poc for dc home, follow-up, return precautions.      EKG    Date/Time: 10/02/2014 8:37 PM  Performed by: Jeani Hawking  Authorized by: Paulla Dolly S  Comparison: compared with previous ECG from 08/15/2014  Similar to previous ECG  Rhythm: sinus rhythm  Rate: normal  BPM: 96  QRS axis: normal  ST Segments: ST segments normal                                                          PROGRESS NOTES    2030:   Jeani Hawking, MD arrives to the bedside to evaluate the patient. Answered the patient's questions regarding the treatment plan.    2311:  I have reassessed the patient. I have discussed the workup, results and plan with the patient and patient is in agreement. Patient was discharged in stable condition. Patient was given outpatient follow up.  Patient is to return to emergency department if any new or worsening condition.       CONSULTATIONS  None    ED PHYSICIAN ORDERS  Orders Placed This Encounter   ??? CBC WITH AUTOMATED DIFF     Standing Status: Standing      Number of Occurrences: 1      Standing Expiration Date:    ??? METABOLIC PANEL, COMPREHENSIVE     Standing Status: Standing      Number of Occurrences: 1      Standing Expiration Date:    ??? URINALYSIS W/ RFLX MICROSCOPIC     Standing Status:  Standing      Number of Occurrences: 1      Standing Expiration Date:    ??? PHENYTOIN     Standing Status: Standing      Number of Occurrences: 1      Standing Expiration Date:    ??? CARDIAC MONITORING     Standing Status: Standing       Number of Occurrences: 1      Standing Expiration Date:      Order Specific Question:  Type:     Answer:  Bedside     Order Specific Question:  Off unit:     Answer:  w/out Tele or Nurse   ??? PULSE OXIMETRY CONTINUOUS     Standing Status: Standing      Number of Occurrences: 1      Standing Expiration Date:    ??? EKG NOTEWRITER(ASAP ONLY)     This order was created via procedure documentation     Standing Status: Standing      Number of Occurrences: 1      Standing Expiration Date:    ??? EKG, 12 LEAD, INITIAL     Standing Status: Standing      Number of Occurrences: 1      Standing Expiration Date:      Order Specific Question:  Reason for Exam:     Answer:  siezure   ??? sodium chloride 0.9 % bolus infusion 1,000 mL     Sig:    ??? potassium chloride (K-DUR, KLOR-CON) SR tablet 40 mEq     Sig:    ??? DISCONTD: phenytoin (DILANTIN) injection 500 mg     Sig:    ??? phenytoin (DILANTIN) 500 mg in 0.9% sodium chloride 100 mL IVPB     Sig:        MEDICATIONS ORDERED  Medications   phenytoin (DILANTIN) 500 mg in 0.9% sodium chloride 100 mL IVPB (not administered)   sodium chloride 0.9 % bolus infusion 1,000 mL (1,000 mL IntraVENous New Bag 10/02/14 2103)   potassium chloride (K-DUR, KLOR-CON) SR tablet 40 mEq (40 mEq Oral Given 10/02/14 2225)       RADIOLOGY INTERPRETATIONS  No orders to display       EKG READINGS/LABORATORY RESULTS  Recent Results (from the past 12 hour(s))   CBC WITH AUTOMATED DIFF    Collection Time: 10/02/14  8:30 PM   Result Value Ref Range    WBC 5.6 4.6 - 13.2 K/uL    RBC 3.93 (L) 4.70 - 5.50 M/uL    HGB 12.8 (L) 13.0 - 16.0 g/dL    HCT 37.0 36.0 - 48.0 %    MCV 94.1 74.0 - 97.0 FL    MCH 32.6 24.0 - 34.0 PG    MCHC 34.6 31.0 - 37.0 g/dL    RDW 12.4 11.6 - 14.5 %    PLATELET 322 135 - 420 K/uL    MPV 8.9 (L) 9.2 - 11.8 FL    NEUTROPHILS 60 40 - 73 %    LYMPHOCYTES 18 (L) 21 - 52 %    MONOCYTES 20 (H) 3 - 10 %    EOSINOPHILS 1 0 - 5 %    BASOPHILS 1 0 - 2 %    ABS. NEUTROPHILS 3.4 1.8 - 8.0 K/UL     ABS. LYMPHOCYTES 1.0 0.9 - 3.6 K/UL    ABS. MONOCYTES 1.1 0.05 - 1.2 K/UL    ABS. EOSINOPHILS 0.1 0.0 - 0.4 K/UL  ABS. BASOPHILS 0.1 (H) 0.0 - 0.06 K/UL    DF AUTOMATED     METABOLIC PANEL, COMPREHENSIVE    Collection Time: 10/02/14  8:30 PM   Result Value Ref Range    Sodium 140 136 - 145 mmol/L    Potassium 3.2 (L) 3.5 - 5.5 mmol/L    Chloride 101 100 - 108 mmol/L    CO2 23 21 - 32 mmol/L    Anion gap 16 3.0 - 18 mmol/L    Glucose 132 (H) 74 - 99 mg/dL    BUN 5 (L) 7.0 - 18 MG/DL    Creatinine 0.68 0.6 - 1.3 MG/DL    BUN/Creatinine ratio 7 (L) 12 - 20      GFR est AA >60 >60 ml/min/1.74m    GFR est non-AA >60 >60 ml/min/1.728m   Calcium 8.3 (L) 8.5 - 10.1 MG/DL    Bilirubin, total 0.3 0.2 - 1.0 MG/DL    ALT 51 16 - 61 U/L    AST 68 (H) 15 - 37 U/L    Alk. phosphatase 273 (H) 45 - 117 U/L    Protein, total 7.1 6.4 - 8.2 g/dL    Albumin 3.2 (L) 3.4 - 5.0 g/dL    Globulin 3.9 2.0 - 4.0 g/dL    A-G Ratio 0.8 0.8 - 1.7     PHENYTOIN    Collection Time: 10/02/14  8:30 PM   Result Value Ref Range    Phenytoin 1.7 (L) 10.0 - 20.0 ug/mL   EKG, 12 LEAD, INITIAL    Collection Time: 10/02/14  8:35 PM   Result Value Ref Range    Ventricular Rate 96 BPM    Atrial Rate 96 BPM    P-R Interval 164 ms    QRS Duration 92 ms    Q-T Interval 366 ms    QTC Calculation (Bezet) 462 ms    Calculated P Axis 56 degrees    Calculated R Axis 28 degrees    Calculated T Axis 66 degrees    Diagnosis       Normal sinus rhythm  Possible Left atrial enlargement  Left ventricular hypertrophy  Abnormal ECG  When compared with ECG of 15-Aug-2014 04:39,  No significant change was found     URINALYSIS W/ RFLX MICROSCOPIC    Collection Time: 10/02/14 10:26 PM   Result Value Ref Range    Color YELLOW      Appearance CLEAR      Specific gravity 1.025 1.003 - 1.030      pH (UA) 6.0 5.0 - 8.0      Protein NEGATIVE  NEG mg/dL    Glucose NEGATIVE  NEG mg/dL    Ketone NEGATIVE  NEG mg/dL    Bilirubin NEGATIVE  NEG      Blood NEGATIVE  NEG       Urobilinogen 0.2 0.2 - 1.0 EU/dL    Nitrites NEGATIVE  NEG      Leukocyte Esterase NEGATIVE  NEG         ED DIAGNOSIS & DISPOSITION INFORMATION  Diagnosis:   1. [facility]       Disposition: Discharged    Follow-up Information     Follow up With Details Comments Contact Info    [facility]  If symptoms worsen [address] Center[facility] needed for [facility] care [phone]  Patient's Medications   Start Taking    No medications on file   Continue Taking    LORAZEPAM (ATIVAN) 1 MG TABLET    Take 1-2 Tabs by mouth every six (6) hours as needed for 15 doses.    PHENYTOIN (DILANTIN INFATABS) 50 MG TABLET    Take 4 tablets by mouth daily.    PHENYTOIN ER (DILANTIN) 100 MG ER CAPSULE    100 mg three (3) times daily.   These Medications have changed    No medications on file   Stop Taking    No medications on file         SCRIBE ATTESTATION STATEMENT  Documented by: Leveda Anna, scribing for and in the presence of Jeani Hawking, MD. (2030)    Documented by: Alfredo Batty, scribing for and in the presence of Jeani Hawking, MD.    PROVIDER ATTESTATION STATEMENT  I personally performed the services described in the documentation, reviewed the documentation, as recorded by the scribe in my presence, and it accurately and completely records my words and actions.  Jeani Hawking, MD.

## 2014-10-03 ENCOUNTER — Inpatient Hospital Stay
Admit: 2014-10-03 | Discharge: 2014-10-03 | Disposition: A | Discharge status: Home / Self Care | Payer: Self-pay | Attending: Emergency Medicine

## 2014-10-03 LAB — CBC WITH AUTOMATED DIFF
ABS. BASOPHILS: 0.1 10*3/uL — ABNORMAL HIGH (ref 0.0–0.06)
ABS. EOSINOPHILS: 0.1 10*3/uL (ref 0.0–0.4)
ABS. LYMPHOCYTES: 1 10*3/uL (ref 0.9–3.6)
ABS. MONOCYTES: 1.1 10*3/uL (ref 0.05–1.2)
ABS. NEUTROPHILS: 3.4 10*3/uL (ref 1.8–8.0)
BASOPHILS: 1 % (ref 0–2)
EOSINOPHILS: 1 % (ref 0–5)
HCT: 37 % (ref 36.0–48.0)
HGB: 12.8 g/dL — ABNORMAL LOW (ref 13.0–16.0)
LYMPHOCYTES: 18 % — ABNORMAL LOW (ref 21–52)
MCH: 32.6 PG (ref 24.0–34.0)
MCHC: 34.6 g/dL (ref 31.0–37.0)
MCV: 94.1 FL (ref 74.0–97.0)
MONOCYTES: 20 % — ABNORMAL HIGH (ref 3–10)
MPV: 8.9 FL — ABNORMAL LOW (ref 9.2–11.8)
NEUTROPHILS: 60 % (ref 40–73)
PLATELET: 322 10*3/uL (ref 135–420)
RBC: 3.93 M/uL — ABNORMAL LOW (ref 4.70–5.50)
RDW: 12.4 % (ref 11.6–14.5)
WBC: 5.6 10*3/uL (ref 4.6–13.2)

## 2014-10-03 LAB — METABOLIC PANEL, COMPREHENSIVE
A-G Ratio: 0.8 (ref 0.8–1.7)
ALT (SGPT): 51 U/L (ref 16–61)
AST (SGOT): 68 U/L — ABNORMAL HIGH (ref 15–37)
Albumin: 3.2 g/dL — ABNORMAL LOW (ref 3.4–5.0)
Alk. phosphatase: 273 U/L — ABNORMAL HIGH (ref 45–117)
Anion gap: 16 mmol/L (ref 3.0–18)
BUN/Creatinine ratio: 7 — ABNORMAL LOW (ref 12–20)
BUN: 5 MG/DL — ABNORMAL LOW (ref 7.0–18)
Bilirubin, total: 0.3 MG/DL (ref 0.2–1.0)
CO2: 23 mmol/L (ref 21–32)
Calcium: 8.3 MG/DL — ABNORMAL LOW (ref 8.5–10.1)
Chloride: 101 mmol/L (ref 100–108)
Creatinine: 0.68 MG/DL (ref 0.6–1.3)
GFR est AA: >60 mL/min/{1.73_m2} (ref >60)
GFR est non-AA: >60 mL/min/{1.73_m2} (ref >60)
Globulin: 3.9 g/dL (ref 2.0–4.0)
Glucose: 132 mg/dL — ABNORMAL HIGH (ref 74–99)
Potassium: 3.2 mmol/L — ABNORMAL LOW (ref 3.5–5.5)
Protein, total: 7.1 g/dL (ref 6.4–8.2)
Sodium: 140 mmol/L (ref 136–145)

## 2014-10-03 LAB — URINALYSIS W/ RFLX MICROSCOPIC
Bilirubin: NEGATIVE
Blood: NEGATIVE
Glucose: NEGATIVE mg/dL
Ketone: NEGATIVE mg/dL
Leukocyte Esterase: NEGATIVE
Nitrites: NEGATIVE
Protein: NEGATIVE mg/dL
Specific gravity: 1.025 (ref 1.003–1.030)
Urobilinogen: 0.2 EU/dL (ref 0.2–1.0)
pH (UA): 6 (ref 5.0–8.0)

## 2014-10-03 LAB — PHENYTOIN: Phenytoin: 1.7 ug/mL — ABNORMAL LOW (ref 10.0–20.0)

## 2014-10-03 MED ORDER — PHENYTOIN SODIUM 50 MG/ML IV
50 mg/mL | Freq: Once | INTRAVENOUS | Status: DC
Start: 2014-10-03 — End: 2014-10-02

## 2014-10-03 MED ORDER — SODIUM CHLORIDE 0.9% BOLUS IV
0.9 % | Freq: Once | INTRAVENOUS | Status: AC
Start: 2014-10-03 — End: 2014-10-02
  Administered 2014-10-03: 02:00:00 via INTRAVENOUS

## 2014-10-03 MED ORDER — POTASSIUM CHLORIDE SR 20 MEQ TAB, PARTICLES/CRYSTALS
20 mEq | ORAL | Status: AC
Start: 2014-10-03 — End: 2014-10-02
  Administered 2014-10-03: 03:00:00 via ORAL

## 2014-10-03 MED ORDER — PHENYTOIN SODIUM 50 MG/ML IV
50 mg/mL | INTRAVENOUS | Status: AC
Start: 2014-10-03 — End: 2014-10-02
  Administered 2014-10-03: 04:00:00 via INTRAVENOUS

## 2014-10-03 MED FILL — SODIUM CHLORIDE 0.9 % IV: INTRAVENOUS | Qty: 1000

## 2014-10-03 MED FILL — KLOR-CON M20 MEQ TABLET,EXTENDED RELEASE: 20 mEq | ORAL | Qty: 2

## 2014-10-03 MED FILL — PHENYTOIN SODIUM 50 MG/ML IV: 50 mg/mL | INTRAVENOUS | Qty: 10

## 2014-10-03 NOTE — ED Notes (Signed)
I have reviewed discharge instructions with the patient.  The patient verbalized understanding.

## 2014-10-04 LAB — EKG, 12 LEAD, INITIAL
Atrial Rate: 96 {beats}/min
Calculated P Axis: 56 degrees
Calculated R Axis: 28 degrees
Calculated T Axis: 66 degrees
Diagnosis: NORMAL
P-R Interval: 164 ms
Q-T Interval: 366 ms
QRS Duration: 92 ms
QTC Calculation (Bezet): 462 ms
Ventricular Rate: 96 {beats}/min

## 2014-11-11 ENCOUNTER — Inpatient Hospital Stay: Admit: 2014-11-11 | Discharge: 2014-11-11 | Payer: Self-pay | Attending: Emergency Medicine

## 2014-11-11 DIAGNOSIS — R0789 Other chest pain: Secondary | ICD-10-CM

## 2014-11-11 LAB — POC CHEM8
Anion gap, POC: 22 — ABNORMAL HIGH (ref 10–20)
BUN, POC: 8 MG/DL (ref 7–18)
CO2, POC: 21 MMOL/L (ref 19–24)
Calcium, ionized (POC): 1.02 MMOL/L — ABNORMAL LOW (ref 1.12–1.32)
Chloride, POC: 98 MMOL/L — ABNORMAL LOW (ref 100–108)
Creatinine, POC: 1 MG/DL (ref 0.6–1.3)
GFRAA, POC: >60 mL/min/{1.73_m2} (ref >60)
GFRNA, POC: >60 mL/min/{1.73_m2} (ref >60)
Glucose, POC: 135 MG/DL — ABNORMAL HIGH (ref 74–106)
Hematocrit, POC: 40 % (ref 37.0–49.0)
Hemoglobin, POC: 13.6 G/DL (ref 13.0–16.0)
Potassium, POC: 3.9 MMOL/L (ref 3.5–5.5)
Sodium, POC: 136 MMOL/L (ref 136–145)

## 2014-11-11 LAB — POC TROPONIN-I: Troponin-I (POC): <0.04 ng/mL (ref 0.00–0.08)

## 2014-11-11 NOTE — ED Notes (Signed)
I have reviewed discharge instructions with the patient.  The patient verbalized understanding.

## 2014-11-11 NOTE — ED Provider Notes (Signed)
HPI Comments: 1:55 PM 11/11/2014    Jeremy Walters is a 54 y.o. male h/o pancreatitis, seizure, HTN, GERD, and alcoholism with a who presents to the ED via correctional officers for evaluation d/t alcohol intoxication.  Police officers picked pt up as he was using EtOH publicly.  Pt denies illicit drug use today.  Pt reports associated sx including CP, abd pain, and weight loss.  Limited hx as pt appears intoxicated.      PCP: None      The history is provided by the patient (police). No language interpreter was used.        Past Medical History:   Diagnosis Date   ??? Pancreatitis chronic    ??? Seizure ([facility])    ??? Alcoholism ([facility])    ??? Seizures ([facility])    ??? Hypertension    ??? GERD (gastroesophageal reflux disease)        Past Surgical History:   Procedure Laterality Date   ??? Hx gi       EGD   ??? Egd  07/09/2012         ??? Colonoscopy  07/09/2012               Family History:   Problem Relation Age of Onset   ??? Hypertension Other        History     Social History   ??? Marital Status: SINGLE     Spouse Name: N/A     Number of Children: N/A   ??? Years of Education: N/A     Occupational History   ??? Not on file.     Social History Main Topics   ??? Smoking status: Current Every Day Smoker   ??? Smokeless tobacco: Not on file   ??? Alcohol Use: Yes      Comment: "I drink everyday"    ??? Drug Use: Not on file   ??? Sexual Activity: Not on file     Other Topics Concern   ??? Not on file     Social History Narrative                ALLERGIES: Morphine and Shellfish derived      Review of Systems   Unable to perform ROS  Constitutional: Positive for unexpected weight change (loss). Negative for fever, chills, diaphoresis, appetite change and fatigue.   HENT: Negative for congestion, dental problem, ear discharge, ear pain, hearing loss, nosebleeds, rhinorrhea, sinus pressure, sore throat, tinnitus, trouble swallowing and voice change.    Eyes: Negative for photophobia, pain, discharge, redness and visual disturbance.    Respiratory: Negative for cough, choking, chest tightness, shortness of breath, wheezing and stridor.    Cardiovascular: Positive for chest pain. Negative for palpitations and leg swelling.   Gastrointestinal: Positive for abdominal pain. Negative for nausea, vomiting, diarrhea, constipation, blood in stool, abdominal distention and anal bleeding.   Genitourinary: Negative for dysuria, urgency, frequency, hematuria, flank pain, decreased urine volume, discharge, penile swelling, scrotal swelling, difficulty urinating, genital sores, penile pain and testicular pain.   Musculoskeletal: Negative for neck pain and neck stiffness.   Neurological: Negative for tremors, seizures, syncope, speech difficulty, weakness, light-headedness and headaches.   Hematological: Negative for adenopathy. Does not bruise/bleed easily.   Psychiatric/Behavioral: Negative for suicidal ideas, hallucinations, behavioral problems, confusion, sleep disturbance, self-injury and agitation. The patient is not nervous/anxious and is not hyperactive.        Filed Vitals:    11/11/14 1420   BP: 103/70  Pulse: 95   Temp: 98 ??F (36.7 ??C)   Resp: 16   Height:  (1.676 m)   Weight: 52.164 kg (115 lb)   SpO2: 98%            Physical Exam   Constitutional: He is oriented to person, place, and time. He appears well-developed and well-nourished. No distress.   54 year old Disheveled male in no acute distress.   HENT:   Head: Normocephalic and atraumatic.   Right Ear: External ear normal.   Left Ear: External ear normal.   Nose: Nose normal.   Mouth/Throat: Oropharynx is clear and moist. No oropharyngeal exudate.   Eyes: Conjunctivae and EOM are normal. Pupils are equal, round, and reactive to light. Right eye exhibits no discharge. Left eye exhibits no discharge. No scleral icterus.   Neck: Normal range of motion. Neck supple. No JVD present. No tracheal deviation present. No thyromegaly present.    Cardiovascular: Normal rate, regular rhythm, normal heart sounds and intact distal pulses.  Exam reveals no gallop and no friction rub.    No murmur heard.  Pulmonary/Chest: Effort normal and breath sounds normal. No stridor. No respiratory distress. He has no wheezes. He has no rales. He exhibits no tenderness.   Abdominal: Soft. Bowel sounds are normal. He exhibits no distension and no mass. There is no tenderness. There is no rebound and no guarding.   Musculoskeletal: Normal range of motion. He exhibits no edema or tenderness.   Lymphadenopathy:     He has no cervical adenopathy.   Neurological: He is alert and oriented to person, place, and time. No cranial nerve deficit. He exhibits normal muscle tone. Coordination normal.   Skin: Skin is warm and dry. No rash noted. He is not diaphoretic. No erythema. No pallor.   Psychiatric: He has a normal mood and affect. His behavior is normal. Judgment and thought content normal.   Nursing note and vitals reviewed.       MDM  Number of Diagnoses or Management Options  Alcoholism /alcohol abuse ([facility]CC):   Atypical chest pain:   Diagnosis management comments: Patient complains of chest pain.  He arrived in the ED with ETOH on breath in police custody.  Patient able to ambulate without assistance.         Amount and/or Complexity of Data Reviewed  Clinical lab tests: ordered and reviewed  Tests in the medicine section of CPT??: ordered and reviewed  Decide to obtain previous medical records or to obtain history from someone other than the patient: yes  Obtain history from someone other than the patient: yes  Review and summarize past medical records: yes  Independent visualization of images, tracings, or specimens: yes    Risk of Complications, Morbidity, and/or Mortality  Presenting problems: moderate  Diagnostic procedures: moderate  Management options: moderate    Patient Progress  Patient progress: stable      Procedures     -------------------------------------------------------------------------------------------------------------------  PROGRESS NOTE:    2:07 PM: Interviewed pt and officers and examined pt.  Informed pt of plan to order EKG and labs, then reassess.      2:42 PM: Pt has ambulated on own.  Cleared for d/c in police custody with instructions to f/u at [facility].      ORDERS:  Orders Placed This Encounter   ??? POC CHEM8   ??? POC TROPONIN-I   ??? POC CHEM8   ??? POC TROPONIN-I   ??? EKG, 12 LEAD, INITIAL  EKG INTERPRETATIONS:  ED EKG interpretation:  Rhythm: normal sinus rhythm; and regular . Rate (approx.): 900; Axis: normal; ; QRS interval: normal ; ST/T wave: no acute ST/T wave changes; Other findings:LVH, early repolarization. This EKG was interpreted by [facility] HealthJESSICA JACKSON,ED Provider.      LAB RESULTS:  Recent Results (from the past 12 hour(s))   POC CHEM8    Collection Time: 11/11/14  2:25 PM   Result Value Ref Range    CO2 (POC) 21 19 - 24 MMOL/L    Glucose (POC) 135 (H) 74 - 106 MG/DL    BUN (POC) 8 7 - 18 MG/DL    Creatinine (POC) 1.0 0.6 - 1.3 MG/DL    GFR-AA (POC) >09>60 >81>60 ml/min/1.7373m2    GFR, non-AA (POC) >60 >60 ml/min/1.2773m2    Sodium (POC) 136 136 - 145 MMOL/L    Potassium (POC) 3.9 3.5 - 5.5 MMOL/L    Calcium, ionized (POC) 1.02 (L) 1.12 - 1.32 MMOL/L    Chloride (POC) 98 (L) 100 - 108 MMOL/L    Anion gap (POC) 22 (H) 10 - 20      Hematocrit (POC) 40 37.0 - 49.0 %    Hemoglobin (POC) 13.6 13.0 - 16.0 G/DL   POC TROPONIN-I    Collection Time: 11/11/14  2:28 PM   Result Value Ref Range    Troponin-I (POC) <0.04 0.00 - 0.08 ng/mL        DISPOSITION:  Diagnosis:   1. Atypical chest pain    2. Alcoholism /alcohol abuse (HCC)          Disposition: discharged in stable condition    Follow-up Information     Follow up With Details Comments Contact Info    [facility]  [address]  [place] [id number]  [phone]          Patient's Medications   Start Taking     No medications on file   Continue Taking    LORAZEPAM (ATIVAN) 1 MG TABLET    Take 1-2 Tabs by mouth every six (6) hours as needed for 15 doses.    PHENYTOIN (DILANTIN INFATABS) 50 MG TABLET    Take 4 tablets by mouth daily.    PHENYTOIN ER (DILANTIN) 100 MG ER CAPSULE    100 mg three (3) times daily.   These Medications have changed    No medications on file   Stop Taking    No medications on file        -------------------------------------------------------------------------------------------------------------------    I, Stacy GardnerJessica Jackson, have documented this note as a scribe for Dr. Llana Alimentlarence Bronsen Serano, D.O.       Provider Attestation:  I personally performed the services described in the documentation, reviewed the documentation, as recorded by the scribe in my presence, and it accurately and completely records my words and actions.  Dr. Venancio Poissonlarence G. Kasem Mozer D.O. (2:07 PM).

## 2014-11-11 NOTE — ED Notes (Signed)
Pt reports chest pain and cough times 3 months.  Pt arrived via PD in custody.

## 2014-11-19 LAB — EKG, 12 LEAD, INITIAL
Calculated P Axis: 46 degrees
Calculated R Axis: 28 degrees
Calculated T Axis: 58 degrees
P-R Interval: 156 ms
Q-T Interval: 350 ms
QRS Duration: 90 ms
QTC Calculation (Bezet): 429 ms
Ventricular Rate: 90 {beats}/min

## 2016-01-08 ENCOUNTER — Inpatient Hospital Stay
Admit: 2016-01-08 | Discharge: 2016-01-09 | Disposition: A | Discharge status: Home / Self Care | Payer: Self-pay | Attending: Emergency Medicine

## 2016-01-08 ENCOUNTER — Emergency Department: Admit: 2016-01-09 | Payer: Self-pay | Primary: Internal Medicine

## 2016-01-08 ENCOUNTER — Emergency Department: Payer: Self-pay | Primary: Internal Medicine

## 2016-01-08 DIAGNOSIS — F10229 Alcohol dependence with intoxication, unspecified: Secondary | ICD-10-CM

## 2016-01-08 MED ORDER — SODIUM CHLORIDE 0.9% BOLUS IV
0.9 % | Freq: Once | INTRAVENOUS | Status: AC
Start: 2016-01-08 — End: 2016-01-09

## 2016-01-08 MED ORDER — ONDANSETRON (PF) 4 MG/2 ML INJECTION
4 mg/2 mL | Freq: Once | INTRAMUSCULAR | Status: AC
Start: 2016-01-08 — End: 2016-01-08
  Administered 2016-01-09: via INTRAVENOUS

## 2016-01-08 MED FILL — SODIUM CHLORIDE 0.9 % IV: INTRAVENOUS | Qty: 1000
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
## 2016-01-08 NOTE — ED Notes (Signed)
Shift Change report received from Jessica B RN

## 2016-01-09 LAB — METABOLIC PANEL, COMPREHENSIVE
A-G Ratio: 1.1 (ref 0.8–1.7)
ALT (SGPT): 81 U/L — ABNORMAL HIGH (ref 16–61)
AST (SGOT): 166 U/L — ABNORMAL HIGH (ref 15–37)
Albumin: 3.9 g/dL (ref 3.4–5.0)
Alk. phosphatase: 219 U/L — ABNORMAL HIGH (ref 45–117)
Anion gap: 13 mmol/L (ref 3.0–18)
BUN/Creatinine ratio: 11 — ABNORMAL LOW (ref 12–20)
BUN: 6 MG/DL — ABNORMAL LOW (ref 7.0–18)
Bilirubin, total: 0.9 MG/DL (ref 0.2–1.0)
CO2: 26 mmol/L (ref 21–32)
Calcium: 8.2 MG/DL — ABNORMAL LOW (ref 8.5–10.1)
Chloride: 102 mmol/L (ref 100–108)
Creatinine: 0.54 MG/DL — ABNORMAL LOW (ref 0.6–1.3)
GFR est AA: >60 mL/min/{1.73_m2} (ref >60)
GFR est non-AA: >60 mL/min/{1.73_m2} (ref >60)
Globulin: 3.4 g/dL (ref 2.0–4.0)
Glucose: 93 mg/dL (ref 74–99)
Potassium: 4 mmol/L (ref 3.5–5.5)
Protein, total: 7.3 g/dL (ref 6.4–8.2)
Sodium: 141 mmol/L (ref 136–145)

## 2016-01-09 LAB — CBC WITH AUTOMATED DIFF
ABS. BASOPHILS: 0 10*3/uL (ref 0.0–0.06)
ABS. EOSINOPHILS: 0 10*3/uL (ref 0.0–0.4)
ABS. LYMPHOCYTES: 2.5 10*3/uL (ref 0.9–3.6)
ABS. MONOCYTES: 0.5 10*3/uL (ref 0.05–1.2)
ABS. NEUTROPHILS: 2.5 10*3/uL (ref 1.8–8.0)
BASOPHILS: 1 % (ref 0–2)
EOSINOPHILS: 1 % (ref 0–5)
HCT: 40 % (ref 36.0–48.0)
HGB: 14 g/dL (ref 13.0–16.0)
LYMPHOCYTES: 45 % (ref 21–52)
MCH: 32.5 PG (ref 24.0–34.0)
MCHC: 35 g/dL (ref 31.0–37.0)
MCV: 92.8 FL (ref 74.0–97.0)
MONOCYTES: 9 % (ref 3–10)
MPV: 9.7 FL (ref 9.2–11.8)
NEUTROPHILS: 44 % (ref 40–73)
PLATELET: 93 10*3/uL — ABNORMAL LOW (ref 135–420)
RBC: 4.31 M/uL — ABNORMAL LOW (ref 4.70–5.50)
RDW: 13.3 % (ref 11.6–14.5)
WBC: 5.6 10*3/uL (ref 4.6–13.2)

## 2016-01-09 LAB — URINALYSIS W/ RFLX MICROSCOPIC
Bilirubin: NEGATIVE
Blood: NEGATIVE
Glucose: NEGATIVE mg/dL
Ketone: NEGATIVE mg/dL
Nitrites: NEGATIVE
Protein: NEGATIVE mg/dL
Specific gravity: 1.01 (ref 1.005–1.030)
Urobilinogen: 1 EU/dL (ref 0.2–1.0)
pH (UA): 5.5 (ref 5.0–8.0)

## 2016-01-09 LAB — ETHYL ALCOHOL
ALCOHOL(ETHYL),SERUM: 413 MG/DL — CR (ref 0–3)
ALCOHOL(ETHYL),SERUM: 175 MG/DL — ABNORMAL HIGH (ref 0–3)

## 2016-01-09 LAB — URINE MICROSCOPIC ONLY: WBC: >100 /hpf — ABNORMAL HIGH (ref 0–4)

## 2016-01-09 LAB — LIPASE: Lipase: 95 U/L (ref 73–393)

## 2016-01-09 LAB — PHENYTOIN: Phenytoin: <0.4 ug/mL — ABNORMAL LOW (ref 10.0–20.0)

## 2016-01-09 MED ORDER — CEPHALEXIN 250 MG CAP
250 mg | ORAL | Status: AC
Start: 2016-01-09 — End: 2016-01-09
  Administered 2016-01-09: 05:00:00 via ORAL

## 2016-01-09 MED ORDER — IOVERSOL 320 MG/ML IV SYRINGE
320 mg iodine/mL | Freq: Once | INTRAVENOUS | Status: DC
Start: 2016-01-09 — End: 2016-01-09

## 2016-01-09 MED ORDER — CEPHALEXIN 500 MG CAP
500 mg | ORAL_CAPSULE | Freq: Two times a day (BID) | ORAL | 0 refills | Status: AC
Start: 2016-01-09 — End: 2016-01-16

## 2016-01-09 MED ORDER — SODIUM CHLORIDE 0.9% BOLUS IV
0.9 % | Freq: Once | INTRAVENOUS | Status: AC
Start: 2016-01-09 — End: 2016-01-09
  Administered 2016-01-09: 07:00:00 via INTRAVENOUS

## 2016-01-09 MED FILL — ONDANSETRON (PF) 4 MG/2 ML INJECTION: 4 mg/2 mL | INTRAMUSCULAR | Qty: 2

## 2016-01-09 MED FILL — CEPHALEXIN 250 MG CAP: 250 mg | ORAL | Qty: 2

## 2016-01-09 MED FILL — SODIUM CHLORIDE 0.9 % IV: INTRAVENOUS | Qty: 1000

## 2016-01-09 MED FILL — OPTIRAY 320 MG IODINE/ML INTRAVENOUS SYRINGE: 320 mg iodine/mL | INTRAVENOUS | Qty: 125

## 2016-01-09 NOTE — ED Notes (Signed)
Cab voucher called for patient

## 2016-01-09 NOTE — ED Notes (Signed)
I have reviewed discharge instructions with the patient.  The patient verbalized understanding.    Patient armband removed and shredded

## 2016-06-13 ENCOUNTER — Inpatient Hospital Stay
Admit: 2016-06-13 | Discharge: 2016-06-13 | Disposition: A | Discharge status: Home / Self Care | Payer: Self-pay | Attending: Emergency Medicine

## 2016-06-13 DIAGNOSIS — Z760 Encounter for issue of repeat prescription: Secondary | ICD-10-CM

## 2016-06-13 MED ORDER — PHENYTOIN 50 MG CHEWABLE TAB
50 mg | ORAL_TABLET | Freq: Every day | ORAL | 0 refills | Status: DC
Start: 2016-06-13 — End: 2016-08-21

## 2016-06-13 NOTE — ED Notes (Signed)
Discharge instructions given to patient, patient verbalizes understanding of instructions. Patient alert and oriented x3, denies pain or shortness of breath at this time. Patient ambulatory, gait steady. Patient accompanied by police.  Patient armband removed and given to patient to take home.  Patient was informed of the privacy risks if armband lost or stolen.

## 2016-06-13 NOTE — ED Triage Notes (Signed)
Patient states,"I just want to be seen for my seizures. I need medication refill."

## 2016-06-13 NOTE — ED Provider Notes (Addendum)
Patient is a 55 y.o. male presenting with medication refill. The history is provided by the patient.   Medication Refill        Jeremy Walters is a 55 y.o. male history of Seizures, GERD, pancreatitis presents with c/o needs refill of his Depakote. States he is being arrested at this time and has no complaints. States just needs a Rx for his medication before being taken away in Police Custody. Police are here with him.  Past Medical History:   Diagnosis Date   ??? Alcoholism ([facility])    ??? GERD (gastroesophageal reflux disease)    ??? Hypertension    ??? Pancreatitis chronic    ??? Seizure ([facility])    ??? Seizures ([facility])        Past Surgical History:   Procedure Laterality Date   ??? COLONOSCOPY  07/09/2012        ??? EGD  07/09/2012        ??? HX GI      EGD         Family History:   Problem Relation Age of Onset   ??? Hypertension Other        Social History     Social History   ??? Marital status: SINGLE     Spouse name: N/A   ??? Number of children: N/A   ??? Years of education: N/A     Occupational History   ??? Not on file.     Social History Main Topics   ??? Smoking status: Current Every Day Smoker   ??? Smokeless tobacco: Not on file   ??? Alcohol use Yes      Comment: "I drink everyday"    ??? Drug use: Not on file   ??? Sexual activity: Not on file     Other Topics Concern   ??? Not on file     Social History Narrative         ALLERGIES: Morphine and Shellfish derived    Review of Systems  Constitutional:  Denies malaise, fever, chills.   Head:  Denies injury.   Face:  Denies injury or pain.   ENMT:  Denies sore throat.   Neck:  Denies injury or pain.   Chest:  Denies injury.   Cardiac:  Denies chest pain or palpitations.   Respiratory:  Denies cough, wheezing, difficulty breathing, shortness of breath.   GI/ABD:  Denies injury, pain, distention, nausea, vomiting, diarrhea.   GU:  Denies injury, pain, dysuria or urgency.   Back:  Denies injury or pain.   Pelvis:  Denies injury or pain.   Extremity/MS:  Denies injury or pain.    Neuro:  Denies headache, LOC, dizziness, neurologic symptoms/deficits/paresthesias.   Skin: Denies injury, rash, itching or skin changes.    Vitals:    06/13/16 1747   BP: 140/89   Pulse: (!) 124   Resp: 14   Temp: 98.2 ??F (36.8 ??C)   SpO2: 100%            Physical Exam   Nursing note and vitals reviewed.  CONSTITUTIONAL: Alert, in no apparent distress; well-developed; well-nourished.   HEAD:  Normocephalic, atraumatic.   EYES: PERRL; EOM's intact.   ENTM: Nose: No rhinorrhea; Throat: mucous membranes moist. Posterior pharynx-normal.  Neck:  No JVD, supple without lymphadenopathy.  RESP: Chest clear, equal breath sounds.   CV: S1 and S2 WNL; No murmurs, gallops or rubs.   GI: Abdomen soft and non-tender. No masses or organomegaly.   UPPER  EXT:  Normal inspection.   LOWER EXT: Normal inspection.  NEURO: strength 5/5 and sym, sensation intact.   SKIN: No rashes; Normal for age and stage.   PSYCH:  Alert and oriented, normal affect.       MDM  Number of Diagnoses or Management Options  Medication refill:   Diagnosis management comments: IMPRESSION AND MEDICAL DECISION MAKING:  Based upon the patient's presentation with noted HPI and PE, along with the work up done in the emergency department, I believe that the patient has no complaints other than needing his medication refill. Will give short course and advised will have follow up while incarcerated.  The patient will be discharged home.  Warning signs of worsening condition were discussed and understood by the patient. Based on patient's age, coexisting illness, exam, and the results of this ED evaluation, the decision to treat as an outpatient was made. Based on the information available at time of discharge, acute pathology requiring immediate intervention was deemed relative unlikely. While it is impossible to completely exclude the possibility of underlying serious disease or worsening of condition, I feel the relative likelihood is extremely low. I  discussed this uncertainty with the patient, who understood ED evaluation and treatment and felt comfortable with the outpatient treatment plan. All questions regarding care, test results, and follow up were answered. The patient is stable and appropriate to discharge. They understand that they should return to the emergency department for any new or worsening symptoms. I stressed the importance of follow up for repeat assessment and possibly further evaluation/treatment.        ED Course       Procedures        Vitals:  Patient Vitals for the past 12 hrs:   Temp Pulse Resp BP SpO2   06/13/16 1747 98.2 ??F (36.8 ??C) (!) 124 14 140/89 100 %         Medications ordered:   Medications - No data to display      Lab findings:  No results found for this or any previous visit (from the past 12 hour(s)).    EKG interpretation by ED Physician:      X-Ray, CT or other radiology findings or impressions:  No orders to display       Progress notes, Consult notes or additional Procedure notes:       Disposition:  Diagnosis:   1. Medication refill        Disposition:   6:41 PM  Pt reevaluated at this time and is resting comfortably in NAD. Discussed results and findings, as well as, diagnosis and plan for discharge. Pt verbalizes understanding and agreement with plan. All questions addressed at this time.     Follow-up Information     Follow up With Details Comments Contact Info    [facility] Schedule an appointment as soon as possible for a visit in 2 days  [address]  [place] [address]  [phone]    [facility]  EMERGENCY DEPT  If symptoms worsen [address] [place]  [place] [address]  [phone]           Patient's Medications   Start Taking    No medications on file   Continue Taking    LORAZEPAM (ATIVAN) 1 MG TABLET    Take 1-2 Tabs by mouth every six (6) hours as needed for 15 doses.    PHENYTOIN ER (DILANTIN) 100 MG ER CAPSULE    100 mg three (3) times  daily.   These Medications have changed     Modified Medication Previous Medication    PHENYTOIN (DILANTIN INFATABS) 50 MG CHEWABLE TABLET phenytoin (DILANTIN INFATABS) 50 mg tablet       Take 4 Tabs by mouth daily.    Take 4 tablets by mouth daily.   Stop Taking    No medications on file

## 2016-06-14 DIAGNOSIS — H1131 Conjunctival hemorrhage, right eye: Secondary | ICD-10-CM

## 2016-06-14 HISTORY — DX: Conjunctival hemorrhage, right eye: H11.31

## 2016-08-13 ENCOUNTER — Inpatient Hospital Stay
Admit: 2016-08-13 | Discharge: 2016-08-14 | Disposition: A | Discharge status: Home / Self Care | Payer: Self-pay | Attending: Emergency Medicine

## 2016-08-13 DIAGNOSIS — K861 Other chronic pancreatitis: Secondary | ICD-10-CM

## 2016-08-13 LAB — CBC WITH AUTOMATED DIFF
ABS. BASOPHILS: 0.1 10*3/uL — ABNORMAL HIGH (ref 0.0–0.06)
ABS. EOSINOPHILS: 0.2 10*3/uL (ref 0.0–0.4)
ABS. LYMPHOCYTES: 3.4 10*3/uL (ref 0.9–3.6)
ABS. MONOCYTES: 0.5 10*3/uL (ref 0.05–1.2)
ABS. NEUTROPHILS: 2.5 10*3/uL (ref 1.8–8.0)
BASOPHILS: 1 % (ref 0–2)
EOSINOPHILS: 3 % (ref 0–5)
HCT: 31.4 % — ABNORMAL LOW (ref 36.0–48.0)
HGB: 10.9 g/dL — ABNORMAL LOW (ref 13.0–16.0)
LYMPHOCYTES: 50 % (ref 21–52)
MCH: 30.9 PG (ref 24.0–34.0)
MCHC: 34.7 g/dL (ref 31.0–37.0)
MCV: 89 FL (ref 74.0–97.0)
MONOCYTES: 8 % (ref 3–10)
MPV: 8.5 FL — ABNORMAL LOW (ref 9.2–11.8)
NEUTROPHILS: 38 % — ABNORMAL LOW (ref 40–73)
PLATELET: 317 10*3/uL (ref 135–420)
RBC: 3.53 M/uL — ABNORMAL LOW (ref 4.70–5.50)
RDW: 13.8 % (ref 11.6–14.5)
WBC: 6.7 10*3/uL (ref 4.6–13.2)

## 2016-08-13 LAB — METABOLIC PANEL, BASIC
Anion gap: 12 mmol/L (ref 3.0–18)
BUN/Creatinine ratio: 5 — ABNORMAL LOW (ref 12–20)
BUN: 2 MG/DL — ABNORMAL LOW (ref 7.0–18)
CO2: 27 mmol/L (ref 21–32)
Calcium: 7.7 MG/DL — ABNORMAL LOW (ref 8.5–10.1)
Chloride: 107 mmol/L (ref 100–108)
Creatinine: 0.43 MG/DL — ABNORMAL LOW (ref 0.6–1.3)
GFR est AA: >60 mL/min/{1.73_m2} (ref >60)
GFR est non-AA: >60 mL/min/{1.73_m2} (ref >60)
Glucose: 104 mg/dL — ABNORMAL HIGH (ref 74–99)
Potassium: 3.8 mmol/L (ref 3.5–5.5)
Sodium: 146 mmol/L — ABNORMAL HIGH (ref 136–145)

## 2016-08-13 LAB — LIPASE: Lipase: 479 U/L — ABNORMAL HIGH (ref 73–393)

## 2016-08-13 NOTE — ED Notes (Signed)
Patient first stated that he was having difficulty breathing, was able to talk in complete sentences and had no respiratory distress in triage. When questioned again the patient states he has a rash and pulls up his sleeve, red rash noted on R arm. Patient then asks to be checked for his pancreatitis but has no abdominal pain. Patient states he was just released from jail on 10/3.

## 2016-08-13 NOTE — ED Notes (Signed)
Very confusing historian - ultimately concerned for bed bugs and chronic pancreatitis

## 2016-08-13 NOTE — ED Notes (Signed)
I have reviewed discharge instruction and prescriptions with patient.  Patient verbalized understanding and has no further questions at this time.  Education taught and patient verbalized understanding of education.  Teach back method used.     Patients pain 0/10.  Belongings given to patient.  Patient discharged with himself to home.

## 2016-08-13 NOTE — ED Triage Notes (Signed)
"  I think I am infested with bed bugs, I was just released from jail on the third"

## 2016-08-13 NOTE — ED Provider Notes (Signed)
HPI Comments: 6:37 PM Jeremy Walters is a 55 y.o. male with h/o pancreatitis, Seizures, and HTN who presents to ED complaining of a rash on his right arm onset several weeks ago. Patient says he just got out of [facility] jail on October 3rd. He has been in jail for a few months. He says that he thinks he "got bed bugs while I was in there". Admits to drinking 1 beer today. He sees Dr. Wynell Balloon for his pancreatitis. Patient wants some blood test done to check his pancreas. He says he is concerned because "my pain started again while I was locked up in [facility] jail". Last did drugs 3.5 months ago. Denies abd pain at this moment.  No other concerns or symptoms at this time.      PCP: None      The history is provided by the patient.        Past Medical History:   Diagnosis Date   ??? Alcoholism ([place])    ??? GERD (gastroesophageal reflux disease)    ??? Hypertension    ??? Pancreatitis chronic    ??? Seizure ([place])    ??? Seizures ([place])        Past Surgical History:   Procedure Laterality Date   ??? COLONOSCOPY  07/09/2012        ??? EGD  07/09/2012        ??? HX GI      EGD         Family History:   Problem Relation Age of Onset   ??? Hypertension Other        Social History     Social History   ??? Marital status: SINGLE     Spouse name: N/A   ??? Number of children: N/A   ??? Years of education: N/A     Occupational History   ??? Not on file.     Social History Main Topics   ??? Smoking status: Current Some Day Smoker   ??? Smokeless tobacco: Never Used   ??? Alcohol use Yes      Comment: "I drink everyday"    ??? Drug use: No   ??? Sexual activity: Not on file     Other Topics Concern   ??? Not on file     Social History Narrative         ALLERGIES: Morphine and Shellfish derived    Review of Systems   Gastrointestinal: Negative for abdominal pain.   Skin: Positive for rash.   All other systems reviewed and are negative.      Vitals:    08/13/16 1832   BP: (!) 137/92   Pulse: (!) 107   Resp: 16   Temp: 97.8 ??F (36.6 ??C)   SpO2: 99%    Weight: 52.2 kg (115 lb)   Height: '5\' 6"'$  (1.676 m)            Physical Exam   Constitutional: He appears well-nourished. No distress.   Slightly unkept   HENT:   Head: Normocephalic and atraumatic.   Cardiovascular: Normal rate and regular rhythm.    HR 99   Pulmonary/Chest: Effort normal and breath sounds normal. No respiratory distress. He has no wheezes.   Abdominal: Soft. Bowel sounds are normal. He exhibits no distension. There is no tenderness.   Neurological: He is alert.   Skin: He is not diaphoretic.   Non-distinct rash right arm - red blanching macules - no bugs  MDM  Number of Diagnoses or Management Options  Chronic pancreatitis, unspecified pancreatitis type [facility]):   Hypertension, unspecified type:   Rash:   Diagnosis management comments: Labs baseline - elevated lipase hx of chronic pancreatitis - needs BP meds - no indicationf ro further eval    ED Course       Procedures  Vitals:  Patient Vitals for the past 12 hrs:   Temp Pulse Resp BP SpO2   08/13/16 1832 97.8 ??F (36.6 ??C) (!) 107 16 (!) 137/92 99 %       Medications ordered:   Medications - No data to display      Lab findings:  Recent Results (from the past 12 hour(s))   CBC WITH AUTOMATED DIFF    Collection Time: 08/13/16  7:00 PM   Result Value Ref Range    WBC 6.7 4.6 - 13.2 K/uL    RBC 3.53 (L) 4.70 - 5.50 M/uL    HGB 10.9 (L) 13.0 - 16.0 g/dL    HCT 31.4 (L) 36.0 - 48.0 %    MCV 89.0 74.0 - 97.0 FL    MCH 30.9 24.0 - 34.0 PG    MCHC 34.7 31.0 - 37.0 g/dL    RDW 13.8 11.6 - 14.5 %    PLATELET 317 135 - 420 K/uL    MPV 8.5 (L) 9.2 - 11.8 FL    NEUTROPHILS 38 (L) 40 - 73 %    LYMPHOCYTES 50 21 - 52 %    MONOCYTES 8 3 - 10 %    EOSINOPHILS 3 0 - 5 %    BASOPHILS 1 0 - 2 %    ABS. NEUTROPHILS 2.5 1.8 - 8.0 K/UL    ABS. LYMPHOCYTES 3.4 0.9 - 3.6 K/UL    ABS. MONOCYTES 0.5 0.05 - 1.2 K/UL    ABS. EOSINOPHILS 0.2 0.0 - 0.4 K/UL    ABS. BASOPHILS 0.1 (H) 0.0 - 0.06 K/UL    DF AUTOMATED     HEPATIC FUNCTION PANEL     Collection Time: 08/13/16  7:00 PM   Result Value Ref Range    Protein, total 6.4 6.4 - 8.2 g/dL    Albumin 2.6 (L) 3.4 - 5.0 g/dL    Globulin 3.8 2.0 - 4.0 g/dL    A-G Ratio 0.7 (L) 0.8 - 1.7      Bilirubin, total 0.3 0.2 - 1.0 MG/DL    Bilirubin, direct <0.1 0.0 - 0.2 MG/DL    Alk. phosphatase 224 (H) 45 - 117 U/L    AST (SGOT) 50 (H) 15 - 37 U/L    ALT (SGPT) 29 16 - 61 U/L   LIPASE    Collection Time: 08/13/16  7:00 PM   Result Value Ref Range    Lipase 479 (H) 73 - 643 U/L   METABOLIC PANEL, BASIC    Collection Time: 08/13/16  7:00 PM   Result Value Ref Range    Sodium 146 (H) 136 - 145 mmol/L    Potassium 3.8 3.5 - 5.5 mmol/L    Chloride 107 100 - 108 mmol/L    CO2 27 21 - 32 mmol/L    Anion gap 12 3.0 - 18 mmol/L    Glucose 104 (H) 74 - 99 mg/dL    BUN 2 (L) 7.0 - 18 MG/DL    Creatinine 0.43 (L) 0.6 - 1.3 MG/DL    BUN/Creatinine ratio 5 (L) 12 - 20      GFR est AA >60 >60  ml/min/1.57m    GFR est non-AA >60 >60 ml/min/1.7107m   Calcium 7.7 (L) 8.5 - 10.1 MG/DL       Disposition:  Diagnosis:   1. Chronic pancreatitis, unspecified pancreatitis type ([facility]   2. Rash    3. Hypertension, unspecified type        Disposition: Dsicharged    Follow-up Information     Follow up With Details Comments Contact Info    [facility]  If symptoms worsen [id number] an [facility] as soon as possible for a visit in 1 day  [id number]  [phone]         Patient's Medications   Start Taking    HYDROCHLOROTHIAZIDE (HYDRODIURIL) 25 MG TABLET    Take 1 Tab by mouth daily for 30 days.    HYDROCODONE-ACETAMINOPHEN (NORCO) 5-325 MG PER TABLET    Take 1 Tab by mouth every four (4) hours as needed for Pain. Max Daily Amount: 6 Tabs.    PERMETHRIN (ACTICIN) 5 % TOPICAL CREAM    Apply to entire body for 10 minutes then wash off   Continue Taking    FOLIC ACID 40263CG TABLET    Take 400 mcg by mouth daily.     LORAZEPAM (ATIVAN) 1 MG TABLET    Take 1-2 Tabs by mouth every six (6) hours as needed for 15 doses.    OTHER        PHENYTOIN (DILANTIN INFATABS) 50 MG CHEWABLE TABLET    Take 4 Tabs by mouth daily.    PHENYTOIN ER (DILANTIN) 100 MG ER CAPSULE    100 mg three (3) times daily.   These Medications have changed    No medications on file   Stop Taking    No medications on file     ScBuckeyecting as a scribe for and in the presence of MiMariane MastersMD      August 13, 2016 at 6:37 PM       Provider Attestation:      I personally performed the services described in the documentation, reviewed the documentation, as recorded by the scribe in my presence, and it accurately and completely records my words and actions. August 13, 2016 at 6:37 PM - MiMariane MastersMD

## 2016-08-14 ENCOUNTER — Emergency Department: Admit: 2016-08-15 | Payer: Self-pay | Primary: Internal Medicine

## 2016-08-14 DIAGNOSIS — G8929 Other chronic pain: Secondary | ICD-10-CM

## 2016-08-14 LAB — HEPATIC FUNCTION PANEL
A-G Ratio: 0.7 — ABNORMAL LOW (ref 0.8–1.7)
ALT (SGPT): 29 U/L (ref 16–61)
AST (SGOT): 50 U/L — ABNORMAL HIGH (ref 15–37)
Albumin: 2.6 g/dL — ABNORMAL LOW (ref 3.4–5.0)
Alk. phosphatase: 224 U/L — ABNORMAL HIGH (ref 45–117)
Bilirubin, direct: <0.1 MG/DL (ref 0.0–0.2)
Bilirubin, total: 0.3 MG/DL (ref 0.2–1.0)
Globulin: 3.8 g/dL (ref 2.0–4.0)
Protein, total: 6.4 g/dL (ref 6.4–8.2)

## 2016-08-14 MED ORDER — PERMETHRIN 5 % TOPICAL CREAM
5 % | CUTANEOUS | 0 refills | Status: DC
Start: 2016-08-14 — End: 2016-08-21

## 2016-08-14 MED ORDER — HYDROCODONE-ACETAMINOPHEN 5 MG-325 MG TAB
5-325 mg | ORAL_TABLET | ORAL | 0 refills | Status: DC | PRN
Start: 2016-08-14 — End: 2016-08-21

## 2016-08-14 MED ORDER — HYDROCHLOROTHIAZIDE 25 MG TAB
25 mg | ORAL_TABLET | Freq: Every day | ORAL | 0 refills | Status: DC
Start: 2016-08-14 — End: 2016-08-21

## 2016-08-14 NOTE — ED Provider Notes (Signed)
HPI Comments: Jeremy Walters is a 55 y.o. Male who was here yesterday for abd pain, alcohol returns for concerns of possibly getting ready to have a seizure due to lack of alcohol which he remedied by drinking a bottle of wine. C/o continued upper abd pain from his pancreas. Also coughing, sob from inhaling bug spray as well. Also reports vomiting but was able to drink the wine without issue. Dec solid food intake    The history is provided by the patient.        Past Medical History:   Diagnosis Date   ??? Alcoholism ([place])    ??? GERD (gastroesophageal reflux disease)    ??? Hypertension    ??? Pancreatitis chronic    ??? Seizure ([place])    ??? Seizures ([place])        Past Surgical History:   Procedure Laterality Date   ??? COLONOSCOPY  07/09/2012        ??? EGD  07/09/2012        ??? HX GI      EGD         Family History:   Problem Relation Age of Onset   ??? Hypertension Other        Social History     Social History   ??? Marital status: SINGLE     Spouse name: N/A   ??? Number of children: N/A   ??? Years of education: N/A     Occupational History   ??? Not on file.     Social History Main Topics   ??? Smoking status: Current Some Day Smoker   ??? Smokeless tobacco: Never Used   ??? Alcohol use Yes      Comment: "I drink everyday"    ??? Drug use: No   ??? Sexual activity: Not on file     Other Topics Concern   ??? Not on file     Social History Narrative         ALLERGIES: Morphine and Shellfish derived    Review of Systems   Constitutional: Negative for fever.   HENT: Negative for sore throat.    Eyes: Negative for visual disturbance.   Respiratory: Positive for cough.    Cardiovascular: Negative for leg swelling.   Gastrointestinal: Positive for abdominal pain. Negative for blood in stool.   Endocrine: Negative for polyuria.   Genitourinary: Negative for difficulty urinating.   Musculoskeletal: Negative for gait problem.   Skin: Negative for wound.   Allergic/Immunologic: Negative for immunocompromised state.   Neurological: Negative for syncope.    Psychiatric/Behavioral: Positive for sleep disturbance.       Vitals:    08/14/16 2235   BP: 112/85   Pulse: (!) 110   Resp: 16   Temp: 98 ??F (36.7 ??C)   SpO2: 97%   Weight: 52.2 kg (115 lb)   Height: 5' 6"  (1.676 m)            Physical Exam   Constitutional: He is oriented to person, place, and time.  Non-toxic appearance. He does not appear ill. No distress.   Smells of alcohol     HENT:   Head: Normocephalic and atraumatic.   Right Ear: External ear normal.   Left Ear: External ear normal.   Nose: Nose normal.   Mouth/Throat: Oropharynx is clear and moist. No oropharyngeal exudate.   Eyes: Conjunctivae are normal.   Neck: Normal range of motion.   Cardiovascular: Normal rate, regular rhythm, normal heart sounds and  intact distal pulses.    Pulmonary/Chest: Effort normal and breath sounds normal. No respiratory distress.   Abdominal: Soft. Normal appearance. He exhibits no distension and no mass. There is no hepatosplenomegaly. There is tenderness in the epigastric area. There is no rigidity, no rebound, no guarding and no CVA tenderness.       Musculoskeletal: Normal range of motion. He exhibits no edema.   Neurological: He is alert and oriented to person, place, and time.   Skin: Skin is warm and dry. He is not diaphoretic.   Psychiatric: His behavior is normal.   Nursing note and vitals reviewed.       MDM  ED Course       Procedures    Vitals:  Patient Vitals for the past 12 hrs:   Temp Pulse Resp BP SpO2   08/14/16 2235 98 ??F (36.7 ??C) (!) 110 16 112/85 97 %         Medications ordered:   Medications   0.9% sodium chloride 1,000 mL with mvi, adult no.4 with vit K 10 mL, thiamine 161 mg, folic acid 1 mg infusion ( IntraVENous New Bag 08/14/16 2330)         Lab findings:  Recent Results (from the past 12 hour(s))   CBC WITH AUTOMATED DIFF    Collection Time: 08/14/16 10:40 PM   Result Value Ref Range    WBC 6.6 4.6 - 13.2 K/uL    RBC 3.94 (L) 4.70 - 5.50 M/uL    HGB 12.1 (L) 13.0 - 16.0 g/dL     HCT 35.3 (L) 36.0 - 48.0 %    MCV 89.6 74.0 - 97.0 FL    MCH 30.7 24.0 - 34.0 PG    MCHC 34.3 31.0 - 37.0 g/dL    RDW 13.9 11.6 - 14.5 %    PLATELET 315 135 - 420 K/uL    MPV 8.9 (L) 9.2 - 11.8 FL    NEUTROPHILS 41 40 - 73 %    LYMPHOCYTES 48 21 - 52 %    MONOCYTES 6 3 - 10 %    EOSINOPHILS 4 0 - 5 %    BASOPHILS 1 0 - 2 %    ABS. NEUTROPHILS 2.7 1.8 - 8.0 K/UL    ABS. LYMPHOCYTES 3.2 0.9 - 3.6 K/UL    ABS. MONOCYTES 0.4 0.05 - 1.2 K/UL    ABS. EOSINOPHILS 0.3 0.0 - 0.4 K/UL    ABS. BASOPHILS 0.1 (H) 0.0 - 0.06 K/UL    DF AUTOMATED     HEPATIC FUNCTION PANEL    Collection Time: 08/14/16 10:40 PM   Result Value Ref Range    Protein, total 6.0 (L) 6.4 - 8.2 g/dL    Albumin 2.5 (L) 3.4 - 5.0 g/dL    Globulin 3.5 2.0 - 4.0 g/dL    A-G Ratio 0.7 (L) 0.8 - 1.7      Bilirubin, total 0.2 0.2 - 1.0 MG/DL    Bilirubin, direct 0.1 0.0 - 0.2 MG/DL    Alk. phosphatase 226 (H) 45 - 117 U/L    AST (SGOT) 42 (H) 15 - 37 U/L    ALT (SGPT) 26 16 - 61 U/L   LIPASE    Collection Time: 08/14/16 10:40 PM   Result Value Ref Range    Lipase 292 73 - 096 U/L   METABOLIC PANEL, BASIC    Collection Time: 08/14/16 10:40 PM   Result Value Ref Range    Sodium 143 136 -  145 mmol/L    Potassium 3.7 3.5 - 5.5 mmol/L    Chloride 110 (H) 100 - 108 mmol/L    CO2 25 21 - 32 mmol/L    Anion gap 8 3.0 - 18 mmol/L    Glucose 94 74 - 99 mg/dL    BUN 3 (L) 7.0 - 18 MG/DL    Creatinine 0.44 (L) 0.6 - 1.3 MG/DL    BUN/Creatinine ratio 7 (L) 12 - 20      GFR est AA >60 >60 ml/min/1.27m    GFR est non-AA >60 >60 ml/min/1.767m   Calcium 7.5 (L) 8.5 - 10.1 MG/DL   ETHYL ALCOHOL    Collection Time: 08/14/16 10:40 PM   Result Value Ref Range    ALCOHOL(ETHYL),SERUM 312 (H) 0 - 3 MG/DL       EKG interpretation by ED Physician:      X-Ray, CT or other radiology findings or impressions:  XR CHEST PORT    (Results Pending)     Nothing acute  Progress notes, Consult notes or additional Procedure notes:    Doubt need for other imaging, admission,. Pt with chronic alcohol abuse. Given vitamin, thiamine replacement here  I have discussed with patient and/or family/sig other the results, interpretation of any imaging if performed, suspected diagnosis and treatment plan to include instructions regarding the diagnoses listed to which understanding was expressed with all questions answered      Reevaluation of patient:   Stable for dc    Disposition:  Diagnosis:   1. Abdominal pain, chronic, epigastric    2. Alcohol abuse        Disposition: home      Follow-up Information     Follow up With Details Comments [place] an appointment as soon as possible for a visit within next week for recheck or with your regular doctor if you have one for recheck [phone]          Patient's Medications   Start Taking    No medications on file   Continue Taking    FOLIC ACID 40062CG TABLET    Take 400 mcg by mouth daily.    HYDROCHLOROTHIAZIDE (HYDRODIURIL) 25 MG TABLET    Take 1 Tab by mouth daily for 30 days.    HYDROCODONE-ACETAMINOPHEN (NORCO) 5-325 MG PER TABLET    Take 1 Tab by mouth every four (4) hours as needed for Pain. Max Daily Amount: 6 Tabs.    LORAZEPAM (ATIVAN) 1 MG TABLET    Take 1-2 Tabs by mouth every six (6) hours as needed for 15 doses.    OTHER        PERMETHRIN (ACTICIN) 5 % TOPICAL CREAM    Apply to entire body for 10 minutes then wash off    PHENYTOIN (DILANTIN INFATABS) 50 MG CHEWABLE TABLET    Take 4 Tabs by mouth daily.    PHENYTOIN ER (DILANTIN) 100 MG ER CAPSULE    100 mg three (3) times daily.   These Medications have changed    No medications on file   Stop Taking    No medications on file

## 2016-08-14 NOTE — ED Triage Notes (Signed)
Patient arrived via EMS with initial complaint of abdominal pain. When asked why he was here patient stated "[facility] in [facility] for [facility]." When asked why he called the ambulance patient stated "Because I'm dying." When asked why he was dying patient stated that he was ill. When asked if he was having any pain patient stated "Yes in my ass, I just took a big dump." Patient admits to drinking a bottle of wine. Denies SI/HI.

## 2016-08-15 ENCOUNTER — Inpatient Hospital Stay
Admit: 2016-08-15 | Discharge: 2016-08-15 | Disposition: A | Discharge status: Home / Self Care | Payer: Self-pay | Attending: Emergency Medicine

## 2016-08-15 LAB — METABOLIC PANEL, BASIC
Anion gap: 8 mmol/L (ref 3.0–18)
BUN/Creatinine ratio: 7 — ABNORMAL LOW (ref 12–20)
BUN: 3 MG/DL — ABNORMAL LOW (ref 7.0–18)
CO2: 25 mmol/L (ref 21–32)
Calcium: 7.5 MG/DL — ABNORMAL LOW (ref 8.5–10.1)
Chloride: 110 mmol/L — ABNORMAL HIGH (ref 100–108)
Creatinine: 0.44 MG/DL — ABNORMAL LOW (ref 0.6–1.3)
GFR est AA: >60 mL/min/{1.73_m2} (ref >60)
GFR est non-AA: >60 mL/min/{1.73_m2} (ref >60)
Glucose: 94 mg/dL (ref 74–99)
Potassium: 3.7 mmol/L (ref 3.5–5.5)
Sodium: 143 mmol/L (ref 136–145)

## 2016-08-15 LAB — CBC WITH AUTOMATED DIFF
ABS. BASOPHILS: 0.1 10*3/uL — ABNORMAL HIGH (ref 0.0–0.06)
ABS. EOSINOPHILS: 0.3 10*3/uL (ref 0.0–0.4)
ABS. LYMPHOCYTES: 3.2 10*3/uL (ref 0.9–3.6)
ABS. MONOCYTES: 0.4 10*3/uL (ref 0.05–1.2)
ABS. NEUTROPHILS: 2.7 10*3/uL (ref 1.8–8.0)
BASOPHILS: 1 % (ref 0–2)
EOSINOPHILS: 4 % (ref 0–5)
HCT: 35.3 % — ABNORMAL LOW (ref 36.0–48.0)
HGB: 12.1 g/dL — ABNORMAL LOW (ref 13.0–16.0)
LYMPHOCYTES: 48 % (ref 21–52)
MCH: 30.7 PG (ref 24.0–34.0)
MCHC: 34.3 g/dL (ref 31.0–37.0)
MCV: 89.6 FL (ref 74.0–97.0)
MONOCYTES: 6 % (ref 3–10)
MPV: 8.9 FL — ABNORMAL LOW (ref 9.2–11.8)
NEUTROPHILS: 41 % (ref 40–73)
PLATELET: 315 10*3/uL (ref 135–420)
RBC: 3.94 M/uL — ABNORMAL LOW (ref 4.70–5.50)
RDW: 13.9 % (ref 11.6–14.5)
WBC: 6.6 10*3/uL (ref 4.6–13.2)

## 2016-08-15 LAB — HEPATIC FUNCTION PANEL
A-G Ratio: 0.7 — ABNORMAL LOW (ref 0.8–1.7)
ALT (SGPT): 26 U/L (ref 16–61)
AST (SGOT): 42 U/L — ABNORMAL HIGH (ref 15–37)
Albumin: 2.5 g/dL — ABNORMAL LOW (ref 3.4–5.0)
Alk. phosphatase: 226 U/L — ABNORMAL HIGH (ref 45–117)
Bilirubin, direct: 0.1 MG/DL (ref 0.0–0.2)
Bilirubin, total: 0.2 MG/DL (ref 0.2–1.0)
Globulin: 3.5 g/dL (ref 2.0–4.0)
Protein, total: 6 g/dL — ABNORMAL LOW (ref 6.4–8.2)

## 2016-08-15 LAB — ETHYL ALCOHOL: ALCOHOL(ETHYL),SERUM: 312 MG/DL — ABNORMAL HIGH (ref 0–3)

## 2016-08-15 LAB — LIPASE: Lipase: 292 U/L (ref 73–393)

## 2016-08-15 MED ORDER — MVI,ADULT NO.4,VIT K 3300 UNIT-150 MCG/10 ML INTRAVENOUS SOLUTION
3300 unit- 150 mcg/10 mL | Freq: Once | INTRAVENOUS | Status: AC
Start: 2016-08-15 — End: 2016-08-15
  Administered 2016-08-15: 04:00:00 via INTRAVENOUS

## 2016-08-15 MED ORDER — SODIUM CHLORIDE 0.9% BOLUS IV
0.9 % | Freq: Once | INTRAVENOUS | Status: DC
Start: 2016-08-15 — End: 2016-08-14
  Administered 2016-08-15: 02:00:00 via INTRAVENOUS

## 2016-08-15 MED FILL — SODIUM CHLORIDE 0.9 % IV: INTRAVENOUS | Qty: 1000

## 2016-08-15 NOTE — ED Notes (Signed)
Pt states he will call his father to come pick him up. I have reviewed discharge instructions with the patient.  The patient verbalized understanding.  Pt left in stable condition. Patient armband removed and shredded.

## 2016-08-16 ENCOUNTER — Inpatient Hospital Stay
Admit: 2016-08-16 | Discharge: 2016-08-16 | Disposition: A | Discharge status: Home / Self Care | Payer: Self-pay | Attending: Emergency Medicine

## 2016-08-16 ENCOUNTER — Emergency Department: Admit: 2016-08-16 | Payer: Self-pay | Primary: Internal Medicine

## 2016-08-16 DIAGNOSIS — F10129 Alcohol abuse with intoxication, unspecified: Secondary | ICD-10-CM

## 2016-08-16 LAB — METABOLIC PANEL, COMPREHENSIVE
A-G Ratio: 0.8 (ref 0.8–1.7)
ALT (SGPT): 27 U/L (ref 16–61)
AST (SGOT): 45 U/L — ABNORMAL HIGH (ref 15–37)
Albumin: 2.9 g/dL — ABNORMAL LOW (ref 3.4–5.0)
Alk. phosphatase: 254 U/L — ABNORMAL HIGH (ref 45–117)
Anion gap: 11 mmol/L (ref 3.0–18)
BUN/Creatinine ratio: 4 — ABNORMAL LOW (ref 12–20)
BUN: 2 MG/DL — ABNORMAL LOW (ref 7.0–18)
Bilirubin, total: 0.3 MG/DL (ref 0.2–1.0)
CO2: 26 mmol/L (ref 21–32)
Calcium: 7.7 MG/DL — ABNORMAL LOW (ref 8.5–10.1)
Chloride: 110 mmol/L — ABNORMAL HIGH (ref 100–108)
Creatinine: 0.45 MG/DL — ABNORMAL LOW (ref 0.6–1.3)
GFR est AA: >60 mL/min/{1.73_m2} (ref >60)
GFR est non-AA: >60 mL/min/{1.73_m2} (ref >60)
Globulin: 3.8 g/dL (ref 2.0–4.0)
Glucose: 90 mg/dL (ref 74–99)
Potassium: 3.8 mmol/L (ref 3.5–5.5)
Protein, total: 6.7 g/dL (ref 6.4–8.2)
Sodium: 147 mmol/L — ABNORMAL HIGH (ref 136–145)

## 2016-08-16 LAB — LIPASE: Lipase: 1064 U/L — ABNORMAL HIGH (ref 73–393)

## 2016-08-16 LAB — CBC WITH AUTOMATED DIFF
ABS. BASOPHILS: 0 10*3/uL (ref 0.0–0.06)
ABS. EOSINOPHILS: 0.2 10*3/uL (ref 0.0–0.4)
ABS. LYMPHOCYTES: 2.9 10*3/uL (ref 0.9–3.6)
ABS. MONOCYTES: 0.4 10*3/uL (ref 0.05–1.2)
ABS. NEUTROPHILS: 2.6 10*3/uL (ref 1.8–8.0)
BASOPHILS: 1 % (ref 0–2)
EOSINOPHILS: 4 % (ref 0–5)
HCT: 36.7 % (ref 36.0–48.0)
HGB: 12.5 g/dL — ABNORMAL LOW (ref 13.0–16.0)
LYMPHOCYTES: 47 % (ref 21–52)
MCH: 30.5 PG (ref 24.0–34.0)
MCHC: 34.1 g/dL (ref 31.0–37.0)
MCV: 89.5 FL (ref 74.0–97.0)
MONOCYTES: 6 % (ref 3–10)
MPV: 8.4 FL — ABNORMAL LOW (ref 9.2–11.8)
NEUTROPHILS: 42 % (ref 40–73)
PLATELET: 267 10*3/uL (ref 135–420)
RBC: 4.1 M/uL — ABNORMAL LOW (ref 4.70–5.50)
RDW: 14 % (ref 11.6–14.5)
WBC: 6.1 10*3/uL (ref 4.6–13.2)

## 2016-08-16 LAB — ETHYL ALCOHOL: ALCOHOL(ETHYL),SERUM: 442 MG/DL — CR (ref 0–3)

## 2016-08-16 MED ORDER — FOLIC ACID 5 MG/ML IJ SOLN
5 mg/mL | Freq: Once | INTRAMUSCULAR | Status: AC
Start: 2016-08-16 — End: 2016-08-16
  Administered 2016-08-16: 15:00:00 via INTRAVENOUS

## 2016-08-16 MED FILL — SODIUM CHLORIDE 0.9 % IV: INTRAVENOUS | Qty: 1000

## 2016-08-16 NOTE — ED Notes (Signed)
Report given to Natalia, RN

## 2016-08-16 NOTE — ED Notes (Signed)
Assumed care of pt for discharge.  Pt provided with bus pass and list of shelters.  I have reviewed discharge instructions with the patient.  The patient verbalized understanding.  Patient armband removed and shredded

## 2016-08-16 NOTE — ED Provider Notes (Signed)
HPI Comments: 10:10 AM Jeremy Walters is a 55 y.o. male with a h/o pancreatitis, seizures, alcoholism, HTN, and GERD who presents to the ED via EMS for alcohol intoxication. EMS reports finding the patient in bushes. EMS states that the patient informed that "he feels like he is going to die." The patient states that he "drank 1 bottle of wine and fell asleep." He reports being released from jail on 10/3 and being homeless since. He notes that while in jail he was in solitary confinement and starving. He reports vomiting and is c/o nausea. The patient denies SI, hallucinations, and additional complaints or concerns.     The history is provided by the patient.        Past Medical History:   Diagnosis Date   ??? Alcoholism ([place])    ??? GERD (gastroesophageal reflux disease)    ??? Hypertension    ??? Pancreatitis chronic    ??? Seizure ([place])    ??? Seizures ([place])        Past Surgical History:   Procedure Laterality Date   ??? COLONOSCOPY  07/09/2012        ??? EGD  07/09/2012        ??? HX GI      EGD         Family History:   Problem Relation Age of Onset   ??? Hypertension Other        Social History     Social History   ??? Marital status: SINGLE     Spouse name: N/A   ??? Number of children: N/A   ??? Years of education: N/A     Occupational History   ??? Not on file.     Social History Main Topics   ??? Smoking status: Current Some Day Smoker   ??? Smokeless tobacco: Never Used   ??? Alcohol use Yes      Comment: "I drink everyday"    ??? Drug use: No   ??? Sexual activity: Not on file     Other Topics Concern   ??? Not on file     Social History Narrative         ALLERGIES: Morphine and Shellfish derived    Review of Systems   Constitutional: Negative for diaphoresis and fever.   HENT: Negative for ear pain, rhinorrhea and trouble swallowing.    Eyes: Negative for visual disturbance.   Respiratory: Negative for cough and shortness of breath.    Cardiovascular: Negative for chest pain and leg swelling.    Gastrointestinal: Positive for nausea and vomiting. Negative for abdominal pain, blood in stool and diarrhea.   Genitourinary: Negative for difficulty urinating, flank pain and hematuria.   Musculoskeletal: Negative for back pain and neck pain.   Skin: Negative for rash.   Neurological: Negative for dizziness, weakness, numbness and headaches.   Hematological: Negative.    Psychiatric/Behavioral: Negative.    All other systems reviewed and are negative.      Vitals:    08/16/16 1018   BP: (!) 136/92   Pulse: 94   Resp: 21   Temp: 97.8 ??F (36.6 ??C)   SpO2: 100%            Physical Exam   Constitutional: He is oriented to person, place, and time. He appears well-developed and well-nourished. No distress.   HENT:   Head: Normocephalic and atraumatic.   Mouth/Throat: Oropharynx is clear and moist.   Eyes: Conjunctivae and EOM are normal. Pupils are  equal, round, and reactive to light. No scleral icterus.   Neck: Normal range of motion. Neck supple.   Cardiovascular: Normal rate, regular rhythm and normal heart sounds.    No murmur heard.  Pulmonary/Chest: Effort normal and breath sounds normal. No respiratory distress.   Abdominal: Soft. Bowel sounds are normal. He exhibits no distension. There is no tenderness.   Musculoskeletal: He exhibits no edema.   Lymphadenopathy:     He has no cervical adenopathy.   Neurological: He is alert and oriented to person, place, and time. Coordination normal.   Skin: Skin is warm and dry. No rash noted.   Psychiatric: He has a normal mood and affect. His behavior is normal.   Nursing note and vitals reviewed.       MDM  Number of Diagnoses or Management Options  Alcoholic intoxication without complication ([place]):   Alcohol-induced acute pancreatitis without infection or necrosis:   Diagnosis management comments: Acute alcohol intoxication will continue to observe in Ed ct head neg noted elevated lipase denies abd pain or vomiting in ED     4:38 PM   Pt awake able to ambulate in Ed with no distress will dc home        Amount and/or Complexity of Data Reviewed  Clinical lab tests: ordered and reviewed  Tests in the radiology section of CPT??: ordered and reviewed  Independent visualization of images, tracings, or specimens: yes    Risk of Complications, Morbidity, and/or Mortality  Presenting problems: high  Diagnostic procedures: moderate  Management options: moderate      ED Course       Other Procedure  Date/Time: 08/16/2016 12:16 PM  Performed by: Threasa Beards  Authorized by: Threasa Beards     Consent:     Consent obtained:  Verbal    Consent given by:  Patient    Risks discussed:  Bleeding and pain    Alternatives discussed:  No treatment  Pre-procedure details:     Skin preparation:  Antiseptic wash  Anesthesia (see MAR for exact dosages):     Anesthesia method:  None  Post-procedure details:     Patient tolerance of procedure:  Tolerated well, no immediate complications  Comments:      US guided 18 gage placed right AC        Vitals:  Patient Vitals for the past 12 hrs:   Temp Pulse Resp BP SpO2   08/16/16 1018 97.8 ??F (36.6 ??C) 94 21 (!) 136/92 100 %         Medications ordered:   Medications   0.9% sodium chloride 1,000 mL with mvi, adult no.4 with vit K 10 mL, thiamine 382 mg, folic acid 1 mg infusion ( IntraVENous IV Completed 08/16/16 1621)         Lab findings:  Recent Results (from the past 12 hour(s))   ETHYL ALCOHOL    Collection Time: 08/16/16 11:11 AM   Result Value Ref Range    ALCOHOL(ETHYL),SERUM 442 (HH) 0 - 3 MG/DL   CBC WITH AUTOMATED DIFF    Collection Time: 08/16/16 11:11 AM   Result Value Ref Range    WBC 6.1 4.6 - 13.2 K/uL    RBC 4.10 (L) 4.70 - 5.50 M/uL    HGB 12.5 (L) 13.0 - 16.0 g/dL    HCT 36.7 36.0 - 48.0 %    MCV 89.5 74.0 - 97.0 FL    MCH 30.5 24.0 - 34.0 PG    MCHC  34.1 31.0 - 37.0 g/dL    RDW 14.0 11.6 - 14.5 %    PLATELET 267 135 - 420 K/uL    MPV 8.4 (L) 9.2 - 11.8 FL    NEUTROPHILS 42 40 - 73 %     LYMPHOCYTES 47 21 - 52 %    MONOCYTES 6 3 - 10 %    EOSINOPHILS 4 0 - 5 %    BASOPHILS 1 0 - 2 %    ABS. NEUTROPHILS 2.6 1.8 - 8.0 K/UL    ABS. LYMPHOCYTES 2.9 0.9 - 3.6 K/UL    ABS. MONOCYTES 0.4 0.05 - 1.2 K/UL    ABS. EOSINOPHILS 0.2 0.0 - 0.4 K/UL    ABS. BASOPHILS 0.0 0.0 - 0.06 K/UL    DF AUTOMATED     METABOLIC PANEL, COMPREHENSIVE    Collection Time: 08/16/16 11:11 AM   Result Value Ref Range    Sodium 147 (H) 136 - 145 mmol/L    Potassium 3.8 3.5 - 5.5 mmol/L    Chloride 110 (H) 100 - 108 mmol/L    CO2 26 21 - 32 mmol/L    Anion gap 11 3.0 - 18 mmol/L    Glucose 90 74 - 99 mg/dL    BUN 2 (L) 7.0 - 18 MG/DL    Creatinine 0.45 (L) 0.6 - 1.3 MG/DL    BUN/Creatinine ratio 4 (L) 12 - 20      GFR est AA >60 >60 ml/min/1.72m    GFR est non-AA >60 >60 ml/min/1.739m   Calcium 7.7 (L) 8.5 - 10.1 MG/DL    Bilirubin, total 0.3 0.2 - 1.0 MG/DL    ALT (SGPT) 27 16 - 61 U/L    AST (SGOT) 45 (H) 15 - 37 U/L    Alk. phosphatase 254 (H) 45 - 117 U/L    Protein, total 6.7 6.4 - 8.2 g/dL    Albumin 2.9 (L) 3.4 - 5.0 g/dL    Globulin 3.8 2.0 - 4.0 g/dL    A-G Ratio 0.8 0.8 - 1.7     LIPASE    Collection Time: 08/16/16 11:11 AM   Result Value Ref Range    Lipase 1064 (H) 73 - 393 U/L       EKG interpretation by ED Physician:      X-Ray, CT or other radiology findings or impressions:  CT HEAD WO CONT   Final Result   IMPRESSION:  ??  ??  1. No acute intracranial abnormality. Stable examination.  2. Periventricular white matter hypoattenuation; unchanged and nonspecific,  likely reflective of chronic ischemic microvascular change.        Progress notes, Consult notes or additional Procedure notes:       Reevaluation of patient:       Disposition:  Diagnosis:   1. [place]   2. Alcohol-induced acute pancreatitis without infection or necrosis        Disposition: home     Follow-up Information     Follow up With Details Comments Contact Info     [facility]  As needed, If symptoms worsen [id number] Schedule an appointment as soon as possible for a visit for ED follow up  [id number]  [phone]           Patient's Medications   Start Taking    No medications on file   Continue Taking    FOLIC ACID 846 MCG TABLET    Take 400 mcg by mouth daily.    HYDROCHLOROTHIAZIDE (HYDRODIURIL) 25 MG TABLET    Take 1 Tab by mouth daily for 30 days.    HYDROCODONE-ACETAMINOPHEN (NORCO) 5-325 MG PER TABLET    Take 1 Tab by mouth every four (4) hours as needed for Pain. Max Daily Amount: 6 Tabs.    LORAZEPAM (ATIVAN) 1 MG TABLET    Take 1-2 Tabs by mouth every six (6) hours as needed for 15 doses.    OTHER        PERMETHRIN (ACTICIN) 5 % TOPICAL CREAM    Apply to entire body for 10 minutes then wash off    PHENYTOIN (DILANTIN INFATABS) 50 MG CHEWABLE TABLET    Take 4 Tabs by mouth daily.    PHENYTOIN ER (DILANTIN) 100 MG ER CAPSULE    100 mg three (3) times daily.   These Medications have changed    No medications on file   Stop Taking    No medications on file         Scribe McFarland acting as a scribe for and in the presence of Threasa Beards, MD      August 16, 2016 at 11:05 AM       Provider Attestation:      I personally performed the services described in the documentation, reviewed the documentation, as recorded by the scribe in my presence, and it accurately and completely records my words and actions. August 16, 2016 at 11:05 AM - Threasa Beards, MD

## 2016-08-16 NOTE — ED Notes (Signed)
Pt removed, inventoried, and secured by Revonda StandardAllison, AstronomerR tech and Security Officer. Pt in paper scrubs.

## 2016-08-16 NOTE — ED Notes (Signed)
Pt states he drank a bottle of wine today.

## 2016-08-16 NOTE — ED Notes (Signed)
Pt ambulated around department. Awaiting phone number to call for ride home. MD aware.

## 2016-08-16 NOTE — ED Triage Notes (Signed)
Pt arrived via rescue intoxicated. Pt states he has pancreatic cancer and feels like he is going to die. Pt denies SI/HI.

## 2016-08-21 ENCOUNTER — Ambulatory Visit
Admit: 2016-08-21 | Discharge: 2016-08-21 | Payer: PRIVATE HEALTH INSURANCE | Attending: Internal Medicine | Primary: Internal Medicine

## 2016-08-21 ENCOUNTER — Inpatient Hospital Stay: Admit: 2016-08-21 | Payer: Self-pay | Primary: Internal Medicine

## 2016-08-21 DIAGNOSIS — D649 Anemia, unspecified: Secondary | ICD-10-CM

## 2016-08-21 DIAGNOSIS — R569 Unspecified convulsions: Secondary | ICD-10-CM

## 2016-08-21 NOTE — Patient Instructions (Addendum)
Seizure: Care Instructions  Your Care Instructions    Seizures are caused by abnormal patterns of electrical signals in the brain. They are different for each person.  Seizures can affect movement, speech, vision, or awareness. Some people have only slight shaking of a hand and do not pass out. Other people may pass out and have violent shaking of the whole body. Some people appear to stare into space. They are awake, but they can't respond normally. Later, they may not remember what happened.  You may need tests to identify the type and cause of the seizures.  A seizure may occur only once, or you may have them more than one time. Taking medicines as directed and following up with your doctor may help keep you from having more seizures.  The doctor has checked you carefully, but problems can develop later. If you notice any problems or new symptoms, get medical treatment right away.  Follow-up care is a key part of your treatment and safety. Be sure to make and go to all appointments, and call your doctor if you are having problems. It's also a good idea to know your test results and keep a list of the medicines you take.  How can you care for yourself at home?  ?? Be safe with medicines. Take your medicines exactly as prescribed. Call your doctor if you think you are having a problem with your medicine.  ?? Do not do any activity that could be dangerous to you or others until your doctor says it is safe to do so. For example, do not drive a car, operate machinery, swim, or climb ladders.  ?? Be sure that anyone treating you for any health problem knows that you have had a seizure and what medicines you are taking for it.  ?? Identify and avoid things that may make you more likely to have a seizure. These may include lack of sleep, alcohol or drug use, stress, or not eating.  ?? Make sure you go to your follow-up appointment.  When should you call for help?   Call 911 anytime you think you may need emergency care. For example, call if:  ?? You have another seizure.  ?? You have more than one seizure in 24 hours.  ?? You have new symptoms, such as trouble walking, speaking, or thinking clearly.  Call your doctor now or seek immediate medical care if:  ?? You are not acting normally.  Watch closely for changes in your health, and be sure to contact your doctor if you have any problems.  Where can you learn more?  Go to http://www.healthwise.net/[employer].  Enter M769 in the search box to learn more about "Seizure: Care Instructions."  Current as of: August 19, 2015  Content Version: 11.3  ?? 2006-2017 [employer]. Care instructions adapted under license by [employer] (which disclaims liability or warranty for this information). If you have questions about a medical condition or this instruction, always ask your healthcare professional. [employer], [employer] disclaims any warranty or liability for your use of this information.

## 2016-08-21 NOTE — Progress Notes (Signed)
History and Physical    Today's Date:  08/21/2016   Patient's Name: Jeremy Walters   Patient's DOB:  [birth year]     History:     Chief Complaint   Patient presents with   ??? Establish Care   ??? Depression   ??? Anxiety     Jeremy Walters is a 55 y.o. male presenting for initial visit to establish care.  Will obtain records from previous provider to review. Care team updated on connect care.     Hypertension   This is a chronic problem, new to me. BP is at goal. Pt is supposed to be on HCTZ. Pt has not picked up this medication.    Seizure  This is a chronic problem, new to me. This is controlled. Pt is supposed to be on dilantin. Pt has not picked up this medication.     Anxiety/Depression/Insomnia/Hallucinations  This is a chronic problem, new to me. This is not controlled. Pt has been referred to [facility]. Pt has not gone.     Past Medical History:   Diagnosis Date   ??? Alcoholism ([place])    ??? Depression    ??? GERD (gastroesophageal reflux disease)    ??? Hypertension    ??? Normocytic anemia 08/21/2016   ??? Pancreatitis chronic    ??? Seizure ([place])    ??? Seizures ([place])      Past Surgical History:   Procedure Laterality Date   ??? COLONOSCOPY  07/09/2012        ??? EGD  07/09/2012        ??? HX GI      EGD      reports that he quit smoking about 7 years ago. He has never used smokeless tobacco. He reports that he drinks alcohol. He reports that he does not use illicit drugs.  Family History   Problem Relation Age of Onset   ??? Stroke Mother    ??? Other Father      Skin Cancer   ??? No Known Problems Sister      Allergies   Allergen Reactions   ??? Morphine Rash and Nausea and Vomiting   ??? Shellfish Derived Rash and Nausea and Vomiting     Problem List:      Patient Active Problem List   Diagnosis Code   ??? Alcohol withdrawal seizure ([place]) F10.239, R56.9   ??? Seizure ([place]) R56.9   ??? SVT (supraventricular tachycardia) (HCC) I47.1   ??? 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 Abnormal liver enzymes R74.8   ??? Normocytic anemia D64.9     Medications:      No current outpatient prescriptions on file.     No current facility-administered medications for this visit.      Review of Systems:   (Positives in bold)   General:   fevers, chills, generalized weakness, fatigue, weight change, night sweats, appetite change   Neurologic: dizziness, lightheadedness, headaches, loss of consciousness, numbness, tingling (hands and feet), focal weakness  Eyes:  vision changes (sees lights), double vision, photophobia  Ears:  change in hearing, ear pain, ear discharge, ear ringing  Nose:  sneezing, runny nose (seasonal allergies), nasal congestion  Mouth/Throat: sore throat, voice change, dry mouth, difficulty swallowing  Neck:  pain, stiffness, swelling  Respiratory: dyspnea at rest, dyspnea on exertion, wheezing, cough, sputum production  Cardiovascular:   chest pain, palpitations, pedal edema, leg cramps  Gastrointestinal:  nausea, vomiting, abdominal pain, constipation, diarrhea, heart burn, bloody stools, tarry black stools,  rectal pain, hemorrhoids  Urinary: dysuria, urinary frequency, nocturia, malodorous urine, difficulty initiating flow, slow urine stream  Genital (M): penile discharge, ulcerations, rashes, erectile dysfunction  Musculoskeletal:  joint pain (knees and feet), joint stiffness, joint swelling, back pain, focal muscle pain, diffuse myalgias  Psychiatric: insomnia, anxiety, depression, hallucinations (sees aliens), suicidal ideation, homicidal ideation  Endocrine: polydipsia, polyuria, polyphagia, cold intolerance, heat intolerance  Hematologic: easy bruising, easy bleeding  Dermatologic: Itching, rash    Physical Assessment:   VS:    Visit Vitals   ??? BP 111/71 (BP 1 Location: Left arm, BP Patient Position: Sitting)   ??? Pulse (!) 107   ??? Temp 98 ??F (36.7 ??C) (Oral)   ??? Resp 16   ??? Ht _0  (1.676 m)   ??? Wt 122 lb 9.6 oz (55.6 kg)   ??? SpO2 95%   ??? BMI 19.79 kg/m2     General:   Well-groomed, well-nourished, in no distress, pleasant, alert,  appropriate and conversant.   Eyes:    PERRL, EOMI.    Mouth:  MMM, good dentition, oropharynx WNL without membranes, exudates, petechiae or ulcers  Neck:   Neck supple, no swelling, mass or tenderness  Cardiovascular:   No JVD.  RRR, no MRG.    Pulmonary:   Lungs clear bilaterally.  Normal respiratory effort.  Abdomen:   Abdomen soft, NT, ND, NAB  Extremities:   No edema, LEs warm and well-perfused.  Neuro:   Alert and oriented, no focal deficits. No facial asymmetry noted.  Skin:    No rash or jaundice  MSK:   Normal ROM, 5/5 muscle strength  Psych:  No pressured speech, speech is tangential    Admission on 08/16/2016, Discharged on 08/16/2016   Component Date Value Ref Range Status   ??? ALCOHOL(ETHYL),SERUM 08/16/2016 442* 0 - 3 MG/DL Final    Comment: CALLED TO AND CORRECTLY REPEATED BY:  [facility] HALVORSON,RN,ER,ON 08/16/16 AT 1143 TO RLM     ??? WBC 08/16/2016 6.1  4.6 - 13.2 K/uL Final   ??? RBC 08/16/2016 4.10* 4.70 - 5.50 M/uL Final   ??? HGB 08/16/2016 12.5* 13.0 - 16.0 g/dL Final   ??? HCT 08/16/2016 36.7  36.0 - 48.0 % Final   ??? MCV 08/16/2016 89.5  74.0 - 97.0 FL Final   ??? [place] 08/16/2016 30.5  24.0 - 34.0 PG Final   ??? MCHC 08/16/2016 34.1  31.0 - 37.0 g/dL Final   ??? RDW 08/16/2016 14.0  11.6 - 14.5 % Final   ??? PLATELET 08/16/2016 267  135 - 420 K/uL Final   ??? MPV 08/16/2016 8.4* 9.2 - 11.8 FL Final   ??? NEUTROPHILS 08/16/2016 42  40 - 73 % Final   ??? LYMPHOCYTES 08/16/2016 47  21 - 52 % Final   ??? MONOCYTES 08/16/2016 6  3 - 10 % Final   ??? EOSINOPHILS 08/16/2016 4  0 - 5 % Final   ??? BASOPHILS 08/16/2016 1  0 - 2 % Final   ??? ABS. NEUTROPHILS 08/16/2016 2.6  1.8 - 8.0 K/UL Final   ??? ABS. LYMPHOCYTES 08/16/2016 2.9  0.9 - 3.6 K/UL Final   ??? ABS. MONOCYTES 08/16/2016 0.4  0.05 - 1.2 K/UL Final   ??? ABS. EOSINOPHILS 08/16/2016 0.2  0.0 - 0.4 K/UL Final   ??? ABS. BASOPHILS 08/16/2016 0.0  0.0 - 0.06 K/UL Final   ??? DF 08/16/2016 AUTOMATED    Final   ??? Sodium 08/16/2016 147* 136 - 145 mmol/L Final    ???  Potassium 08/16/2016 3.8  3.5 - 5.5 mmol/L Final   ??? Chloride 08/16/2016 110* 100 - 108 mmol/L Final   ??? CO2 08/16/2016 26  21 - 32 mmol/L Final   ??? Anion gap 08/16/2016 11  3.0 - 18 mmol/L Final   ??? Glucose 08/16/2016 90  74 - 99 mg/dL Final   ??? BUN 08/16/2016 2* 7.0 - 18 MG/DL Final   ??? Creatinine 08/16/2016 0.45* 0.6 - 1.3 MG/DL Final   ??? BUN/Creatinine ratio 08/16/2016 4* 12 - 20   Final   ??? GFR est AA 08/16/2016 >60  >60 ml/min/1.21m Final   ??? GFR est non-AA 08/16/2016 >60  >60 ml/min/1.742mFinal    Comment: (NOTE)  Estimated GFR is calculated using the Modification of Diet in Renal   Disease (MDRD) Study equation, reported for both African Americans   (GFRAA) and non-African Americans (GFRNA), and normalized to 1.7375m body surface area. The physician must decide which value applies to   the patient. The MDRD study equation should only be used in   individuals age 11 39 older. It has not been validated for the   following: pregnant women, patients with serious comorbid conditions,   or on certain medications, or persons with extremes of body size,   muscle mass, or nutritional status.     ??? Calcium 08/16/2016 7.7* 8.5 - 10.1 MG/DL Final   ??? Bilirubin, total 08/16/2016 0.3  0.2 - 1.0 MG/DL Final   ??? ALT (SGPT) 08/16/2016 27  16 - 61 U/L Final   ??? AST (SGOT) 08/16/2016 45* 15 - 37 U/L Final   ??? Alk. phosphatase 08/16/2016 254* 45 - 117 U/L Final   ??? Protein, total 08/16/2016 6.7  6.4 - 8.2 g/dL Final   ??? Albumin 08/16/2016 2.9* 3.4 - 5.0 g/dL Final   ??? Globulin 08/16/2016 3.8  2.0 - 4.0 g/dL Final   ??? A-G Ratio 08/16/2016 0.8  0.8 - 1.7   Final   ??? Lipase 08/16/2016 1064* 73 - 393 U/L Final   Admission on 08/14/2016, Discharged on 08/15/2016   Component Date Value Ref Range Status   ??? WBC 08/14/2016 6.6  4.6 - 13.2 K/uL Final   ??? RBC 08/14/2016 3.94* 4.70 - 5.50 M/uL Final   ??? HGB 08/14/2016 12.1* 13.0 - 16.0 g/dL Final   ??? HCT 08/14/2016 35.3* 36.0 - 48.0 % Final    ??? MCV 08/14/2016 89.6  74.0 - 97.0 FL Final   ??? MCH 08/14/2016 30.7  24.0 - 34.0 PG Final   ??? MCHC 08/14/2016 34.3  31.0 - 37.0 g/dL Final   ??? RDW 08/14/2016 13.9  11.6 - 14.5 % Final   ??? PLATELET 08/14/2016 315  135 - 420 K/uL Final   ??? MPV 08/14/2016 8.9* 9.2 - 11.8 FL Final   ??? NEUTROPHILS 08/14/2016 41  40 - 73 % Final   ??? LYMPHOCYTES 08/14/2016 48  21 - 52 % Final   ??? MONOCYTES 08/14/2016 6  3 - 10 % Final   ??? EOSINOPHILS 08/14/2016 4  0 - 5 % Final   ??? BASOPHILS 08/14/2016 1  0 - 2 % Final   ??? ABS. NEUTROPHILS 08/14/2016 2.7  1.8 - 8.0 K/UL Final   ??? ABS. LYMPHOCYTES 08/14/2016 3.2  0.9 - 3.6 K/UL Final   ??? ABS. MONOCYTES 08/14/2016 0.4  0.05 - 1.2 K/UL Final   ??? ABS. EOSINOPHILS 08/14/2016 0.3  0.0 - 0.4 K/UL Final   ??? ABS. BASOPHILS 08/14/2016  0.1* 0.0 - 0.06 K/UL Final   ??? DF 08/14/2016 AUTOMATED    Final   ??? Protein, total 08/14/2016 6.0* 6.4 - 8.2 g/dL Final   ??? Albumin 08/14/2016 2.5* 3.4 - 5.0 g/dL Final   ??? Globulin 08/14/2016 3.5  2.0 - 4.0 g/dL Final   ??? A-G Ratio 08/14/2016 0.7* 0.8 - 1.7   Final   ??? Bilirubin, total 08/14/2016 0.2  0.2 - 1.0 MG/DL Final   ??? Bilirubin, direct 08/14/2016 0.1  0.0 - 0.2 MG/DL Final   ??? Alk. phosphatase 08/14/2016 226* 45 - 117 U/L Final   ??? AST (SGOT) 08/14/2016 42* 15 - 37 U/L Final   ??? ALT (SGPT) 08/14/2016 26  16 - 61 U/L Final   ??? Lipase 08/14/2016 292  73 - 393 U/L Final   ??? Sodium 08/14/2016 143  136 - 145 mmol/L Final   ??? Potassium 08/14/2016 3.7  3.5 - 5.5 mmol/L Final   ??? Chloride 08/14/2016 110* 100 - 108 mmol/L Final   ??? CO2 08/14/2016 25  21 - 32 mmol/L Final   ??? Anion gap 08/14/2016 8  3.0 - 18 mmol/L Final   ??? Glucose 08/14/2016 94  74 - 99 mg/dL Final   ??? BUN 08/14/2016 3* 7.0 - 18 MG/DL Final   ??? Creatinine 08/14/2016 0.44* 0.6 - 1.3 MG/DL Final   ??? BUN/Creatinine ratio 08/14/2016 7* 12 - 20   Final   ??? GFR est AA 08/14/2016 >60  >60 ml/min/1.55m Final   ??? GFR est non-AA 08/14/2016 >60  >60 ml/min/1.740mFinal    ??? Calcium 08/14/2016 7.5* 8.5 - 10.1 MG/DL Final   ??? ALCOHOL(ETHYL),SERUM 08/14/2016 312* 0 - 3 MG/DL Final   Admission on 08/13/2016, Discharged on 08/13/2016   Component Date Value Ref Range Status   ??? WBC 08/13/2016 6.7  4.6 - 13.2 K/uL Final   ??? RBC 08/13/2016 3.53* 4.70 - 5.50 M/uL Final   ??? HGB 08/13/2016 10.9* 13.0 - 16.0 g/dL Final   ??? HCT 08/13/2016 31.4* 36.0 - 48.0 % Final   ??? MCV 08/13/2016 89.0  74.0 - 97.0 FL Final   ??? [place]0/07/2016 30.9  24.0 - 34.0 PG Final   ??? MCHC 08/13/2016 34.7  31.0 - 37.0 g/dL Final   ??? RDW 08/13/2016 13.8  11.6 - 14.5 % Final   ??? PLATELET 08/13/2016 317  135 - 420 K/uL Final   ??? MPV 08/13/2016 8.5* 9.2 - 11.8 FL Final   ??? NEUTROPHILS 08/13/2016 38* 40 - 73 % Final   ??? LYMPHOCYTES 08/13/2016 50  21 - 52 % Final   ??? MONOCYTES 08/13/2016 8  3 - 10 % Final   ??? EOSINOPHILS 08/13/2016 3  0 - 5 % Final   ??? BASOPHILS 08/13/2016 1  0 - 2 % Final   ??? ABS. NEUTROPHILS 08/13/2016 2.5  1.8 - 8.0 K/UL Final   ??? ABS. LYMPHOCYTES 08/13/2016 3.4  0.9 - 3.6 K/UL Final   ??? ABS. MONOCYTES 08/13/2016 0.5  0.05 - 1.2 K/UL Final   ??? ABS. EOSINOPHILS 08/13/2016 0.2  0.0 - 0.4 K/UL Final   ??? ABS. BASOPHILS 08/13/2016 0.1* 0.0 - 0.06 K/UL Final   ??? DF 08/13/2016 AUTOMATED    Final   ??? Protein, total 08/13/2016 6.4  6.4 - 8.2 g/dL Final   ??? Albumin 08/13/2016 2.6* 3.4 - 5.0 g/dL Final   ??? Globulin 08/13/2016 3.8  2.0 - 4.0 g/dL Final   ??? A-G Ratio 08/13/2016  0.7* 0.8 - 1.7   Final   ??? Bilirubin, total 08/13/2016 0.3  0.2 - 1.0 MG/DL Final   ??? Bilirubin, direct 08/13/2016 <0.1  0.0 - 0.2 MG/DL Final   ??? Alk. phosphatase 08/13/2016 224* 45 - 117 U/L Final   ??? AST (SGOT) 08/13/2016 50* 15 - 37 U/L Final   ??? ALT (SGPT) 08/13/2016 29  16 - 61 U/L Final   ??? Lipase 08/13/2016 479* 73 - 393 U/L Final   ??? Sodium 08/13/2016 146* 136 - 145 mmol/L Final   ??? Potassium 08/13/2016 3.8  3.5 - 5.5 mmol/L Final   ??? Chloride 08/13/2016 107  100 - 108 mmol/L Final   ??? CO2 08/13/2016 27  21 - 32 mmol/L Final    ??? Anion gap 08/13/2016 12  3.0 - 18 mmol/L Final   ??? Glucose 08/13/2016 104* 74 - 99 mg/dL Final   ??? BUN 08/13/2016 2* 7.0 - 18 MG/DL Final   ??? Creatinine 08/13/2016 0.43* 0.6 - 1.3 MG/DL Final   ??? BUN/Creatinine ratio 08/13/2016 5* 12 - 20   Final   ??? GFR est AA 08/13/2016 >60  >60 ml/min/1.5m Final   ??? GFR est non-AA 08/13/2016 >60  >60 ml/min/1.776mFinal    Comment: (NOTE)  Estimated GFR is calculated using the Modification of Diet in Renal   Disease (MDRD) Study equation, reported for both African Americans   (GFRAA) and non-African Americans (GFRNA), and normalized to 1.731m body surface area. The physician must decide which value applies to   the patient. The MDRD study equation should only be used in   individuals age 49 68 older. It has not been validated for the   following: pregnant women, patients with serious comorbid conditions,   or on certain medications, or persons with extremes of body size,   muscle mass, or nutritional status.     ??? Calcium 08/13/2016 7.7* 8.5 - 10.1 MG/DL Final     EKG: normal rate, normal rhythm, normal PR/QT/QRS intervals, no ST changes    Assessment/Plan & Orders:         ICD-10-CM ICD-9-CM    1. Seizure (HCCTyler56.9 780.39    2. Normocytic anemia D64.9 285.9 CBC WITH AUTOMATED DIFF   3. Abnormal liver enzymes [id number] PANEL, COMPREHENSIVE   4. Screening for depression Z13.89 V79.0 PR DEPRESSION SCREEN ANNUAL      LIPID PANEL      TSH 3RD GENERATION      T4, FREE      HEMOGLOBIN A1C WITH EAG   5. Screening for prostate cancer Z12.5 V76.44 PSA, DIAGNOSTIC (PROSTATE SPECIFIC AG)   6. Tachycardia R00.0 785.0 AMB POC EKG ROUTINE W/ 12 LEADS, INTER & REP     HM  Colon cancer: Colonoscopy due at age 55 53yslipidemia: check fasting lipid panel and CMP  Diabetes mellitus: check FBG  Influenza vaccine: due, pt declines  Pneumococcal vaccine: due, indication is incarceration  Tdap: up to date  Herpes Zoster vaccine: due at age 31 80Hep B vaccine: not indicated (liver dz, DM 19-59)  Weight:  Body mass index is 19.79 kg/(m^2). Discussed the patient's BMI with him.  The BMI follow up plan is as follows: maintain healthy weight  Prostate cancer:  Discuss re: screening with PSA between ages 50 30d 69 66AA:  One-time abdominal US [place] current or former smoker aged 65 51 75 82ars   Osteoporosis:  No indication for dexa scan  Healthy lifestyle has been encouraged including avoidance of tobacco, limiting or avoiding alcohol intake, heart healthy diet which is low in cholesterol and saturated fat and contains fresh fruits, vegetables and whole grains and fiber, regular exercise with goals of 20-30 minutes 3-5 days weekly and maintaining an optimal BMI.   Pt asked to pick up medications. Pt has prescriptions  Will give information to be seen at [facility]       Follow-up Disposition:  Return in about 1 week (around 08/28/2016) for Go over lab/imaging results, Follow up.    *Patient verbalized understanding and agreement with the plan.  Patient was given an after-visit summary.    Satira Mccallum. [place], MD - Internal Medicine  08/21/2016, 9:53 AM  [facility]  [address] [place] [address]  Phone [phone]  Fax [phone]

## 2016-08-21 NOTE — Progress Notes (Signed)
Chief Complaint   Patient presents with   ??? Establish Care   ??? Depression   ??? Anxiety     Visit Vitals   ??? BP 111/71 (BP 1 Location: Left arm, BP Patient Position: Sitting)   ??? Pulse (!) 107   ??? Temp 98 ??F (36.7 ??C) (Oral)   ??? Resp 16   ??? Ht 5\' 6"  (1.676 m)   ??? Wt 122 lb 9.6 oz (55.6 kg)   ??? SpO2 95%   ??? BMI 19.79 kg/m2      Patient in room # 2. Patient is not fasting.     1. Have you been to the ER, urgent care clinic since your last visit?  Hospitalized since your last visit?Yes When: 08/16/2016 Where: [place] Reason for visit: seizure    2. Have you seen or consulted any other health care providers outside of the [facility] since your last visit?  Include any pap smears or colon screening. No    HM Reviewed. Flu declined.  Flowsheet, Learning Needs, and ADL completed.

## 2016-08-22 LAB — CBC WITH AUTOMATED DIFF
ABS. BASOPHILS: 0 10*3/uL (ref 0.0–0.06)
ABS. EOSINOPHILS: 0 10*3/uL (ref 0.0–0.4)
ABS. LYMPHOCYTES: 2.1 10*3/uL (ref 0.9–3.6)
ABS. MONOCYTES: 0.5 10*3/uL (ref 0.05–1.2)
ABS. NEUTROPHILS: 2.6 10*3/uL (ref 1.8–8.0)
BASOPHILS: 0 % (ref 0–2)
EOSINOPHILS: 0 % (ref 0–5)
HCT: 34 % — ABNORMAL LOW (ref 36.0–48.0)
HGB: 11.4 g/dL — ABNORMAL LOW (ref 13.0–16.0)
LYMPHOCYTES: 41 % (ref 21–52)
MCH: 31 PG (ref 24.0–34.0)
MCHC: 33.5 g/dL (ref 31.0–37.0)
MCV: 92.4 FL (ref 74.0–97.0)
MONOCYTES: 9 % (ref 3–10)
MPV: 8.8 FL — ABNORMAL LOW (ref 9.2–11.8)
NEUTROPHILS: 50 % (ref 40–73)
PLATELET: 132 10*3/uL — ABNORMAL LOW (ref 135–420)
RBC: 3.68 M/uL — ABNORMAL LOW (ref 4.70–5.50)
RDW: 15.4 % — ABNORMAL HIGH (ref 11.6–14.5)
WBC: 5.2 10*3/uL (ref 4.6–13.2)

## 2016-08-22 LAB — PSA, DIAGNOSTIC (PROSTATE SPECIFIC AG): Prostate Specific Ag: 1 ng/mL (ref 0.0–4.0)

## 2016-08-22 LAB — TSH 3RD GENERATION: TSH: 1.06 u[IU]/mL (ref 0.36–3.74)

## 2016-08-22 LAB — LIPID PANEL
CHOL/HDL Ratio: 1.3 (ref 0–5.0)
Cholesterol, total: 157 MG/DL (ref <200)
HDL Cholesterol: 121 MG/DL — ABNORMAL HIGH (ref 40–60)
LDL, calculated: 22.8 MG/DL (ref 0–100)
Triglyceride: 66 MG/DL (ref <150)
VLDL, calculated: 13.2 MG/DL

## 2016-08-22 LAB — METABOLIC PANEL, COMPREHENSIVE
A-G Ratio: 0.8 (ref 0.8–1.7)
ALT (SGPT): 26 U/L (ref 16–61)
AST (SGOT): 45 U/L — ABNORMAL HIGH (ref 15–37)
Albumin: 2.8 g/dL — ABNORMAL LOW (ref 3.4–5.0)
Alk. phosphatase: 224 U/L — ABNORMAL HIGH (ref 45–117)
Anion gap: 12 mmol/L (ref 3.0–18)
BUN/Creatinine ratio: 6 — ABNORMAL LOW (ref 12–20)
BUN: 3 MG/DL — ABNORMAL LOW (ref 7.0–18)
Bilirubin, total: 0.4 MG/DL (ref 0.2–1.0)
CO2: 22 mmol/L (ref 21–32)
Calcium: 7.7 MG/DL — ABNORMAL LOW (ref 8.5–10.1)
Chloride: 115 mmol/L — ABNORMAL HIGH (ref 100–108)
Creatinine: 0.53 MG/DL — ABNORMAL LOW (ref 0.6–1.3)
GFR est AA: >60 mL/min/{1.73_m2} (ref >60)
GFR est non-AA: >60 mL/min/{1.73_m2} (ref >60)
Globulin: 3.7 g/dL (ref 2.0–4.0)
Glucose: 80 mg/dL (ref 74–99)
Potassium: 3.5 mmol/L (ref 3.5–5.5)
Protein, total: 6.5 g/dL (ref 6.4–8.2)
Sodium: 149 mmol/L — ABNORMAL HIGH (ref 136–145)

## 2016-08-22 LAB — HEMOGLOBIN A1C WITH EAG: Hemoglobin A1c: 4.4 % (ref 4.2–5.6)

## 2016-08-22 LAB — T4, FREE: T4, Free: 1 NG/DL (ref 0.7–1.5)

## 2016-08-29 ENCOUNTER — Encounter: Attending: Internal Medicine | Primary: Internal Medicine

## 2016-09-19 DIAGNOSIS — I81 Portal vein thrombosis: Secondary | ICD-10-CM | POA: Insufficient documentation

## 2016-09-19 DIAGNOSIS — F10239 Alcohol dependence with withdrawal, unspecified: Secondary | ICD-10-CM

## 2016-09-19 DIAGNOSIS — E876 Hypokalemia: Secondary | ICD-10-CM

## 2016-09-19 DIAGNOSIS — F10939 Alcohol use, unspecified with withdrawal, unspecified: Secondary | ICD-10-CM

## 2016-09-19 DIAGNOSIS — K2920 Alcoholic gastritis without bleeding: Secondary | ICD-10-CM

## 2016-09-19 HISTORY — DX: Hypokalemia: E87.6

## 2016-09-19 HISTORY — DX: Portal vein thrombosis: I81

## 2016-09-19 HISTORY — DX: Alcohol dependence with withdrawal, unspecified: F10.239

## 2016-09-19 HISTORY — DX: Alcoholic gastritis without bleeding: K29.20

## 2016-09-19 HISTORY — DX: Hypomagnesemia: E83.42

## 2016-09-19 HISTORY — DX: Alcohol use, unspecified with withdrawal, unspecified: F10.939

## 2018-06-05 DIAGNOSIS — Z9289 Personal history of other medical treatment: Secondary | ICD-10-CM

## 2018-06-05 HISTORY — PX: ESOPHAGEAL VARICE LIGATION: SHX625

## 2018-06-05 HISTORY — DX: Personal history of other medical treatment: Z92.89

## 2018-07-02 DIAGNOSIS — E46 Unspecified protein-calorie malnutrition: Secondary | ICD-10-CM

## 2018-07-02 DIAGNOSIS — F10239 Alcohol dependence with withdrawal, unspecified: Secondary | ICD-10-CM

## 2018-07-02 HISTORY — DX: Unspecified protein-calorie malnutrition: E46

## 2018-07-02 HISTORY — DX: Alcohol dependence with withdrawal, unspecified: F10.239

## 2018-07-06 DIAGNOSIS — K253 Acute gastric ulcer without hemorrhage or perforation: Secondary | ICD-10-CM

## 2018-07-06 DIAGNOSIS — I499 Cardiac arrhythmia, unspecified: Secondary | ICD-10-CM

## 2018-07-06 DIAGNOSIS — D62 Acute posthemorrhagic anemia: Secondary | ICD-10-CM

## 2018-07-06 HISTORY — DX: Acute gastric ulcer without hemorrhage or perforation: K25.3

## 2018-07-06 HISTORY — DX: Cardiac arrhythmia, unspecified: I49.9

## 2018-07-06 HISTORY — DX: Acute posthemorrhagic anemia: D62

## 2018-07-21 DIAGNOSIS — K7011 Alcoholic hepatitis with ascites: Secondary | ICD-10-CM

## 2018-07-21 DIAGNOSIS — K709 Alcoholic liver disease, unspecified: Secondary | ICD-10-CM

## 2018-07-21 DIAGNOSIS — N5089 Other specified disorders of the male genital organs: Secondary | ICD-10-CM

## 2018-07-21 HISTORY — DX: Alcoholic hepatitis with ascites: K70.11

## 2018-07-21 HISTORY — DX: Alcoholic liver disease, unspecified: K70.9

## 2018-07-21 HISTORY — DX: Other specified disorders of the male genital organs: N50.89

## 2018-09-17 ENCOUNTER — Other Ambulatory Visit: Payer: Self-pay

## 2018-09-17 ENCOUNTER — Encounter (HOSPITAL_COMMUNITY): Payer: Self-pay | Admitting: Internal Medicine

## 2018-09-17 ENCOUNTER — Inpatient Hospital Stay (HOSPITAL_COMMUNITY)
Admission: AD | Admit: 2018-09-17 | Discharge: 2018-09-20 | DRG: 439 | Disposition: A | Discharge status: Home / Self Care | Payer: Medicaid Other | Source: Other Acute Inpatient Hospital | Attending: Internal Medicine | Admitting: Internal Medicine

## 2018-09-17 DIAGNOSIS — K860 Alcohol-induced chronic pancreatitis: Secondary | ICD-10-CM | POA: Diagnosis present

## 2018-09-17 DIAGNOSIS — K861 Other chronic pancreatitis: Secondary | ICD-10-CM

## 2018-09-17 DIAGNOSIS — K7030 Alcoholic cirrhosis of liver without ascites: Secondary | ICD-10-CM | POA: Diagnosis present

## 2018-09-17 DIAGNOSIS — R64 Cachexia: Secondary | ICD-10-CM | POA: Diagnosis present

## 2018-09-17 DIAGNOSIS — Z7141 Alcohol abuse counseling and surveillance of alcoholic: Secondary | ICD-10-CM | POA: Diagnosis not present

## 2018-09-17 DIAGNOSIS — F1020 Alcohol dependence, uncomplicated: Secondary | ICD-10-CM | POA: Diagnosis present

## 2018-09-17 DIAGNOSIS — K859 Acute pancreatitis without necrosis or infection, unspecified: Secondary | ICD-10-CM | POA: Diagnosis present

## 2018-09-17 DIAGNOSIS — K8689 Other specified diseases of pancreas: Secondary | ICD-10-CM | POA: Diagnosis present

## 2018-09-17 DIAGNOSIS — Z23 Encounter for immunization: Secondary | ICD-10-CM

## 2018-09-17 DIAGNOSIS — I10 Essential (primary) hypertension: Secondary | ICD-10-CM | POA: Diagnosis present

## 2018-09-17 DIAGNOSIS — Z91018 Allergy to other foods: Secondary | ICD-10-CM | POA: Diagnosis not present

## 2018-09-17 DIAGNOSIS — Z681 Body mass index (BMI) 19 or less, adult: Secondary | ICD-10-CM | POA: Diagnosis not present

## 2018-09-17 DIAGNOSIS — K8681 Exocrine pancreatic insufficiency: Secondary | ICD-10-CM | POA: Diagnosis present

## 2018-09-17 DIAGNOSIS — K219 Gastro-esophageal reflux disease without esophagitis: Secondary | ICD-10-CM | POA: Diagnosis present

## 2018-09-17 DIAGNOSIS — Z885 Allergy status to narcotic agent status: Secondary | ICD-10-CM | POA: Diagnosis not present

## 2018-09-17 DIAGNOSIS — Z87891 Personal history of nicotine dependence: Secondary | ICD-10-CM | POA: Diagnosis not present

## 2018-09-17 DIAGNOSIS — K449 Diaphragmatic hernia without obstruction or gangrene: Secondary | ICD-10-CM | POA: Diagnosis present

## 2018-09-17 DIAGNOSIS — K7460 Unspecified cirrhosis of liver: Secondary | ICD-10-CM | POA: Diagnosis present

## 2018-09-17 DIAGNOSIS — F411 Generalized anxiety disorder: Secondary | ICD-10-CM | POA: Diagnosis present

## 2018-09-17 HISTORY — DX: Panic disorder (episodic paroxysmal anxiety): F41.0

## 2018-09-17 HISTORY — DX: Headache, unspecified: R51.9

## 2018-09-17 HISTORY — DX: Melena: K92.1

## 2018-09-17 HISTORY — DX: Tremor, unspecified: R25.1

## 2018-09-17 HISTORY — DX: Chest pain, unspecified: R07.9

## 2018-09-17 HISTORY — DX: Essential (primary) hypertension: I10

## 2018-09-17 HISTORY — DX: Conjunctival hemorrhage, right eye: H11.31

## 2018-09-17 HISTORY — DX: Alcohol dependence with withdrawal, unspecified: F10.239

## 2018-09-17 HISTORY — DX: Alcohol-induced chronic pancreatitis: K86.0

## 2018-09-17 HISTORY — DX: Altered mental status, unspecified: R41.82

## 2018-09-17 HISTORY — DX: Headache: R51

## 2018-09-17 HISTORY — DX: Alcoholic liver disease, unspecified: K70.9

## 2018-09-17 HISTORY — DX: Coagulation defect, unspecified: D68.9

## 2018-09-17 HISTORY — DX: Gastro-esophageal reflux disease without esophagitis: K21.9

## 2018-09-17 HISTORY — DX: Unspecified convulsions: R56.9

## 2018-09-17 HISTORY — DX: Cardiac arrhythmia, unspecified: I49.9

## 2018-09-17 HISTORY — DX: Alcohol dependence, uncomplicated: F10.20

## 2018-09-17 HISTORY — DX: Personal history of other diseases of the digestive system: Z87.19

## 2018-09-17 HISTORY — DX: Other specified diseases of pancreas: K86.89

## 2018-09-17 HISTORY — DX: Other specified disorders of the male genital organs: N50.89

## 2018-09-17 HISTORY — DX: Portal vein thrombosis: I81

## 2018-09-17 HISTORY — DX: Unspecified protein-calorie malnutrition: E46

## 2018-09-17 HISTORY — DX: Hypomagnesemia: E83.42

## 2018-09-17 HISTORY — DX: Family history of diseases of the blood and blood-forming organs and certain disorders involving the immune mechanism: Z83.2

## 2018-09-17 HISTORY — DX: Acute pancreatitis without necrosis or infection, unspecified: K85.90

## 2018-09-17 HISTORY — DX: Alcoholic hepatitis with ascites: K70.11

## 2018-09-17 HISTORY — DX: Hypokalemia: E87.6

## 2018-09-17 HISTORY — DX: Alcoholic gastritis without bleeding: K29.20

## 2018-09-17 HISTORY — DX: Personal history of other medical treatment: Z92.89

## 2018-09-17 HISTORY — DX: Acute gastric ulcer without hemorrhage or perforation: K25.3

## 2018-09-17 HISTORY — DX: Other chronic pancreatitis: K86.1

## 2018-09-17 HISTORY — DX: Acute posthemorrhagic anemia: D62

## 2018-09-17 LAB — CBC WITH DIFFERENTIAL/PLATELET
ABS IMMATURE GRANULOCYTES: 0.04 10*3/uL (ref 0.00–0.07)
BASOS PCT: 0 %
Basophils Absolute: 0 10*3/uL (ref 0.0–0.1)
EOS ABS: 0.1 10*3/uL (ref 0.0–0.5)
Eosinophils Relative: 1 %
HCT: 37.7 % — ABNORMAL LOW (ref 39.0–52.0)
Hemoglobin: 12.7 g/dL — ABNORMAL LOW (ref 13.0–17.0)
Immature Granulocytes: 0 %
LYMPHS ABS: 2.2 10*3/uL (ref 0.7–4.0)
Lymphocytes Relative: 22 %
MCH: 30.5 pg (ref 26.0–34.0)
MCHC: 33.7 g/dL (ref 30.0–36.0)
MCV: 90.6 fL (ref 80.0–100.0)
MONO ABS: 1.2 10*3/uL — AB (ref 0.1–1.0)
MONOS PCT: 12 %
Neutro Abs: 6.3 10*3/uL (ref 1.7–7.7)
Neutrophils Relative %: 65 %
PLATELETS: 193 10*3/uL (ref 150–400)
RBC: 4.16 MIL/uL — ABNORMAL LOW (ref 4.22–5.81)
RDW: 12.2 % (ref 11.5–15.5)
WBC: 9.8 10*3/uL (ref 4.0–10.5)
nRBC: 0 % (ref 0.0–0.2)

## 2018-09-17 LAB — BASIC METABOLIC PANEL
Anion gap: 7 (ref 5–15)
BUN: 8 mg/dL (ref 6–20)
CALCIUM: 8.7 mg/dL — AB (ref 8.9–10.3)
CO2: 24 mmol/L (ref 22–32)
Chloride: 103 mmol/L (ref 98–111)
Creatinine, Ser: 0.55 mg/dL — ABNORMAL LOW (ref 0.61–1.24)
GFR calc Af Amer: >60 mL/min (ref >60)
GLUCOSE: 100 mg/dL — AB (ref 70–99)
Potassium: 3.6 mmol/L (ref 3.5–5.1)
Sodium: 134 mmol/L — ABNORMAL LOW (ref 135–145)

## 2018-09-17 MED ORDER — THIAMINE HCL 100 MG/ML IJ SOLN
100.0000 mg | Freq: Every day | INTRAMUSCULAR | Status: DC
  Administered 2018-09-18 – 2018-09-19 (×2): 100 mg via INTRAVENOUS
  Filled 2018-09-17 (×2): qty 2

## 2018-09-17 MED ORDER — ONDANSETRON HCL 4 MG/2ML IJ SOLN: 4.0000 mg | Freq: Four times a day (QID) | INTRAMUSCULAR | Status: DC | PRN

## 2018-09-17 MED ORDER — ORAL CARE MOUTH RINSE
15.0000 mL | Freq: Two times a day (BID) | OROMUCOSAL | Status: DC
  Administered 2018-09-18 – 2018-09-20 (×2): 15 mL via OROMUCOSAL

## 2018-09-17 MED ORDER — ONDANSETRON HCL 4 MG PO TABS: 4.0000 mg | ORAL_TABLET | Freq: Four times a day (QID) | ORAL | Status: DC | PRN

## 2018-09-17 MED ORDER — DEXTROSE IN LACTATED RINGERS 5 % IV SOLN
INTRAVENOUS | Status: DC
  Administered 2018-09-17 – 2018-09-19 (×5): via INTRAVENOUS

## 2018-09-17 MED ORDER — INFLUENZA VAC SPLIT QUAD 0.5 ML IM SUSY
0.5000 mL | PREFILLED_SYRINGE | INTRAMUSCULAR | Status: AC
  Administered 2018-09-19: 0.5 mL via INTRAMUSCULAR
  Filled 2018-09-17: qty 0.5

## 2018-09-17 MED ORDER — CHLORHEXIDINE GLUCONATE 0.12 % MT SOLN
15.0000 mL | Freq: Two times a day (BID) | OROMUCOSAL | Status: DC
  Administered 2018-09-18 – 2018-09-19 (×4): 15 mL via OROMUCOSAL
  Filled 2018-09-17 (×5): qty 15

## 2018-09-17 MED ORDER — PNEUMOCOCCAL VAC POLYVALENT 25 MCG/0.5ML IJ INJ
0.5000 mL | INJECTION | INTRAMUSCULAR | Status: AC
  Administered 2018-09-19: 0.5 mL via INTRAMUSCULAR
  Filled 2018-09-17 (×2): qty 0.5

## 2018-09-17 MED ORDER — MORPHINE SULFATE (PF) 4 MG/ML IV SOLN: 4.0000 mg | INTRAVENOUS | Status: DC | PRN

## 2018-09-17 MED ORDER — FOLIC ACID 5 MG/ML IJ SOLN
1.0000 mg | Freq: Every day | INTRAMUSCULAR | Status: DC
  Administered 2018-09-18 – 2018-09-19 (×2): 1 mg via INTRAVENOUS
  Filled 2018-09-17 (×2): qty 0.2

## 2018-09-17 MED ORDER — KETOROLAC TROMETHAMINE 30 MG/ML IJ SOLN
30.0000 mg | Freq: Four times a day (QID) | INTRAMUSCULAR | Status: DC | PRN
  Administered 2018-09-17 – 2018-09-19 (×5): 30 mg via INTRAVENOUS
  Filled 2018-09-17 (×5): qty 1

## 2018-09-17 MED ORDER — ENOXAPARIN SODIUM 40 MG/0.4ML ~~LOC~~ SOLN
40.0000 mg | SUBCUTANEOUS | Status: DC
  Administered 2018-09-17: 40 mg via SUBCUTANEOUS
  Filled 2018-09-17: qty 0.4

## 2018-09-17 NOTE — H&P (Addendum)
History and Physical   Todd Coffey UJW:[id number] DOB: 11/07/1960 DOA: 09/17/2018  [facility]PA[facility]  PCP: No primary care provider on file.   Outpatient Specialists: None  [facility]  Chief Complaint: Abdominal pain  HPI: Todd Coffey is a 57 y.o. male with medical history significant of chronic alcoholism who used to reside in [place] but moved over here by sister, history of liver cirrhosis, possible history of esophageal varices, who [facility] with no specific abdominal pain.  Patient has had pancreatitis in the past apparently.  Nothing recently.  He was seen in the ER and evaluated.  He was found to have mildly elevated alkaline phosphatase of 125 other LFTs are within normal.  Vitals were normal at the time.  His lipase was 464.  Creatinine was normal at 0.4.  Patient had CT abdomen that showed acute on chronic pancreatitis but couple of masses in the head of pancreas with dilated ducts.  Dr. Matthias HughsBuccini from GI was consulted and recommended transferring patient here for GI follow-up.  Patient is still complaining of abdominal pain and some nausea.  Denied any melena denied any bright red blood per rectum but denied any significant vomiting at this point. Patient also reported multiple Pancreatic cysts in the past necessitating drainage percutaneously in [place]. No recent alcohol intake in the last week.  ED Course: As above [facility]  Review of Systems: As per HPI otherwise 10 point review of systems negative.    Past Medical History:  Diagnosis Date  . Cirrhosis (HCC)     History reviewed. No pertinent surgical history.   has no tobacco, alcohol, and drug history on file.  Allergies not on file  History reviewed. No pertinent family history.   Prior to Admission medications   Not on File    Physical Exam: Vitals:   09/17/18 2129  BP: 113/80  Pulse: 90  Resp: 18  Temp: 98.3 F  (36.8 C)  TempSrc: Oral  SpO2: 100%  Weight: 52.7 kg  Height: 5\' 6"  (1.676 m)      Constitutional: NAD, calm, comfortable, chronically ill looking Vitals:   09/17/18 2129  BP: 113/80  Pulse: 90  Resp: 18  Temp: 98.3 F (36.8 C)  TempSrc: Oral  SpO2: 100%  Weight: 52.7 kg  Height: 5\' 6"  (1.676 m)   Eyes: PERRL, lids and conjunctivae normal ENMT: Mucous membranes are moist. Posterior pharynx clear of any exudate or lesions.Normal dentition.  Neck: normal, supple, no masses, no thyromegaly Respiratory: clear to auscultation bilaterally, no wheezing, no crackles. Normal respiratory effort. No accessory muscle use.  Cardiovascular: Regular rate and rhythm, no murmurs / rubs / gallops. No extremity edema. 2+ pedal pulses. No carotid bruits.  Abdomen: Diffuse tenderness, no masses palpated. No appreciate hepatosplenomegaly. Bowel sounds positive.  Musculoskeletal: no clubbing / cyanosis. No joint deformity upper and lower extremities. Good ROM, no contractures. Normal muscle tone.  Skin: no rashes, lesions, ulcers. No induration Neurologic: CN 2-12 grossly intact. Sensation intact, DTR normal. Strength 5/5 in all 4.  Psychiatric: Normal judgment and insight. Alert and oriented x 3. Normal mood.     Labs on Admission: I have personally reviewed following labs and imaging studies  CBC: No results for input(s): WBC, NEUTROABS, HGB, HCT, MCV, PLT in the last 168 hours. Basic Metabolic Panel: No results for input(s): NA, K, CL, CO2, GLUCOSE, BUN, CREATININE, CALCIUM, MG, PHOS in the last 168 hours. GFR: CrCl cannot be calculated (No  successful lab value found.). Liver Function Tests: No results for input(s): AST, ALT, ALKPHOS, BILITOT, PROT, ALBUMIN in the last 168 hours. No results for input(s): LIPASE, AMYLASE in the last 168 hours. No results for input(s): AMMONIA in the last 168 hours. Coagulation Profile: No results for input(s): INR, PROTIME in the last 168 hours. Cardiac  Enzymes: No results for input(s): CKTOTAL, CKMB, CKMBINDEX, TROPONINI in the last 168 hours. BNP (last 3 results) No results for input(s): PROBNP in the last 8760 hours. HbA1C: No results for input(s): HGBA1C in the last 72 hours. CBG: No results for input(s): GLUCAP in the last 168 hours. Lipid Profile: No results for input(s): CHOL, HDL, LDLCALC, TRIG, CHOLHDL, LDLDIRECT in the last 72 hours. Thyroid Function Tests: No results for input(s): TSH, T4TOTAL, FREET4, T3FREE, THYROIDAB in the last 72 hours. Anemia Panel: No results for input(s): VITAMINB12, FOLATE, FERRITIN, TIBC, IRON, RETICCTPCT in the last 72 hours. Urine analysis: No results found for: COLORURINE, APPEARANCEUR, LABSPEC, PHURINE, GLUCOSEU, HGBUR, BILIRUBINUR, KETONESUR, PROTEINUR, UROBILINOGEN, NITRITE, LEUKOCYTESUR Sepsis Labs: @LABRCNTIP (procalcitonin:4,lacticidven:4) )No results found for this or any previous visit (from the past 240 hour(s)).   Radiological Exams on Admission: No results found.   Assessment/Plan Principal Problem:   Acute on chronic pancreatitis [facility]) Active Problems:   Alcoholism (HCC)   Liver cirrhosis (HCC)   Pancreatic mass     #1 acute on chronic pancreatitis: Patient will be admitted with plan for pain control.  Give lactated Ringer's's IV.  Nausea management.  GI will see patient in the morning and make a decision.  #2 history of alcoholism: Thiamine and folic acid.  No obvious evidence of active drinking.  CIWA protocol will be activated if signs of withdrawal  #3 pancreatic masses: Again GI will follow with patient and make necessary recommendations.  Pain management and supportive care only from [place].  #4 liver cirrhosis: Secondary to alcoholism.  LFTs appear normal.  No evidence of portal hypertension.  Continue monitoring and continue per GI   DVT prophylaxis: SCD Code Status: Full Family Communication: Sister who accompanied patient Disposition Plan: To be  determined Consults called: Dr. Matthias Hughs from gastroenterology Admission status: Inpatient  Severity of Illness: The appropriate patient status for this patient is INPATIENT. Inpatient status is judged to be reasonable and necessary in order to provide the required intensity of service to ensure the patient's safety. The patient's presenting symptoms, physical exam findings, and initial radiographic and laboratory data in the context of their chronic comorbidities is felt to place them at high risk for further clinical deterioration. Furthermore, it is not anticipated that the patient will be medically stable for discharge from the hospital within 2 midnights of admission. The following factors support the patient status of inpatient.   " The patient's presenting symptoms include abdominal pain with nausea. " The worrisome physical exam findings include cachexia and abdominal tenderness. " The initial radiographic and laboratory data are worrisome because of pancreatic masses seen on CT scan. " The chronic co-morbidities include chronic alcoholism.   * I certify that at the point of admission it is my clinical judgment that the patient will require inpatient hospital care spanning beyond 2 midnights from the point of admission due to high intensity of service, high risk for further deterioration and high frequency of surveillance required.Lonia Blood MD [facility] Pager [phone]  If 7PM-7AM, please contact night-coverage [email or web address] Password [email or web address]  09/17/2018, 10:06 PM

## 2018-09-17 NOTE — Progress Notes (Signed)
Pt arrived from [place] hospital. Paged admissions for orders.

## 2018-09-17 NOTE — Progress Notes (Signed)
Pt is A&Ox4. Pt lives at home with sister. Pt's skin is dry, warm and intact. Pt told to call for assistance before getting OOB, pt stated understanding. Will continue to monitor pt. Nelda MarseilleJenny Thacker, RN

## 2018-09-18 ENCOUNTER — Inpatient Hospital Stay (HOSPITAL_COMMUNITY): Payer: Medicaid Other

## 2018-09-18 LAB — COMPREHENSIVE METABOLIC PANEL
ALBUMIN: 2.9 g/dL — AB (ref 3.5–5.0)
ALT: 22 U/L (ref 0–44)
ALT: 20 U/L (ref 0–44)
AST: 35 U/L (ref 15–41)
AST: 29 U/L (ref 15–41)
Albumin: 2.6 g/dL — ABNORMAL LOW (ref 3.5–5.0)
Alkaline Phosphatase: 106 U/L (ref 38–126)
Alkaline Phosphatase: 102 U/L (ref 38–126)
Anion gap: 7 (ref 5–15)
Anion gap: 5 (ref 5–15)
BUN: 10 mg/dL (ref 6–20)
BUN: 10 mg/dL (ref 6–20)
CALCIUM: 8.3 mg/dL — AB (ref 8.9–10.3)
CHLORIDE: 103 mmol/L (ref 98–111)
CO2: 23 mmol/L (ref 22–32)
CO2: 24 mmol/L (ref 22–32)
CREATININE: 0.54 mg/dL — AB (ref 0.61–1.24)
Calcium: 8.6 mg/dL — ABNORMAL LOW (ref 8.9–10.3)
Chloride: 105 mmol/L (ref 98–111)
Creatinine, Ser: 0.65 mg/dL (ref 0.61–1.24)
GFR calc Af Amer: >60 mL/min (ref >60)
GFR calc non Af Amer: >60 mL/min (ref >60)
GFR calc non Af Amer: >60 mL/min (ref >60)
GLUCOSE: 99 mg/dL (ref 70–99)
Glucose, Bld: 112 mg/dL — ABNORMAL HIGH (ref 70–99)
POTASSIUM: 3.8 mmol/L (ref 3.5–5.1)
Potassium: 3.7 mmol/L (ref 3.5–5.1)
SODIUM: 133 mmol/L — AB (ref 135–145)
SODIUM: 134 mmol/L — AB (ref 135–145)
Total Bilirubin: 0.7 mg/dL (ref 0.3–1.2)
Total Bilirubin: 0.6 mg/dL (ref 0.3–1.2)
Total Protein: 6.6 g/dL (ref 6.5–8.1)
Total Protein: 5.6 g/dL — ABNORMAL LOW (ref 6.5–8.1)

## 2018-09-18 LAB — MAGNESIUM: MAGNESIUM: 1.6 mg/dL — AB (ref 1.7–2.4)

## 2018-09-18 LAB — HIV ANTIBODY (ROUTINE TESTING W REFLEX): HIV SCREEN 4TH GENERATION: NONREACTIVE

## 2018-09-18 LAB — CBC
HCT: 37 % — ABNORMAL LOW (ref 39.0–52.0)
HEMATOCRIT: 39.2 % (ref 39.0–52.0)
HEMOGLOBIN: 13 g/dL (ref 13.0–17.0)
Hemoglobin: 11.9 g/dL — ABNORMAL LOW (ref 13.0–17.0)
MCH: 30.2 pg (ref 26.0–34.0)
MCH: 29.8 pg (ref 26.0–34.0)
MCHC: 33.2 g/dL (ref 30.0–36.0)
MCHC: 32.2 g/dL (ref 30.0–36.0)
MCV: 91.2 fL (ref 80.0–100.0)
MCV: 92.5 fL (ref 80.0–100.0)
NRBC: 0 % (ref 0.0–0.2)
PLATELETS: 177 10*3/uL (ref 150–400)
Platelets: 139 10*3/uL — ABNORMAL LOW (ref 150–400)
RBC: 4.3 MIL/uL (ref 4.22–5.81)
RBC: 4 MIL/uL — AB (ref 4.22–5.81)
RDW: 12.4 % (ref 11.5–15.5)
RDW: 12.4 % (ref 11.5–15.5)
WBC: 9.4 10*3/uL (ref 4.0–10.5)
WBC: 8.7 10*3/uL (ref 4.0–10.5)
nRBC: 0 % (ref 0.0–0.2)

## 2018-09-18 LAB — LIPID PANEL
CHOLESTEROL: 86 mg/dL (ref 0–200)
HDL: 32 mg/dL — ABNORMAL LOW (ref >40)
LDL CALC: 45 mg/dL (ref 0–99)
TRIGLYCERIDES: 46 mg/dL (ref <150)
Total CHOL/HDL Ratio: 2.7 RATIO
VLDL: 9 mg/dL (ref 0–40)

## 2018-09-18 LAB — LIPASE, BLOOD: Lipase: 48 U/L (ref 11–51)

## 2018-09-18 LAB — GLUCOSE, CAPILLARY: Glucose-Capillary: 102 mg/dL — ABNORMAL HIGH (ref 70–99)

## 2018-09-18 MED ORDER — SODIUM CHLORIDE 0.9 % IV SOLN: INTRAVENOUS | Status: DC

## 2018-09-18 MED ORDER — HEPARIN SODIUM (PORCINE) 5000 UNIT/ML IJ SOLN
5000.0000 [IU] | Freq: Three times a day (TID) | INTRAMUSCULAR | Status: DC
  Administered 2018-09-18 – 2018-09-20 (×7): 5000 [IU] via SUBCUTANEOUS
  Filled 2018-09-18 (×7): qty 1

## 2018-09-18 MED ORDER — GADOBUTROL 1 MMOL/ML IV SOLN
5.0000 mL | Freq: Once | INTRAVENOUS | Status: AC | PRN
  Administered 2018-09-18: 5 mL via INTRAVENOUS

## 2018-09-18 NOTE — Progress Notes (Signed)
PROGRESS NOTE                                                                                                                                                                                                             Patient Demographics:    Todd Coffey, is a 57 y.o. male, DOB - 1961/08/26, [id number]  Admit date - 09/17/2018   Admitting Physician Elyse Hsu, MD  Outpatient Primary MD for the patient is No primary care provider on file.  LOS - 1  CC Abd Pain     Brief Narrative  Todd Coffey is a 57 y.o. male with medical history significant of chronic alcoholism who used to reside in [place] but moved over here by sister, history of alcoholic liver cirrhosis with varices, alcoholic pancreatitis with pseudocyst who has recently moved from [place] to [place] to live with his sister presented to [facility] with epigastric abdominal pain he was diagnosed with recurrent pancreatitis with a suspicious pancreatic mass and transferred here for further work-up and care.  Subjective  - he has No headache, No chest pain, Minimal epigastric abdominal pain - No Nausea, No new weakness tingling or numbness, No Cough - SOB.     Assessment  & Plan :     1. Abdominal pain due to acute on chronic alcoholic pancreatitis with suspicion for pancreatic mass versus pseudocyst.  GI on board, being treated conservatively with bowel rest, IV fluids and pain control, due for abdominal MRI ordered by GI.  Will monitor.   2.  HX of alcohol abuse, alcoholic cirrhosis with varices.  No acute issues, last drink over 2 months ago, counseled to continue to abstain.  Monitor with supportive care.   Note med rec will be done and reviewed.    Family Communication  : None  Code Status :  Full  Disposition Plan  :  Med  Consults  :  GI  Procedures  :    MRI -   DVT Prophylaxis  :    Heparin    Lab Results  Component Value Date   PLT 177 09/18/2018    Diet :    Diet Order            Diet NPO time specified  Diet effective midnight               Inpatient Medications Scheduled Meds: . chlorhexidine  15 mL Mouth Rinse BID  . folic acid  1 mg Intravenous Daily  .  heparin injection (subcutaneous)  5,000 Units Subcutaneous Q8H  . Influenza vac split quadrivalent PF  0.5 mL Intramuscular Tomorrow-1000  . mouth rinse  15 mL Mouth Rinse q12n4p  . pneumococcal 23 valent vaccine  0.5 mL Intramuscular Tomorrow-1000  . thiamine injection  100 mg Intravenous Daily   Continuous Infusions: . dextrose 5% lactated ringers 100 mL/hr at 09/18/18 0817   PRN Meds:.ketorolac, ondansetron **OR** ondansetron (ZOFRAN) IV  Antibiotics  :   Anti-infectives (From admission, onward)   None          Objective:   Vitals:   09/17/18 2129 09/18/18 0528  BP: 113/80 115/79  Pulse: 90 80  Resp: 18 20  Temp: 98.3 F (36.8 C) 98.8 F (37.1 C)  TempSrc: Oral Oral  SpO2: 100% 100%  Weight: 52.7 kg   Height: 5\' 6"  (1.676 m)     Wt Readings from Last 3 Encounters:  09/17/18 52.7 kg     Intake/Output Summary (Last 24 hours) at 09/18/2018 1037 Last data filed at 09/18/2018 0814 Gross per 24 hour  Intake 1390.71 ml  Output -  Net 1390.71 ml     Physical Exam  Awake Alert, Oriented X 3, No new F.N deficits, Normal affect [place].AT,PERRAL Supple Neck,No JVD, No cervical lymphadenopathy appriciated.  Symmetrical Chest wall movement, Good air movement bilaterally, CTAB RRR,No Gallops,Rubs or new Murmurs, No Parasternal Heave +ve B.Sounds, Abd Soft,minimal epigastric tenderness, No organomegaly appriciated, No rebound - guarding or rigidity. No Cyanosis, Clubbing or edema, No new Rash or bruise       Data Review:    CBC Recent Labs  Lab 09/17/18 2210 09/18/18 0533 09/18/18 0758  WBC 9.8 9.4 8.7  HGB 12.7* 13.0 11.9*  HCT 37.7* 39.2 37.0*  PLT 193 139* 177  MCV 90.6 91.2 92.5  MCH 30.5 30.2 29.8  MCHC 33.7 33.2 32.2  RDW 12.2 12.4 12.4   LYMPHSABS 2.2  --   --   MONOABS 1.2*  --   --   EOSABS 0.1  --   --   BASOSABS 0.0  --   --     Chemistries  Recent Labs  Lab 09/17/18 2210 09/18/18 0533 09/18/18 0758  NA 134* 133* 134*  K 3.6 3.8 3.7  CL 103 103 105  CO2 24 23 24   GLUCOSE 100* 99 112*  BUN 8 10 10   CREATININE 0.55* 0.65 0.54*  CALCIUM 8.7* 8.6* 8.3*  MG  --   --  1.6*  AST  --  35 29  ALT  --  22 20  ALKPHOS  --  106 102  BILITOT  --  0.7 0.6   ------------------------------------------------------------------------------------------------------------------ Recent Labs    09/18/18 0758  CHOL 86  HDL 32*  LDLCALC 45  TRIG 46  CHOLHDL 2.7    No results found for: HGBA1C ------------------------------------------------------------------------------------------------------------------ No results for input(s): TSH, T4TOTAL, T3FREE, THYROIDAB in the last 72 hours.  Invalid input(s): FREET3 ------------------------------------------------------------------------------------------------------------------ No results for input(s): VITAMINB12, FOLATE, FERRITIN, TIBC, IRON, RETICCTPCT in the last 72 hours.  Coagulation profile No results for input(s): INR, PROTIME in the last 168 hours.  No results for input(s): DDIMER in the last 72 hours.  Cardiac Enzymes No results for input(s): CKMB, TROPONINI, MYOGLOBIN in the last 168 hours.  Invalid input(s): CK ------------------------------------------------------------------------------------------------------------------ No results found for: BNP  Micro Results No results found for this or any previous visit (from the past 240 hour(s)).  Radiology Reports No results found.  Time Spent in minutes  30  Susa Raring M.D on 09/18/2018 at 10:37 AM  To page go to [email or web address] - password [email or web address]

## 2018-09-18 NOTE — Progress Notes (Signed)
Completed Regular Advance Directive and additional Mental Health AD. Phebe CollaDonna S [place], Chaplain   09/18/18 1500  Clinical Encounter Type  Visited With Patient;Other (Comment) (caregiver)  Visit Type Other (Comment) (Completed Advance Directives Mental health and regular AD)  Referral From Nurse  Consult/Referral To Chaplain  Spiritual Encounters  Spiritual Needs Other (Comment) (just wanted forms completed)  Advance Directives (For Healthcare)  Does Patient Have a Medical Advance Directive? Yes  Mental Health Advance Directives  Does Patient Have a Mental Health Advance Directive? Yes

## 2018-09-18 NOTE — Consult Note (Signed)
Referring Provider: Dr. Thedore Mins Primary Care Physician:  No primary care provider on file. Primary Gastroenterologist:  UNASSIGNED  Reason for Consultation:  Pancreatitis; Abdominal pain  HPI: Todd Coffey is a 57 y.o. 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 He reports that he has cirrhosis as well. He reports recurrent acute on chronic pancreatitis over the years and reports having an upper GI bleed (he does not know if it was a variceal bleed but Epic reports variceal banding was done) in September that he reports was due to "liver disease" in [place] where he used to live. He moved to [place] recently to be with his sister and starting this past Sunday he developed diffuse abdominal pain that was sharp and felt like the beginning of all of his previous episodes. Had associated nausea and nonbloody vomiting. Denies fevers. Reports stopping alcohol use completely in September when he was hospitalized. Went to [facility] yesterday for the abdominal pain and Lipase 464 with ALP 125 and other LFTs reportedly normal. A CT scan reportedly showed acute on chronic pancreatitis and 2 pancreatic head masses with dilated ducts but report cannot be found in Epic. LFTs within normal limits here.  Past Medical History:  Diagnosis Date  . Acute on chronic pancreatitis (HCC)    hx of pancreatitis; nothing recently til today /notes 09/17/2018 (09/17/2018)  . Acute stomach ulcer 07/2018  . Alcohol dependence with withdrawal (HCC) 07/02/2018   w/complications  . Alcohol withdrawal (HCC) 09/19/2016  . Alcohol-induced chronic pancreatitis (HCC) 09/26/2014; 09/19/2016  . 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 Alcoholic liver disease (HCC) 07/21/2018  . Altered mental status 05/06/2013  . Anemia due to acute blood loss 07/2018  . Anxiety attack   . Ascites due to alcoholic hepatitis  07/21/2018  . Chest pain 11/09/2013   "have had chest pains several times" (09/17/2018)  . Chronic alcoholism (HCC)    Hattie Perch 09/17/2018  . Coagulopathy (HCC) unknown  . Family history of von Willebrand disease    "sister, [name]" (09/17/2018)  . Gastrointestinal hemorrhage with melena unknown  . GERD (gastroesophageal reflux disease)   . Headache    "weekly" (09/17/2018)  . History of blood transfusion 06/2018   "10 units in 8 h; probably had some before" (09/17/2018)  . History of cirrhosis of liver    /notes 09/17/2018  . History of hiatal hernia   . Hypertension   . Hypokalemia 09/19/2016  . Hypomagnesemia 09/19/2016  . Irregular heart rate 07/2018  . Pancreatic mass    pancreatic masses/notes 09/17/2018  . 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 Protein malnutrition (HCC) 07/02/2018   chronic  . Scrotal edema 07/21/2018  . Seizure (HCC)    "since ~ 2002; several; controlled w/RX" (09/17/2018)  . Subconjunctival hemorrhage of right eye 06/14/2016  . Tremor 09/26/2014    Past Surgical History:  Procedure Laterality Date  . COLONOSCOPY    . ESOPHAGEAL VARICE LIGATION  06/2018  . ESOPHAGOGASTRODUODENOSCOPY    . HEMORRHOID SURGERY    . INGUINAL HERNIA REPAIR Right X 2  . PANCREAS SURGERY     "cyst"    Prior to Admission medications   Not on File    Scheduled Meds: . chlorhexidine  15 mL Mouth Rinse BID  . folic acid  1 mg Intravenous Daily  . heparin injection (subcutaneous)  5,000 Units Subcutaneous Q8H  . Influenza vac split quadrivalent  PF  0.5 mL Intramuscular Tomorrow-1000  . mouth rinse  15 mL Mouth Rinse q12n4p  . pneumococcal 23 valent vaccine  0.5 mL Intramuscular Tomorrow-1000  . thiamine injection  100 mg Intravenous Daily   Continuous Infusions: . dextrose 5% lactated ringers 100 mL/hr at 09/18/18 0817   PRN Meds:.ketorolac, ondansetron **OR** ondansetron (ZOFRAN) IV  Allergies as of 09/17/2018 - Review Complete 09/17/2018  Allergen Reaction  Noted  . Morphine and related Other (See Comments) 09/17/2018  . Peach [prunus persica] Hives 09/17/2018    History reviewed. No pertinent family history.  Social History   Socioeconomic History  . Marital status: Single    Spouse name: Not on file  . Number of children: Not on file  . Years of education: Not on file  . Highest education level: Not on file  Occupational History  . Not on file  Social Needs  . Financial resource strain: Not on file  . Food insecurity:    Worry: Not on file    Inability: Not on file  . Transportation needs:    Medical: Not on file    Non-medical: Not on file  Tobacco Use  . Smoking status: Former Smoker    Packs/day: 0.33    Years: 45.00    Pack years: 14.85    Types: Cigarettes  . Smokeless tobacco: Former Neurosurgeon  . Tobacco comment: 09/17/2018 "nothing in over 1 yr"  Substance and Sexual Activity  . Alcohol use: Not Currently    Comment: 09/17/2018 "nothing since 06/30/2018"  . Drug use: Yes    Types: Marijuana    Comment: 09/17/2018 "I've done ~ qthing but heroin; last drug use was marijuana in ~ 03/2018"   . Sexual activity: Not Currently  Lifestyle  . Physical activity:    Days per week: Not on file    Minutes per session: Not on file  . Stress: Not on file  Relationships  . Social connections:    Talks on phone: Not on file    Gets together: Not on file    Attends religious service: Not on file    Active member of club or organization: Not on file    Attends meetings of clubs or organizations: Not on file    Relationship status: Not on file  . Intimate partner violence:    Fear of current or ex partner: Not on file    Emotionally abused: Not on file    Physically abused: Not on file    Forced sexual activity: Not on file  Other Topics Concern  . Not on file  Social History Narrative  . Not on file    Review of Systems: All negative except as stated above in HPI.  Physical Exam: Vital signs: Vitals:   09/17/18 2129  09/18/18 0528  BP: 113/80 115/79  Pulse: 90 80  Resp: 18 20  Temp: 98.3 F (36.8 C) 98.8 F (37.1 C)  SpO2: 100% 100%   Last BM Date: 09/12/18 General:  Lethargic, thin, pleasant and cooperative in NAD, disheveled Head: normocephalic, atraumatic Eyes: anicteric sclera ENT: oropharynx clear Neck: supple, nontender Lungs:  Clear throughout to auscultation.   No wheezes, crackles, or rhonchi. No acute distress. Heart:  Regular rate and rhythm; no murmurs, clicks, rubs,  or gallops. Abdomen: diffuse tenderness with guarding,+rebound, flat, +BS  Rectal:  Deferred Ext: no edema  GI:  Lab Results: Recent Labs    09/17/18 2210 09/18/18 0533  WBC 9.8 9.4  HGB 12.7* 13.0  HCT  37.7* 39.2  PLT 193 139*   BMET Recent Labs    09/17/18 2210 09/18/18 0533  NA 134* 133*  K 3.6 3.8  CL 103 103  CO2 24 23  GLUCOSE 100* 99  BUN 8 10  CREATININE 0.55* 0.65  CALCIUM 8.7* 8.6*   LFT Recent Labs    09/18/18 0533  PROT 6.6  ALBUMIN 2.9*  AST 35  ALT 22  ALKPHOS 106  BILITOT 0.7   PT/INR No results for input(s): LABPROT, INR in the last 72 hours.   Studies/Results: No results found.  Impression/Plan: Acute on chronic pancreatitis with history of pseudocyst drainage in the past who had a CT scan yesterday that reportedly showed pancreatic masses. He needs additional imaging to further evaluate for possible pancreatic masses which may be pseudocysts so will order an MRI/MRCP. May need an EUS if a pancreatic mass is noted but if active pancreatitis that may need to be delayed. NPO until MRCP and then clear liquid diet. IVFs. Supportive care.    LOS: 1 day   Shirley FriarVincent C Yechezkel Fertig  09/18/2018, 8:53 AM  Questions please call [phone]

## 2018-09-18 NOTE — Progress Notes (Signed)
Forms were given to unit secretary to place in chart.   09/18/18 1500  Clinical Encounter Type  Visited With Patient;Other (Comment) (caregiver)  Visit Type Other (Comment) (Completed Advance Directives Mental health and regular AD)  Referral From Nurse  Consult/Referral To Chaplain  Spiritual Encounters  Spiritual Needs Other (Comment) (just wanted forms completed)  Advance Directives (For Healthcare)  Does Patient Have a Medical Advance Directive? Yes  Mental Health Advance Directives  Does Patient Have a Mental Health Advance Directive? Yes

## 2018-09-19 LAB — COMPREHENSIVE METABOLIC PANEL
ALK PHOS: 97 U/L (ref 38–126)
ALT: 17 U/L (ref 0–44)
AST: 26 U/L (ref 15–41)
Albumin: 2.5 g/dL — ABNORMAL LOW (ref 3.5–5.0)
Anion gap: 6 (ref 5–15)
BILIRUBIN TOTAL: 0.8 mg/dL (ref 0.3–1.2)
BUN: <5 mg/dL — ABNORMAL LOW (ref 6–20)
CALCIUM: 8.3 mg/dL — AB (ref 8.9–10.3)
CO2: 25 mmol/L (ref 22–32)
CREATININE: 0.73 mg/dL (ref 0.61–1.24)
Chloride: 106 mmol/L (ref 98–111)
GFR calc non Af Amer: >60 mL/min (ref >60)
Glucose, Bld: 114 mg/dL — ABNORMAL HIGH (ref 70–99)
Potassium: 4.2 mmol/L (ref 3.5–5.1)
Sodium: 137 mmol/L (ref 135–145)
TOTAL PROTEIN: 5.6 g/dL — AB (ref 6.5–8.1)

## 2018-09-19 LAB — CBC
HEMATOCRIT: 32.6 % — AB (ref 39.0–52.0)
HEMOGLOBIN: 10.9 g/dL — AB (ref 13.0–17.0)
MCH: 30.4 pg (ref 26.0–34.0)
MCHC: 33.4 g/dL (ref 30.0–36.0)
MCV: 90.8 fL (ref 80.0–100.0)
Platelets: 128 10*3/uL — ABNORMAL LOW (ref 150–400)
RBC: 3.59 MIL/uL — AB (ref 4.22–5.81)
RDW: 12.2 % (ref 11.5–15.5)
WBC: 5 10*3/uL (ref 4.0–10.5)
nRBC: 0 % (ref 0.0–0.2)

## 2018-09-19 MED ORDER — MAGNESIUM SULFATE 2 GM/50ML IV SOLN
2.0000 g | Freq: Once | INTRAVENOUS | Status: AC
  Administered 2018-09-19: 2 g via INTRAVENOUS
  Filled 2018-09-19: qty 50

## 2018-09-19 MED ORDER — LEVETIRACETAM 500 MG PO TABS
500.0000 mg | ORAL_TABLET | Freq: Two times a day (BID) | ORAL | Status: DC
  Administered 2018-09-19 – 2018-09-20 (×3): 500 mg via ORAL
  Filled 2018-09-19 (×3): qty 1

## 2018-09-19 MED ORDER — MAGNESIUM OXIDE 400 (241.3 MG) MG PO TABS
200.0000 mg | ORAL_TABLET | Freq: Every day | ORAL | Status: DC
  Administered 2018-09-19 – 2018-09-20 (×2): 200 mg via ORAL
  Filled 2018-09-19 (×2): qty 1

## 2018-09-19 MED ORDER — LACTULOSE 10 GM/15ML PO SOLN
20.0000 g | Freq: Every day | ORAL | Status: DC
  Filled 2018-09-19 (×2): qty 30

## 2018-09-19 MED ORDER — FOLIC ACID 1 MG PO TABS
1.0000 mg | ORAL_TABLET | Freq: Every day | ORAL | Status: DC
  Administered 2018-09-20: 1 mg via ORAL
  Filled 2018-09-19 (×2): qty 1

## 2018-09-19 MED ORDER — VITAMIN B-1 100 MG PO TABS
100.0000 mg | ORAL_TABLET | Freq: Every day | ORAL | Status: DC
  Administered 2018-09-20: 100 mg via ORAL
  Filled 2018-09-19 (×2): qty 1

## 2018-09-19 NOTE — Progress Notes (Signed)
PROGRESS NOTE                                                                                                                                                                                                             Patient Demographics:    Todd Coffey, is a 57 y.o. male, DOB - January 12, 1961, [id number]  Admit date - 09/17/2018   Admitting Physician Elyse Hsu, MD  Outpatient Primary MD for the patient is No primary care provider on file.  LOS - 2  CC Abd Pain     Brief Narrative  Todd Coffey is a 57 y.o. male with medical history significant of chronic alcoholism who used to reside in [place] but moved over here by sister, history of alcoholic liver cirrhosis with varices, alcoholic pancreatitis with pseudocyst who has recently moved from [place] to [place] to live with his sister presented to [facility] with epigastric abdominal pain he was diagnosed with recurrent pancreatitis with a suspicious pancreatic mass and transferred here for further work-up and care.  Subjective  -  Patient in bed, appears comfortable, denies any headache, no fever, no chest pain or pressure, no shortness of breath , no abdominal pain. No focal weakness.    Assessment  & Plan :     1. Abdominal pain due to acute on chronic alcoholic pancreatitis - treated conservatively with bowel rest, IV fluids and pain control, MRI nonacute with questionable pancreatic divisum, GI on board overall symptoms much better will place on clear liquid diet and monitor if stable likely discharge home tomorrow.   2.  HX of alcohol abuse, alcoholic cirrhosis with varices.  No acute issues, last drink over 2 months ago, counseled to continue to abstain.  Monitor with supportive care.  Renew folic acid and thiamine.  3.  Hypomagnesemia.  Replaced will monitor.  4.  History of seizures and question if these were alcohol withdrawal seizures however he has been on Keppra which will be  continued.   Note med rec will be done and reviewed.    Family Communication  : None  Code Status :  Full  Disposition Plan  :  Med  Consults  :  GI  Procedures  :    MRI -   DVT Prophylaxis  :    Heparin    Lab Results  Component Value Date   PLT 128 (L) 09/19/2018    Diet :  Diet Order  Diet clear liquid Room service appropriate? Yes; Fluid consistency: Thin  Diet effective now               Inpatient Medications Scheduled Meds: . chlorhexidine  15 mL Mouth Rinse BID  . folic acid  1 mg Intravenous Daily  . heparin injection (subcutaneous)  5,000 Units Subcutaneous Q8H  . Influenza vac split quadrivalent PF  0.5 mL Intramuscular Tomorrow-1000  . mouth rinse  15 mL Mouth Rinse q12n4p  . pneumococcal 23 valent vaccine  0.5 mL Intramuscular Tomorrow-1000  . thiamine injection  100 mg Intravenous Daily   Continuous Infusions: . dextrose 5% lactated ringers 100 mL/hr at 09/19/18 0543  . magnesium sulfate 1 - 4 g bolus IVPB     PRN Meds:.ketorolac, [DISCONTINUED] ondansetron **OR** ondansetron (ZOFRAN) IV  Antibiotics  :   Anti-infectives (From admission, onward)   None          Objective:   Vitals:   09/18/18 0528 09/18/18 1348 09/18/18 2128 09/19/18 0345  BP: 115/79 95/66 100/70 124/88  Pulse: 80 86 91 88  Resp: 20 19 16 17   Temp: 98.8 F (37.1 C) 99.2 F (37.3 C) 98.6 F (37 C) 98.5 F (36.9 C)  TempSrc: Oral Oral Oral Oral  SpO2: 100% 100% 99% 100%  Weight:      Height:        Wt Readings from Last 3 Encounters:  09/17/18 52.7 kg     Intake/Output Summary (Last 24 hours) at 09/19/2018 1108 Last data filed at 09/19/2018 1000 Gross per 24 hour  Intake 2176.09 ml  Output 1650 ml  Net 526.09 ml     Physical Exam  Awake Alert, Oriented X 3, No new F.N deficits, Normal affect [place].AT,PERRAL Supple Neck,No JVD, No cervical lymphadenopathy appriciated.  Symmetrical Chest wall movement, Good air movement bilaterally,  CTAB RRR,No Gallops, Rubs or new Murmurs, No Parasternal Heave +ve B.Sounds, Abd Soft, No tenderness, No organomegaly appriciated, No rebound - guarding or rigidity. No Cyanosis, Clubbing or edema, No new Rash or bruise    Data Review:    CBC Recent Labs  Lab 09/17/18 2210 09/18/18 0533 09/18/18 0758 09/19/18 0333  WBC 9.8 9.4 8.7 5.0  HGB 12.7* 13.0 11.9* 10.9*  HCT 37.7* 39.2 37.0* 32.6*  PLT 193 139* 177 128*  MCV 90.6 91.2 92.5 90.8  MCH 30.5 30.2 29.8 30.4  MCHC 33.7 33.2 32.2 33.4  RDW 12.2 12.4 12.4 12.2  LYMPHSABS 2.2  --   --   --   MONOABS 1.2*  --   --   --   EOSABS 0.1  --   --   --   BASOSABS 0.0  --   --   --     Chemistries  Recent Labs  Lab 09/17/18 2210 09/18/18 0533 09/18/18 0758 09/19/18 0333  NA 134* 133* 134* 137  K 3.6 3.8 3.7 4.2  CL 103 103 105 106  CO2 24 23 24 25   GLUCOSE 100* 99 112* 114*  BUN 8 10 10  <5*  CREATININE 0.55* 0.65 0.54* 0.73  CALCIUM 8.7* 8.6* 8.3* 8.3*  MG  --   --  1.6*  --   AST  --  35 29 26  ALT  --  22 20 17   ALKPHOS  --  106 102 97  BILITOT  --  0.7 0.6 0.8   ------------------------------------------------------------------------------------------------------------------ Recent Labs    09/18/18 0758  CHOL 86  HDL 32*  LDLCALC 45  TRIG 46  CHOLHDL 2.7    No results found for: HGBA1C ------------------------------------------------------------------------------------------------------------------ No results for input(s): TSH, T4TOTAL, T3FREE, THYROIDAB in the last 72 hours.  Invalid input(s): FREET3 ------------------------------------------------------------------------------------------------------------------ No results for input(s): VITAMINB12, FOLATE, FERRITIN, TIBC, IRON, RETICCTPCT in the last 72 hours.  Coagulation profile No results for input(s): INR, PROTIME in the last 168 hours.  No results for input(s): DDIMER in the last 72 hours.  Cardiac Enzymes No results for input(s): CKMB,  TROPONINI, MYOGLOBIN in the last 168 hours.  Invalid input(s): CK ------------------------------------------------------------------------------------------------------------------ No results found for: BNP  Micro Results No results found for this or any previous visit (from the past 240 hour(s)).  Radiology Reports Mr Abdomen Mrcp Vivien RossettiW Wo Contast  Result Date: 09/18/2018 CLINICAL DATA:  Followup pancreatitis. EXAM: MRI ABDOMEN WITHOUT AND WITH CONTRAST (INCLUDING MRCP) TECHNIQUE: Multiplanar multisequence MR imaging of the abdomen was performed both before and after the administration of intravenous contrast. Heavily T2-weighted images of the biliary and pancreatic ducts were obtained, and three-dimensional MRCP images were rendered by post processing. CONTRAST:  5 cc Gadavist COMPARISON:  CT scan 09/17/2018 FINDINGS: Very poor contrast enhancement on the study. I really do not see any contrast in the aorta or the liver or the kidneys intoe be very delayed postcontrast coronal images. Lower chest: The lung bases are grossly clear. No pulmonary lesions or pleural effusion. No pericardial effusion. Mild distal esophageal wall thickening as demonstrated on the CT scan. Hepatobiliary: No focal hepatic lesions or intrahepatic biliary dilatation. The gallbladder is moderately distended. No definite gallstones or findings for acute cholecystitis. No common bile duct dilatation. Pancreas: As seen on the CT scan there are changes of acute and chronic pancreatitis. There is moderate inflammation in and around the pancreas and a diffusely dilated main pancreatic duct mainly in the head and body region. I suspect the patient has pancreatic divisum. Numerous calcifications are better seen on the CT scan. No worrisome mass lesions are identified. No evidence of hemorrhage. Spleen: Stable splenomegaly. I do not see any obvious changes of [place] cirrhosis but there is portal venous hypertension with portal venous  collaterals. The portal vein is patent. 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 Adrenals/Urinary Tract: The adrenal glands and kidneys are unremarkable. Stomach/Bowel: The stomach, duodenum, visualized small bowel and visualize colon are unremarkable. Vascular/Lymphatic: The aorta and branch vessels are patent. The major venous structures are patent except for the splenic vein. Numerous mesenteric and retroperitoneal lymph nodes as seen on the CT scan. None of these are enlarged. Other:  No ascites or abdominal wall hernia. Musculoskeletal: No significant bony findings. IMPRESSION: 1. Changes of acute on chronic pancreatitis as seen on the CT scan. Possible pancreatic divisum. No dilatation of the common bile duct or common bile duct stones. 2. Distended gallbladder but no gallstones or findings for acute cholecystitis. 3. 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 And 4. Numerous small mesenteric and retroperitoneal lymph nodes appears stable. Electronically Signed   By: Rudie MeyerP.  Gallerani M.D.   On: 09/18/2018 13:29    Time Spent in minutes  30   Susa RaringPrashant Halim Surrette M.D on 09/19/2018 at 11:08 AM  To page go to [email or web address] - password [email or web address]

## 2018-09-19 NOTE — Progress Notes (Signed)
[facility] Progress Note  Todd SprinklesVincent Coffey 57 y.o. 11/11/1960   Subjective: Feels a lot better. Denies abdominal pain, nausea, or vomiting.  Objective: Vital signs: Vitals:   09/18/18 2128 09/19/18 0345  BP: 100/70 124/88  Pulse: 91 88  Resp: 16 17  Temp: 98.6 F (37 C) 98.5 F (36.9 C)  SpO2: 99% 100%    Physical Exam: Gen: alert, no acute distress, disheveled HEENT: anicteric sclera CV: RRR Chest: CTA B Abd: upper quadrant tenderness (less tender) with guarding, soft, nondistended, +BS Ext: no edema  Lab Results: Recent Labs    09/18/18 0758 09/19/18 0333  NA 134* 137  K 3.7 4.2  CL 105 106  CO2 24 25  GLUCOSE 112* 114*  BUN 10 <5*  CREATININE 0.54* 0.73  CALCIUM 8.3* 8.3*  MG 1.6*  --    Recent Labs    09/18/18 0758 09/19/18 0333  AST 29 26  ALT 20 17  ALKPHOS 102 97  BILITOT 0.6 0.8  PROT 5.6* 5.6*  ALBUMIN 2.6* 2.5*   Recent Labs    09/17/18 2210  09/18/18 0758 09/19/18 0333  WBC 9.8   < > 8.7 5.0  NEUTROABS 6.3  --   --   --   HGB 12.7*   < > 11.9* 10.9*  HCT 37.7*   < > 37.0* 32.6*  MCV 90.6   < > 92.5 90.8  PLT 193   < > 177 128*   < > = values in this interval not displayed.   Lipase 48   Assessment/Plan: Acute on chronic pancreatitis without pancreatic mass seen on MRI. Clinically improving. Agree with clear liquid diet and advance as tolerated to low fat diet. Will need an EUS in 8-12 weeks. F/U with me in the office in 4-6 weeks. Will sign off. Call if questions.   Shirley FriarVincent C Sammie Coffey 09/19/2018, 11:58 AM  Questions please call [phone] ID: Todd SprinklesVincent Coffey, male   DOB: 04/06/1961, 57 y.o.   MRN: [id number]

## 2018-09-20 LAB — COMPREHENSIVE METABOLIC PANEL
ALT: 18 U/L (ref 0–44)
AST: 31 U/L (ref 15–41)
Albumin: 2.6 g/dL — ABNORMAL LOW (ref 3.5–5.0)
Alkaline Phosphatase: 98 U/L (ref 38–126)
Anion gap: 5 (ref 5–15)
BUN: <5 mg/dL — ABNORMAL LOW (ref 6–20)
CHLORIDE: 105 mmol/L (ref 98–111)
CO2: 26 mmol/L (ref 22–32)
CREATININE: 0.61 mg/dL (ref 0.61–1.24)
Calcium: 8.4 mg/dL — ABNORMAL LOW (ref 8.9–10.3)
GFR calc non Af Amer: >60 mL/min (ref >60)
Glucose, Bld: 98 mg/dL (ref 70–99)
Potassium: 4.6 mmol/L (ref 3.5–5.1)
SODIUM: 136 mmol/L (ref 135–145)
Total Bilirubin: 0.6 mg/dL (ref 0.3–1.2)
Total Protein: 6 g/dL — ABNORMAL LOW (ref 6.5–8.1)

## 2018-09-20 LAB — CBC
HCT: 33 % — ABNORMAL LOW (ref 39.0–52.0)
Hemoglobin: 10.9 g/dL — ABNORMAL LOW (ref 13.0–17.0)
MCH: 30.4 pg (ref 26.0–34.0)
MCHC: 33 g/dL (ref 30.0–36.0)
MCV: 91.9 fL (ref 80.0–100.0)
PLATELETS: 126 10*3/uL — AB (ref 150–400)
RBC: 3.59 MIL/uL — AB (ref 4.22–5.81)
RDW: 12 % (ref 11.5–15.5)
WBC: 4.8 10*3/uL (ref 4.0–10.5)
nRBC: 0 % (ref 0.0–0.2)

## 2018-09-20 LAB — MAGNESIUM: Magnesium: 1.8 mg/dL (ref 1.7–2.4)

## 2018-09-20 NOTE — Progress Notes (Signed)
Marisa SprinklesVincent Langner to be D/C'd Home per MD order.  Discussed with the patient and all questions fully answered.  VSS, Skin clean, dry and intact without evidence of skin break down, no evidence of skin tears noted. IV catheter discontinued intact. Site without signs and symptoms of complications. Dressing and pressure applied.  An After Visit Summary was printed and given to the patient. No prescriptions needed/given.   D/c education completed with patient/family including follow up instructions, medication list, d/c activities limitations if indicated, with other d/c instructions as indicated by MD - patient able to verbalize understanding, all questions fully answered.   Patient instructed to return to ED, call 911, or call MD for any changes in condition.   Patient and sister escorted by nurse to exit, and D/C home via private auto.  Garry HeaterDannon D Tama Grosz 09/20/2018 2:47 PM

## 2018-09-20 NOTE — Discharge Instructions (Signed)
Follow with Primary MD in 7 days   Get CBC, CMP   checked  by Primary MD in 5-7 days  Activity: As tolerated with Full fall precautions use walker/cane & assistance as needed  Disposition Home   Diet: Soft low-fat diet for 1 week then advance to regular consistency heart healthy diet as tolerated  Special Instructions: If you have smoked or chewed Tobacco  in the last 2 yrs please stop smoking, stop any regular Alcohol  and or any Recreational drug use.  On your next visit with your primary care physician please Get Medicines reviewed and adjusted.  Please request your Prim.MD to go over all Hospital Tests and Procedure/Radiological results at the follow up, please get all Hospital records sent to your Prim MD by signing hospital release before you go home.  If you experience worsening of your admission symptoms, develop shortness of breath, life threatening emergency, suicidal or homicidal thoughts you must seek medical attention immediately by calling 911 or calling your MD immediately  if symptoms less severe.  You Must read complete instructions/literature along with all the possible adverse reactions/side effects for all the Medicines you take and that have been prescribed to you. Take any new Medicines after you have completely understood and accpet all the possible adverse reactions/side effects.

## 2018-09-20 NOTE — Discharge Summary (Signed)
Todd Coffey ZOX:[id number] DOB: Feb 01, 1961 DOA: 09/17/2018  PCP: No primary care provider on file.  Admit date: 09/17/2018  Discharge date: 09/20/2018  Admitted From: Home   Disposition:  Home   Recommendations for Outpatient Follow-up:   Follow up with PCP in 1-2 weeks  PCP Please obtain BMP/CBC, 2 view CXR in 1week,  (see Discharge instructions)   PCP Please follow up on the following pending results:    Home Health: None   Equipment/Devices: None  Consultations: GI Discharge Condition: Stable   CODE STATUS: Full   Diet Recommendation: Low-fat diet for 1 week then advance to regular consistency heart Healthy as tolerated  Diet Order            DIET SOFT Room service appropriate? Yes; Fluid consistency: Thin  Diet effective now               No chief complaint on file.    Brief history of present illness from the day of admission and additional interim summary    Todd Coffey a 57 y.o.malewith medical history significant ofchronic alcoholism who used to reside in [place] but moved over here by sister, history of alcoholic liver cirrhosis with varices, alcoholic pancreatitis with pseudocyst who has recently moved from [place] to [place] to live with his sister presented to [facility] with epigastric abdominal pain he was diagnosed with recurrent pancreatitis with a suspicious pancreatic mass and transferred here for further work-up and care.                                                                 Hospital Course    1. Abdominal pain due to acute on chronic alcoholic pancreatitis - treated conservatively with bowel rest, IV fluids and pain control, MRI nonacute with questionable pancreatic divisum, seen by GI and treated conservatively, now back to baseline tolerating  liquid diet which has been advanced to soft, pain-free and eager to go home will be discharged home on home medications unchanged with outpatient GI follow-up, again he says he has quit drinking alcohol several months ago says at least 2 months or more.   2.  HX of alcohol abuse, alcoholic cirrhosis with varices.  No acute issues, last drink over 2 months ago, counseled to continue to abstain.  Monitor with supportive care.  Renew folic acid and thiamine.  3.  Hypomagnesemia.  Replaced and stable.  4.  History of seizures and question if these were alcohol withdrawal seizures however he has been on Keppra which will be continued.   Discharge diagnosis     Principal Problem:   Acute on chronic pancreatitis [facility]) Active Problems:   Alcoholism (HCC)   Liver cirrhosis (HCC)   Pancreatic mass    Discharge instructions    Discharge Instructions    Discharge instructions  Complete by:  As directed    Follow with Primary MD in 7 days   Get CBC, CMP   checked  by Primary MD in 5-7 days  Activity: As tolerated with Full fall precautions use walker/cane & assistance as needed  Disposition Home   Diet: Soft low-fat diet for 1 week then advance to regular consistency heart healthy diet as tolerated  Special Instructions: If you have smoked or chewed Tobacco  in the last 2 yrs please stop smoking, stop any regular Alcohol  and or any Recreational drug use.  On your next visit with your primary care physician please Get Medicines reviewed and adjusted.  Please request your Prim.MD to go over all Hospital Tests and Procedure/Radiological results at the follow up, please get all Hospital records sent to your Prim MD by signing hospital release before you go home.  If you experience worsening of your admission symptoms, develop shortness of breath, life threatening emergency, suicidal or homicidal thoughts you must seek medical attention immediately by calling 911 or calling your MD  immediately  if symptoms less severe.  You Must read complete instructions/literature along with all the possible adverse reactions/side effects for all the Medicines you take and that have been prescribed to you. Take any new Medicines after you have completely understood and accpet all the possible adverse reactions/side effects.   Increase activity slowly   Complete by:  As directed       Discharge Medications   Allergies as of 09/20/2018      Reactions   Morphine And Related Other (See Comments)   Unknown per pt, states he was out of it.    Peach [prunus Persica] Hives      Medication List    TAKE these medications   acetaminophen 500 MG tablet Commonly known as:  TYLENOL Take 1,000 mg by mouth every 6 (six) hours as needed for mild pain.   folic acid 1 MG tablet Commonly known as:  FOLVITE Take 1 mg by mouth daily.   furosemide 20 MG tablet Commonly known as:  LASIX Take 20 mg by mouth daily.   lactulose 10 GM/15ML solution Commonly known as:  CHRONULAC Take 20 g by mouth daily.   levETIRAcetam 500 MG tablet Commonly known as:  KEPPRA Take 500 mg by mouth 2 (two) times daily.   magnesium oxide 400 MG tablet Commonly known as:  MAG-OX Take 200 mg by mouth daily.   metoprolol tartrate 25 MG tablet Commonly known as:  LOPRESSOR Take 25 mg by mouth 2 (two) times daily.   omeprazole 40 MG capsule Commonly known as:  PRILOSEC Take 40 mg by mouth 2 (two) times daily.   spironolactone 25 MG tablet Commonly known as:  ALDACTONE Take 25 mg by mouth daily.   therapeutic multivitamin-minerals tablet Take 1 tablet by mouth daily.   thiamine 100 MG tablet Take 100 mg by mouth daily.       Follow-up Information    [facility] Follow up on 10/01/2018.   Why:  9:30 am Contact information: [address] [place] [address] [place] [address] [phone]       Lynann Bologna, MD. Schedule an appointment as soon as possible  for a visit in 1 week(s).   Specialties:  Gastroenterology, Internal Medicine Why:  Alcoholic pancreatitis Contact information: [address] [place] [address] [phone]           Major procedures and Radiology Reports -  PLEASE review detailed and final reports thoroughly  -        Mr Abdomen Mrcp W Wo Contast  Result Date: 09/18/2018 CLINICAL DATA:  Followup pancreatitis. EXAM: MRI ABDOMEN WITHOUT AND WITH CONTRAST (INCLUDING MRCP) TECHNIQUE: Multiplanar multisequence MR imaging of the abdomen was performed both before and after the administration of intravenous contrast. Heavily T2-weighted images of the biliary and pancreatic ducts were obtained, and three-dimensional MRCP images were rendered by post processing. CONTRAST:  5 cc Gadavist COMPARISON:  CT scan 09/17/2018 FINDINGS: Very poor contrast enhancement on the study. I really do not see any contrast in the aorta or the liver or the kidneys intoe be very delayed postcontrast coronal images. Lower chest: The lung bases are grossly clear. No pulmonary lesions or pleural effusion. No pericardial effusion. Mild distal esophageal wall thickening as demonstrated on the CT scan. Hepatobiliary: No focal hepatic lesions or intrahepatic biliary dilatation. The gallbladder is moderately distended. No definite gallstones or findings for acute cholecystitis. No common bile duct dilatation. Pancreas: As seen on the CT scan there are changes of acute and chronic pancreatitis. There is moderate inflammation in and around the pancreas and a diffusely dilated main pancreatic duct mainly in the head and body region. I suspect the patient has pancreatic divisum. Numerous calcifications are better seen on the CT scan. No worrisome mass lesions are identified. No evidence of hemorrhage. Spleen: Stable splenomegaly. I do not see any obvious changes of [place] cirrhosis but there is portal venous hypertension with portal venous  collaterals. The portal vein is patent. 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 Adrenals/Urinary Tract: The adrenal glands and kidneys are unremarkable. Stomach/Bowel: The stomach, duodenum, visualized small bowel and visualize colon are unremarkable. Vascular/Lymphatic: The aorta and branch vessels are patent. The major venous structures are patent except for the splenic vein. Numerous mesenteric and retroperitoneal lymph nodes as seen on the CT scan. None of these are enlarged. Other:  No ascites or abdominal wall hernia. Musculoskeletal: No significant bony findings. IMPRESSION: 1. Changes of acute on chronic pancreatitis as seen on the CT scan. Possible pancreatic divisum. No dilatation of the common bile duct or common bile duct stones. 2. Distended gallbladder but no gallstones or findings for acute cholecystitis. 3. 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 And 4. Numerous small mesenteric and retroperitoneal lymph nodes appears stable. Electronically Signed   By: Rudie MeyerP.  Gallerani M.D.   On: 09/18/2018 13:29    Micro Results     No results found for this or any previous visit (from the past 240 hour(s)).  Today   Subjective    Marisa SprinklesVincent Coffey today has no headache,no chest abdominal pain,no new weakness tingling or numbness, feels much better wants to go home today.     Objective   Blood pressure 110/82, pulse 76, temperature 98.2 F (36.8 C), temperature source Oral, resp. rate 16, height 5\' 6"  (1.676 m), weight 52.7 kg, SpO2 100 %.   Intake/Output Summary (Last 24 hours) at 09/20/2018 0838 Last data filed at 09/20/2018 0545 Gross per 24 hour  Intake 1696.49 ml  Output 2100 ml  Net -403.51 ml    Exam Awake Alert, Oriented x 3, No new F.N deficits, Normal affect [place].AT,PERRAL Supple Neck,No JVD, No cervical lymphadenopathy appriciated.  Symmetrical Chest wall movement, Good air movement bilaterally, CTAB RRR,No Gallops,Rubs or new  Murmurs, No Parasternal Heave +ve B.Sounds, Abd Soft, Non tender, No organomegaly appriciated, No rebound -guarding or rigidity. No Cyanosis, Clubbing or edema,  No new Rash or bruise   Data Review   CBC w Diff:  Lab Results  Component Value Date   WBC 4.8 09/20/2018   HGB 10.9 (L) 09/20/2018   HCT 33.0 (L) 09/20/2018   PLT 126 (L) 09/20/2018   LYMPHOPCT 22 09/17/2018   MONOPCT 12 09/17/2018   EOSPCT 1 09/17/2018   BASOPCT 0 09/17/2018    CMP:  Lab Results  Component Value Date   NA 136 09/20/2018   K 4.6 09/20/2018   CL 105 09/20/2018   CO2 26 09/20/2018   BUN <5 (L) 09/20/2018   CREATININE 0.61 09/20/2018   PROT 6.0 (L) 09/20/2018   ALBUMIN 2.6 (L) 09/20/2018   BILITOT 0.6 09/20/2018   ALKPHOS 98 09/20/2018   AST 31 09/20/2018   ALT 18 09/20/2018  .   Total Time in preparing paper work, data evaluation and todays exam - 35 minutes  Susa Raring M.D on 09/20/2018 at 8:38 AM  [facility]   Office  [phone]

## 2018-09-20 NOTE — Care Management Note (Signed)
Case Management Note  Patient Details  Name: Todd SprinklesVincent Coffey MRN: [id number] Date of Birth: 08/25/1961  Subjective/Objective:   From home, states he goes [facility], his sister will transport him home today, he will need ast with medications, NCM will ast with [employer], will need printed scripts.                    Action/Plan: DC home when ready.   Expected Discharge Date:  09/20/18               Expected Discharge Plan:  Home/Self Care  In-House Referral:     Discharge planning Services  CM Consult, [facility] Program, Indigent Health Clinic  Post Acute Care Choice:    Choice offered to:     DME Arranged:    DME Agency:     HH Arranged:    HH Agency:     Status of Service:  Completed, signed off  If discussed at [employer] Meetings, dates discussed:    Additional Comments:  Leone Havenaylor, Gaetan Spieker [place], RN 09/20/2018, 9:22 AM

## 2018-09-20 NOTE — Progress Notes (Signed)
Patient has no new meds listed on AVS for DC. [facility] No other CM needs identified.

## 2018-10-01 ENCOUNTER — Inpatient Hospital Stay: Payer: Self-pay | Admitting: Nurse Practitioner

## 2018-10-06 ENCOUNTER — Encounter: Payer: Self-pay | Admitting: Critical Care Medicine

## 2018-10-06 ENCOUNTER — Ambulatory Visit: Payer: Medicaid Other | Attending: Nurse Practitioner | Admitting: Critical Care Medicine

## 2018-10-06 VITALS — BP 115/82 | HR 104 | Temp 98.6°F | Resp 20 | Ht 66.5 in | Wt 124.0 lb

## 2018-10-06 DIAGNOSIS — F1020 Alcohol dependence, uncomplicated: Secondary | ICD-10-CM | POA: Insufficient documentation

## 2018-10-06 DIAGNOSIS — Z87891 Personal history of nicotine dependence: Secondary | ICD-10-CM | POA: Insufficient documentation

## 2018-10-06 DIAGNOSIS — I81 Portal vein thrombosis: Secondary | ICD-10-CM | POA: Insufficient documentation

## 2018-10-06 DIAGNOSIS — K257 Chronic gastric ulcer without hemorrhage or perforation: Secondary | ICD-10-CM | POA: Diagnosis not present

## 2018-10-06 DIAGNOSIS — K852 Alcohol induced acute pancreatitis without necrosis or infection: Secondary | ICD-10-CM | POA: Diagnosis not present

## 2018-10-06 DIAGNOSIS — K219 Gastro-esophageal reflux disease without esophagitis: Secondary | ICD-10-CM | POA: Insufficient documentation

## 2018-10-06 DIAGNOSIS — Z79899 Other long term (current) drug therapy: Secondary | ICD-10-CM | POA: Insufficient documentation

## 2018-10-06 DIAGNOSIS — Z8249 Family history of ischemic heart disease and other diseases of the circulatory system: Secondary | ICD-10-CM | POA: Diagnosis not present

## 2018-10-06 DIAGNOSIS — E46 Unspecified protein-calorie malnutrition: Secondary | ICD-10-CM | POA: Insufficient documentation

## 2018-10-06 DIAGNOSIS — I10 Essential (primary) hypertension: Secondary | ICD-10-CM | POA: Insufficient documentation

## 2018-10-06 DIAGNOSIS — R569 Unspecified convulsions: Secondary | ICD-10-CM | POA: Insufficient documentation

## 2018-10-06 DIAGNOSIS — K7031 Alcoholic cirrhosis of liver with ascites: Secondary | ICD-10-CM

## 2018-10-06 DIAGNOSIS — F10288 Alcohol dependence with other alcohol-induced disorder: Secondary | ICD-10-CM

## 2018-10-06 DIAGNOSIS — Z885 Allergy status to narcotic agent status: Secondary | ICD-10-CM | POA: Diagnosis not present

## 2018-10-06 DIAGNOSIS — R1011 Right upper quadrant pain: Secondary | ICD-10-CM | POA: Diagnosis present

## 2018-10-06 DIAGNOSIS — R1012 Left upper quadrant pain: Secondary | ICD-10-CM | POA: Diagnosis present

## 2018-10-06 DIAGNOSIS — K859 Acute pancreatitis without necrosis or infection, unspecified: Secondary | ICD-10-CM

## 2018-10-06 DIAGNOSIS — K7011 Alcoholic hepatitis with ascites: Secondary | ICD-10-CM

## 2018-10-06 DIAGNOSIS — K861 Other chronic pancreatitis: Secondary | ICD-10-CM

## 2018-10-06 MED ORDER — ACETAMINOPHEN 500 MG PO TABS: 500.0000 mg | ORAL_TABLET | Freq: Four times a day (QID) | ORAL | Status: DC | PRN

## 2018-10-06 MED ORDER — THERAPEUTIC MULTIVIT/MINERAL PO TABS: 1.0000 | ORAL_TABLET | Freq: Every day | ORAL | 6 refills | Status: AC

## 2018-10-06 MED ORDER — MAGNESIUM OXIDE 400 MG PO TABS: 200.0000 mg | ORAL_TABLET | Freq: Every day | ORAL | 6 refills | Status: DC

## 2018-10-06 MED ORDER — FOLIC ACID 1 MG PO TABS: 1.0000 mg | ORAL_TABLET | Freq: Every day | ORAL | 6 refills | Status: DC

## 2018-10-06 MED ORDER — SPIRONOLACTONE 25 MG PO TABS: 25.0000 mg | ORAL_TABLET | Freq: Every day | ORAL | 6 refills | Status: DC

## 2018-10-06 MED ORDER — FUROSEMIDE 20 MG PO TABS: 20.0000 mg | ORAL_TABLET | Freq: Every day | ORAL | 6 refills | Status: DC

## 2018-10-06 MED ORDER — METOPROLOL TARTRATE 25 MG PO TABS: 25.0000 mg | ORAL_TABLET | Freq: Two times a day (BID) | ORAL | 6 refills | Status: DC

## 2018-10-06 MED ORDER — OMEPRAZOLE 40 MG PO CPDR: 40.0000 mg | DELAYED_RELEASE_CAPSULE | Freq: Two times a day (BID) | ORAL | 6 refills | Status: DC

## 2018-10-06 MED ORDER — LEVETIRACETAM 500 MG PO TABS: 500.0000 mg | ORAL_TABLET | Freq: Two times a day (BID) | ORAL | 6 refills | Status: DC

## 2018-10-06 MED ORDER — THIAMINE HCL 100 MG PO TABS: 100.0000 mg | ORAL_TABLET | Freq: Every day | ORAL | 6 refills | Status: AC

## 2018-10-06 MED FILL — SPIRONOLACTONE 25 MG TABLET: 25 | 30 days supply | Qty: 30 | Fill #0

## 2018-10-06 MED FILL — OMEPRAZOLE DR 40 MG CAPSULE: 40 | 30 days supply | Qty: 60 | Fill #0

## 2018-10-06 MED FILL — FUROSEMIDE 20 MG TABLET: 20 | 30 days supply | Qty: 30 | Fill #0

## 2018-10-06 MED FILL — levETIRAcetam 500 MG TABS: 500 | 30 days supply | Qty: 60 | Fill #0

## 2018-10-06 MED FILL — MAGNESIUM OXIDE 400 MG TAB: 400 | 30 days supply | Qty: 15 | Fill #0

## 2018-10-06 MED FILL — METOPROLOL TARTRATE 25 MG T: 25 | 30 days supply | Qty: 60 | Fill #0

## 2018-10-06 MED FILL — FOLIC ACID 1 MG TABS: 1 | 30 days supply | Qty: 30 | Fill #0

## 2018-10-06 NOTE — Assessment & Plan Note (Signed)
History of portal vein thrombosis and portal hypertension from alcoholic cirrhosis

## 2018-10-06 NOTE — Assessment & Plan Note (Signed)
History of acute on chronic pancreatitis, now improved Plan Continued observation

## 2018-10-06 NOTE — Assessment & Plan Note (Addendum)
Chronic alcoholism but currently the patient's not had anything to drink since August since moving [place] the [place]  Continue thiamine and folic acid supplementation

## 2018-10-06 NOTE — Assessment & Plan Note (Signed)
Alcoholic liver cirrhosis with portal hypertension and previous esophageal varices Plan Will eventually need gastroenterology follow-up will need to obtain Medicaid first  Have given the patient a liver disease diet to follow We need to check ammonia levels to assess need for lactulose

## 2018-10-06 NOTE — Progress Notes (Signed)
Subjective:    Patient ID: Todd Coffey, male    DOB: 1961-02-13, 57 y.o.   MRN: [id number]  57 y.o.M here for hosp f/u for esophageal issues. with medical history significant of chronic alcoholism who used to reside in [place] but moved over here by sister, history of alcoholic liver cirrhosis with varices, alcoholic pancreatitis with pseudocyst who has recently moved from [place] to [place] to live with his sister presented to [facility] with epigastric abdominal pain he was diagnosed with recurrent pancreatitis with a suspicious pancreatic mass and transferred here for further work-up and care.  Prior hx two years ago with Varicies treatment.  Since then if swallows certain items will not go down adequately  Abdominal pain due to acute on chronic alcoholic pancreatitis-treated conservatively with bowel rest, IV fluids and pain control, MRI nonacute with questionable pancreatic divisum, seen by GI and treated conservatively, now back to baseline tolerating liquid diet which has been advanced to soft, pain-free and eager to go home will be discharged home on home medications unchanged with outpatient GI follow-up, again he says he has quit drinking alcohol several months ago says at least 2 months or more.   2. HX of alcohol abuse, alcoholic cirrhosis with varices. No acute issues, last drink over 2 months ago, counseled to continue to abstain. Monitor with supportive care.Renew folic acid and thiamine.  3.Hypomagnesemia. Replaced and stable.  4.History of seizures and question if these were alcohol withdrawal seizures however he has been on Keppra which will be continued.   Pt just moved here from [place].    Now eating eating soft regular diet.  Abdomen is better.    Abdominal Pain  This is a new problem. The current episode started 1 to 4 weeks ago. The onset quality is gradual. The problem occurs constantly. The problem has been rapidly worsening. The  pain is located in the LUQ, epigastric region and RUQ. The quality of the pain is dull, aching and sharp. The abdominal pain does not radiate. Associated symptoms include anorexia, diarrhea, nausea and vomiting. Pertinent negatives include no belching, fever, hematochezia or melena. The pain is aggravated by eating. Prior diagnostic workup includes GI consult. His past medical history is significant for abdominal surgery, GERD and pancreatitis. There is no history of gallstones. pancreatic cyst    Past Medical History:  Diagnosis Date  . Acute on chronic pancreatitis (HCC)    hx of pancreatitis; nothing recently til today /notes 09/17/2018 (09/17/2018)  . Acute stomach ulcer 07/2018  . Alcohol dependence with withdrawal ([place]) 92/33/0076   w/complications  . Alcohol withdrawal ([place]) 09/19/2016  . Alcohol-induced chronic pancreatitis ([place]) 09/26/2014; 09/19/2016  . Alcoholic gastritis 22/63/3354  . Alcoholic liver disease ([place]) 07/21/2018  . Altered mental status 05/06/2013  . Anemia due to acute blood loss 07/2018  . Anxiety attack   . Ascites due to alcoholic hepatitis 56/25/6389  . Chest pain 11/09/2013   "have had chest pains several times" (09/17/2018)  . Chronic alcoholism ([place])    Archie Endo 09/17/2018  . Coagulopathy ([place]) unknown  . Family history of von Willebrand disease    "sister, [name]" (09/17/2018)  . Gastrointestinal hemorrhage with melena unknown  . GERD (gastroesophageal reflux disease)   . Headache    "weekly" (09/17/2018)  . History of blood transfusion 06/2018   "10 units in 8 h; probably had some before" (09/17/2018)  . History of cirrhosis of liver    /notes 09/17/2018  . History of hiatal hernia   .  Hypertension   . Hypokalemia 09/19/2016  . Hypomagnesemia 09/19/2016  . Irregular heart rate 07/2018  . Pancreatic mass    pancreatic masses/notes 09/17/2018  . 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 Protein malnutrition ([place]) 07/02/2018   chronic  . Scrotal edema  07/21/2018  . Seizure ([place])    "since ~ 2002; several; controlled w/RX" (09/17/2018)  . Subconjunctival hemorrhage of right eye 06/14/2016  . Tremor 09/26/2014     Family History  Problem Relation Age of Onset  . Heart disease Mother   . Cancer Father   . Alcohol abuse Maternal Uncle      Social History   Socioeconomic History  . Marital status: Single    Spouse name: Not on file  . Number of children: Not on file  . Years of education: Not on file  . Highest education level: Not on file  Occupational History  . Not on file  Social Needs  . Financial resource strain: Not on file  . Food insecurity:    Worry: Not on file    Inability: Not on file  . Transportation needs:    Medical: Not on file    Non-medical: Not on file  Tobacco Use  . Smoking status: Former Smoker    Packs/day: 0.33    Years: 45.00    Pack years: 14.85    Types: Cigarettes  . Smokeless tobacco: Former Systems developer  . Tobacco comment: 09/17/2018 "nothing in over 1 yr"  Substance and Sexual Activity  . Alcohol use: Not Currently    Comment: 09/17/2018 "nothing since 06/30/2018"  . Drug use: Not Currently    Types: Marijuana    Comment: 09/17/2018 "I've done ~ qthing but heroin; last drug use was marijuana in ~ 03/2018"   . Sexual activity: Not Currently  Lifestyle  . Physical activity:    Days per week: Not on file    Minutes per session: Not on file  . Stress: Not on file  Relationships  . Social connections:    Talks on phone: Not on file    Gets together: Not on file    Attends religious service: Not on file    Active member of club or organization: Not on file    Attends meetings of clubs or organizations: Not on file    Relationship status: Not on file  . Intimate partner violence:    Fear of current or ex partner: Not on file    Emotionally abused: Not on file    Physically abused: Not on file    Forced sexual activity: Not on file  Other Topics Concern  . Not on file  Social History  Narrative  . Not on file     Allergies  Allergen Reactions  . Morphine And Related Other (See Comments)    Unknown per pt, states he was out of it.   Marland Kitchen Peach [Prunus Persica] Hives     Outpatient Medications Prior to Visit  Medication Sig Dispense Refill  . acetaminophen (TYLENOL) 500 MG tablet Take 1,000 mg by mouth every 6 (six) hours as needed for mild pain.    . folic acid (FOLVITE) 1 MG tablet Take 1 mg by mouth daily.    . furosemide (LASIX) 20 MG tablet Take 20 mg by mouth daily.    Marland Kitchen levETIRAcetam (KEPPRA) 500 MG tablet Take 500 mg by mouth 2 (two) times daily.    . magnesium oxide (MAG-OX) 400 MG tablet Take 200 mg by mouth daily.    Marland Kitchen  metoprolol tartrate (LOPRESSOR) 25 MG tablet Take 25 mg by mouth 2 (two) times daily.    Marland Kitchen omeprazole (PRILOSEC) 40 MG capsule Take 40 mg by mouth 2 (two) times daily.    Marland Kitchen spironolactone (ALDACTONE) 25 MG tablet Take 25 mg by mouth daily.    Marland Kitchen thiamine 100 MG tablet Take 100 mg by mouth daily.    Marland Kitchen lactulose (CHRONULAC) 10 GM/15ML solution Take 20 g by mouth daily.    Marland Kitchen therapeutic multivitamin-minerals (THERAGRAN-M) tablet Take 1 tablet by mouth daily.     No facility-administered medications prior to visit.       Review of Systems  Constitutional: Negative for fever.  Gastrointestinal: Positive for abdominal pain, anorexia, diarrhea, nausea and vomiting. Negative for hematochezia and melena.       Objective:   Physical Exam Vitals:   10/06/18 1433  BP: 115/82  Pulse: (!) 104  Resp: 20  Temp: 98.6 F (37 C)  TempSrc: Oral  SpO2: 100%  Weight: 124 lb (56.2 kg)  Height: 5' 6.5" (1.689 m)    Gen: Pleasant, thin, in no distress,  normal affect  ENT: No lesions,  mouth clear,  oropharynx clear, no postnasal drip  Neck: No JVD, no TMG, no carotid bruits  Lungs: No use of accessory muscles, no dullness to percussion, clear without rales or rhonchi  Cardiovascular: RRR, heart sounds normal, no murmur or gallops, no  peripheral edema  Abdomen: soft and mildly tender epigastric area, no HSM,  BS normal  Musculoskeletal: No deformities, no cyanosis or clubbing  Neuro: alert, non focal  Skin: Warm, no lesions or rashes  No results found.   All labs and imaging studies from recent hospitalization were reviewed and [facility] MRI 11/14 MRCP IMPRESSION: 1. Changes of acute on chronic pancreatitis as seen on the CT scan. Possible pancreatic divisum. No dilatation of the common bile duct or common bile duct stones. 2. Distended gallbladder but no gallstones or findings for acute cholecystitis. 3. 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 And 4. Numerous small mesenteric and retroperitoneal lymph nodes appears stable.     Assessment & Plan:  I personally reviewed all images and lab data in the [facility] system as well as any outside material available during this office visit and agree with the  radiology impressions.   Acute pancreatitis History of acute on chronic pancreatitis, now improved Plan Continued observation  Liver cirrhosis ([place]) Alcoholic liver cirrhosis with portal hypertension and previous esophageal varices Plan Will eventually need gastroenterology follow-up will need to obtain Medicaid first  Have given the patient a liver disease diet to follow We need to check ammonia levels to assess need for lactulose  Chronic gastric ulcer without hemorrhage and without perforation Chronic gastric ulcer without hemorrhage or perforation alcohol induced  Continue Protonix twice daily Follow-up CBC  Alcoholism ([place]) Chronic alcoholism but currently the patient's not had anything to drink since August since moving to live with his sister here in the [place] area  Continue thiamine and folic acid supplementation  Alcohol related seizure ([place]) History of alcohol-related seizure stable on [place]  levels  Hypomagnesemia Hypomagnesemia now on magnesium supplementation Plan Change magnesium level  Acute on chronic pancreatitis ([place]) Acute on chronic pancreatitis review pancreatitis assessment  Portal vein thrombosis History of portal vein thrombosis and portal hypertension from alcoholic cirrhosis  Ascites due to alcoholic hepatitis History of alcoholic hepatitis with ascites Plan Continue spironolactone and Lasix Follow-up c-Met  [place] was seen today for hospitalization follow-up.  Diagnoses and all orders for this visit:  Acute on chronic pancreatitis ([place]) -     Comprehensive metabolic panel -     CBC with Differential/Platelet; Future -     CBC with Differential/Platelet  Alcoholic cirrhosis of liver with ascites (HCC) -     Comprehensive metabolic panel -     Ammonia  Alcoholism ([place]) -     Comprehensive metabolic panel  Chronic gastric ulcer without hemorrhage and without perforation -     CBC with Differential/Platelet; Future -     CBC with Differential/Platelet  Hypomagnesemia -     Comprehensive metabolic panel -     Magnesium; Future -     Magnesium  Alcohol related seizure (HCC) -     Levetiracetam level  Alcohol-induced acute pancreatitis without infection or necrosis  Portal vein thrombosis  Protein malnutrition (HCC)  Ascites due to alcoholic hepatitis  Other orders -     thiamine 100 MG tablet; Take 1 tablet (100 mg total) by mouth daily. -     therapeutic multivitamin-minerals (THERAGRAN-M) tablet; Take 1 tablet by mouth daily. -     spironolactone (ALDACTONE) 25 MG tablet; Take 1 tablet (25 mg total) by mouth daily. -     omeprazole (PRILOSEC) 40 MG capsule; Take 1 capsule (40 mg total) by mouth 2 (two) times daily. -     metoprolol tartrate (LOPRESSOR) 25 MG tablet; Take 1 tablet (25 mg total) by mouth 2 (two) times daily. -     magnesium oxide (MAG-OX) 400 MG tablet; Take 0.5 tablets (200 mg total) by mouth daily. -      levETIRAcetam (KEPPRA) 500 MG tablet; Take 1 tablet (500 mg total) by mouth 2 (two) times daily. -     furosemide (LASIX) 20 MG tablet; Take 1 tablet (20 mg total) by mouth daily. -     folic acid (FOLVITE) 1 MG tablet; Take 1 tablet (1 mg total) by mouth daily. -     acetaminophen (TYLENOL) 500 MG tablet; Take 1 tablet (500 mg total) by mouth every 6 (six) hours as needed for mild pain or moderate pain.

## 2018-10-06 NOTE — Assessment & Plan Note (Signed)
Hypomagnesemia now on magnesium supplementation Plan Change magnesium level

## 2018-10-06 NOTE — Assessment & Plan Note (Signed)
Acute on chronic pancreatitis review pancreatitis assessment

## 2018-10-06 NOTE — Patient Instructions (Addendum)
Refills on all of your medications were sent to our pharmacy  We will make an appointment with financial counselor to the treatment obtain an orange and blue card  We are checking an ammonia level and will make a determination whether you need lactulose  We will check keppra level and also CBC, CMET   We will call you with your laboratory results  We will establish you with primary care  Alcoholic Liver Disease Alcoholic liver disease happens when the liver does not work the way it should. The condition is caused by drinking too much alcohol for many years. Follow these instructions at home:  Do not drink alcohol.  Take medicines only as told by your doctor.  Take vitamins only as told by your doctor.  Follow any diet instructions that your doctor gave you. You may need to: ? Eat foods that have thiamine. These include whole-wheat cereals, pork, and raw vegetables. ? Eat foods that have folic acid. These include vegetables, fruits, meats, beans, nuts, and dairy foods. ? Eat foods that are high in carbohydrates. These include yogurt, beans, potatoes, and rice. Contact a doctor if:  You have a fever.  You are short of breath.  You have trouble breathing.  You have bright red blood in your poop (stool).  Your poop looks like tar.  You throw up (vomit) blood.  Your skin looks more yellow, pale, or dark.  You get headaches.  You have trouble thinking.  You have trouble balancing or walking. This information is not intended to replace advice given to you by your health care provider. Make sure you discuss any questions you have with your health care provider. Document Released: 08/19/2009 Document Revised: 03/29/2016 Document Reviewed: 09/23/2014 Elsevier Interactive Patient Education  [employer]2018 [employer]

## 2018-10-06 NOTE — Assessment & Plan Note (Signed)
History of alcohol-related seizure stable on Keppra Plan Obtain Keppra levels

## 2018-10-06 NOTE — Assessment & Plan Note (Signed)
History of alcoholic hepatitis with ascites Plan Continue spironolactone and Lasix Follow-up c-Met

## 2018-10-06 NOTE — Assessment & Plan Note (Addendum)
Chronic gastric ulcer without hemorrhage or perforation alcohol induced  Continue Protonix twice daily Follow-up CBC

## 2018-10-06 NOTE — Progress Notes (Signed)
HFU-  Concerns with swallowing H/o esophageal hernia and ulcers Has intermittent discomfort in throat   Out of Folic acid and Theragran

## 2018-10-08 LAB — CBC WITH DIFFERENTIAL/PLATELET
Basophils Absolute: 0 10*3/uL (ref 0.0–0.2)
Basos: 0 %
EOS (ABSOLUTE): 0.1 10*3/uL (ref 0.0–0.4)
Eos: 1 %
HEMOGLOBIN: 12.9 g/dL — AB (ref 13.0–17.7)
Hematocrit: 38 % (ref 37.5–51.0)
Immature Grans (Abs): 0.1 10*3/uL (ref 0.0–0.1)
Immature Granulocytes: 1 %
LYMPHS ABS: 3.4 10*3/uL — AB (ref 0.7–3.1)
Lymphs: 35 %
MCH: 30 pg (ref 26.6–33.0)
MCHC: 33.9 g/dL (ref 31.5–35.7)
MCV: 88 fL (ref 79–97)
MONOCYTES: 10 %
Monocytes Absolute: 1 10*3/uL — ABNORMAL HIGH (ref 0.1–0.9)
NEUTROS ABS: 5.1 10*3/uL (ref 1.4–7.0)
Neutrophils: 53 %
Platelets: 265 10*3/uL (ref 150–450)
RBC: 4.3 x10E6/uL (ref 4.14–5.80)
RDW: 12.1 % — ABNORMAL LOW (ref 12.3–15.4)
WBC: 9.7 10*3/uL (ref 3.4–10.8)

## 2018-10-08 LAB — COMPREHENSIVE METABOLIC PANEL
ALT: 20 IU/L (ref 0–44)
AST: 33 IU/L (ref 0–40)
Albumin/Globulin Ratio: 1.1 — ABNORMAL LOW (ref 1.2–2.2)
Albumin: 3.7 g/dL (ref 3.5–5.5)
Alkaline Phosphatase: 116 IU/L (ref 39–117)
BILIRUBIN TOTAL: 0.3 mg/dL (ref 0.0–1.2)
BUN/Creatinine Ratio: 21 — ABNORMAL HIGH (ref 9–20)
BUN: 12 mg/dL (ref 6–24)
CALCIUM: 9.3 mg/dL (ref 8.7–10.2)
CHLORIDE: 97 mmol/L (ref 96–106)
CO2: 22 mmol/L (ref 20–29)
Creatinine, Ser: 0.58 mg/dL — ABNORMAL LOW (ref 0.76–1.27)
GFR, EST AFRICAN AMERICAN: 131 mL/min/{1.73_m2} (ref >59)
GFR, EST NON AFRICAN AMERICAN: 113 mL/min/{1.73_m2} (ref >59)
GLUCOSE: 61 mg/dL — AB (ref 65–99)
Globulin, Total: 3.5 g/dL (ref 1.5–4.5)
Potassium: 4.2 mmol/L (ref 3.5–5.2)
Sodium: 137 mmol/L (ref 134–144)
TOTAL PROTEIN: 7.2 g/dL (ref 6.0–8.5)

## 2018-10-08 LAB — AMMONIA: Ammonia: 73 ug/dL (ref 27–102)

## 2018-10-08 LAB — MAGNESIUM: MAGNESIUM: 1.6 mg/dL (ref 1.6–2.3)

## 2018-10-08 LAB — LEVETIRACETAM LEVEL: Levetiracetam Lvl: 1.1 ug/mL — ABNORMAL LOW (ref 10.0–40.0)

## 2018-11-07 ENCOUNTER — Ambulatory Visit: Payer: Medicaid Other | Attending: Nurse Practitioner | Admitting: Nurse Practitioner

## 2018-11-07 ENCOUNTER — Ambulatory Visit: Payer: Self-pay

## 2018-11-07 ENCOUNTER — Encounter: Payer: Self-pay | Admitting: Nurse Practitioner

## 2018-11-07 VITALS — BP 121/81 | HR 74 | Temp 98.0°F | Ht 66.5 in | Wt 113.4 lb

## 2018-11-07 DIAGNOSIS — K219 Gastro-esophageal reflux disease without esophagitis: Secondary | ICD-10-CM | POA: Diagnosis not present

## 2018-11-07 DIAGNOSIS — R636 Underweight: Secondary | ICD-10-CM

## 2018-11-07 DIAGNOSIS — R1310 Dysphagia, unspecified: Secondary | ICD-10-CM | POA: Insufficient documentation

## 2018-11-07 DIAGNOSIS — F419 Anxiety disorder, unspecified: Secondary | ICD-10-CM | POA: Insufficient documentation

## 2018-11-07 DIAGNOSIS — Z8249 Family history of ischemic heart disease and other diseases of the circulatory system: Secondary | ICD-10-CM | POA: Diagnosis not present

## 2018-11-07 DIAGNOSIS — K863 Pseudocyst of pancreas: Secondary | ICD-10-CM | POA: Insufficient documentation

## 2018-11-07 DIAGNOSIS — Z7901 Long term (current) use of anticoagulants: Secondary | ICD-10-CM | POA: Insufficient documentation

## 2018-11-07 DIAGNOSIS — Z79899 Other long term (current) drug therapy: Secondary | ICD-10-CM | POA: Insufficient documentation

## 2018-11-07 DIAGNOSIS — R634 Abnormal weight loss: Secondary | ICD-10-CM | POA: Diagnosis not present

## 2018-11-07 DIAGNOSIS — K861 Other chronic pancreatitis: Secondary | ICD-10-CM | POA: Diagnosis not present

## 2018-11-07 DIAGNOSIS — K7030 Alcoholic cirrhosis of liver without ascites: Secondary | ICD-10-CM | POA: Insufficient documentation

## 2018-11-07 DIAGNOSIS — I10 Essential (primary) hypertension: Secondary | ICD-10-CM | POA: Insufficient documentation

## 2018-11-07 DIAGNOSIS — R569 Unspecified convulsions: Secondary | ICD-10-CM | POA: Insufficient documentation

## 2018-11-07 MED ORDER — MAGNESIUM OXIDE 400 MG PO TABS: 200.0000 mg | ORAL_TABLET | Freq: Every day | ORAL | 1 refills | Status: AC

## 2018-11-07 MED ORDER — METOPROLOL TARTRATE 25 MG PO TABS: 25.0000 mg | ORAL_TABLET | Freq: Two times a day (BID) | ORAL | 1 refills | Status: AC

## 2018-11-07 MED ORDER — FUROSEMIDE 20 MG PO TABS: 20.0000 mg | ORAL_TABLET | Freq: Every day | ORAL | 1 refills | Status: AC

## 2018-11-07 MED ORDER — OMEPRAZOLE 40 MG PO CPDR: 40.0000 mg | DELAYED_RELEASE_CAPSULE | Freq: Two times a day (BID) | ORAL | 1 refills | Status: DC

## 2018-11-07 MED ORDER — FOLIC ACID 1 MG PO TABS: 1.0000 mg | ORAL_TABLET | Freq: Every day | ORAL | 1 refills | Status: AC

## 2018-11-07 MED ORDER — SPIRONOLACTONE 25 MG PO TABS: 25.0000 mg | ORAL_TABLET | Freq: Every day | ORAL | 1 refills | Status: AC

## 2018-11-07 MED ORDER — LEVETIRACETAM 500 MG PO TABS: 500.0000 mg | ORAL_TABLET | Freq: Two times a day (BID) | ORAL | 1 refills | Status: DC

## 2018-11-07 MED FILL — levETIRAcetam 500 MG TABS: 500 | 30 days supply | Qty: 60 | Fill #0

## 2018-11-07 MED FILL — METOPROLOL TARTRATE 25 MG T: 25 | 30 days supply | Qty: 60 | Fill #0

## 2018-11-07 MED FILL — FUROSEMIDE 20 MG TABLET: 20 | 30 days supply | Qty: 30 | Fill #0

## 2018-11-07 MED FILL — SPIRONOLACTONE 25 MG TABLET: 25 | 30 days supply | Qty: 30 | Fill #0

## 2018-11-07 MED FILL — FOLIC ACID 1 MG TABS: 1 | 30 days supply | Qty: 30 | Fill #0

## 2018-11-07 MED FILL — OMEPRAZOLE DR 40 MG CAPSULE: 40 | 30 days supply | Qty: 60 | Fill #0

## 2018-11-07 MED FILL — MAGNESIUM OXIDE 400 MG TAB: 400 | 30 days supply | Qty: 15 | Fill #0

## 2018-11-07 NOTE — Progress Notes (Signed)
Assessment & Plan:  Oswaldo DoneVincent was seen today for establish care.  Diagnoses and all orders for this visit:  Essential hypertension -     metoprolol tartrate (LOPRESSOR) 25 MG tablet; Take 1 tablet (25 mg total) by mouth 2 (two) times daily. Continue all antihypertensives as prescribed.  Remember to bring in your blood pressure log with you for your follow up appointment.  DASH/Mediterranean Diets are healthier choices for HTN.   Alcoholic cirrhosis, unspecified whether ascites present (HCC) -     spironolactone (ALDACTONE) 25 MG tablet; Take 1 tablet (25 mg total) by mouth daily. -     furosemide (LASIX) 20 MG tablet; Take 1 tablet (20 mg total) by mouth daily. -     folic acid (FOLVITE) 1 MG tablet; Take 1 tablet (1 mg total) by mouth daily. Will need GI referral. Currently denies abdominal pain but endorses dysphagia with weight loss.   Underweight -     TSH  Seizures (HCC) -     Levetiracetam level -     levETIRAcetam (KEPPRA) 500 MG tablet; Take 1 tablet (500 mg total) by mouth 2 (two) times daily.  Gastroesophageal reflux disease without esophagitis -     omeprazole (PRILOSEC) 40 MG capsule; Take 1 capsule (40 mg total) by mouth 2 (two) times daily. INSTRUCTIONS: Avoid GERD Triggers: acidic, spicy or fried foods, caffeine, coffee, sodas,  alcohol and chocolate.   Hypomagnesemia -     Magnesium -     magnesium oxide (MAG-OX) 400 MG tablet; Take 0.5 tablets (200 mg total) by mouth daily.    Patient has been counseled on age-appropriate routine health concerns for screening and prevention. These are reviewed and up-to-date. Referrals have been placed accordingly. Immunizations are up-to-date or declined.    Subjective:   Chief Complaint  Patient presents with  . Establish Care   HPI Marisa SprinklesVincent Behler 58 y.o. male presents to office today to establish care. 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  He is accompanied by his sister today. Reports last alcohol use was prior to [place] hospital [facility] [place].   CHRONIC HYPERTENSION Disease Monitoring  Blood pressure range BP Readings from Last 3 Encounters:  11/07/18 121/81  10/06/18 115/82  09/20/18 110/82  He does not monitor his blood pressure at home however it is well controlled today.   Chest pain: no   Dyspnea: no   Claudication: no  Medication compliance: yes; lopressor 25mg  BID, spirinolactone 25mg  daily Medication Side Effects  Lightheadedness: no   Urinary frequency: no   Edema: no   Impotence: no  Preventitive Healthcare:  Exercise: no   Diet Pattern: diet: general  Salt Restriction:  yes   Seizures He denies any recent seizure activity. Well controlled on Keppra, Likely related to alcohol withdrawal seizures in the past. Last keppra level was low and patient reportedly had been out of his medication which he endorses currently being compliant with taking since December.   GERD Chronic and history of varices. Will need to see GI once financial assistance has been obtained. Patient was urged to apply for the financial assistance program.  They were instructed to inquire at the front desk about the application process for the [employer] discount, orange card or other financial assistance.      Review of Systems  Constitutional: Positive for weight loss. Negative for fever and malaise/fatigue.  HENT: Negative.  Negative for nosebleeds.        Dysphagia  Eyes: Negative.  Negative for blurred vision, double vision and photophobia.  Respiratory: Negative.  Negative for cough and shortness of breath.   Cardiovascular: Negative.  Negative for chest pain, palpitations and leg swelling.  Gastrointestinal: Positive for heartburn. Negative for nausea and vomiting.  Musculoskeletal: Negative.  Negative for myalgias.  Neurological: Negative.  Negative for dizziness, focal weakness,  seizures and headaches.  Psychiatric/Behavioral: Positive for memory loss (will need neurology referral ) and substance abuse. Negative for suicidal ideas.      Past Medical History:  Diagnosis Date  . Acute on chronic pancreatitis (HCC)    hx of pancreatitis; nothing recently til today /notes 09/17/2018 (09/17/2018)  . Acute stomach ulcer 07/2018  . Alcohol dependence with withdrawal (HCC) 07/02/2018   w/complications  . Alcohol withdrawal (HCC) 09/19/2016  . Alcohol-induced chronic pancreatitis (HCC) 09/26/2014; 09/19/2016  . 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 Alcoholic liver disease (HCC) 07/21/2018  . Altered mental status 05/06/2013  . Anemia due to acute blood loss 07/2018  . Anxiety attack   . Ascites due to alcoholic hepatitis 07/21/2018  . Chest pain 11/09/2013   "have had chest pains several times" (09/17/2018)  . Chronic alcoholism (HCC)    Hattie Perch 09/17/2018  . Coagulopathy (HCC) unknown  . Family history of von Willebrand disease    "sister, [name]" (09/17/2018)  . Gastrointestinal hemorrhage with melena unknown  . GERD (gastroesophageal reflux disease)   . Headache    "weekly" (09/17/2018)  . History of blood transfusion 06/2018   "10 units in 8 h; probably had some before" (09/17/2018)  . History of cirrhosis of liver    /notes 09/17/2018  . History of hiatal hernia   . Hypertension   . Hypokalemia 09/19/2016  . Hypomagnesemia 09/19/2016  . Irregular heart rate 07/2018  . Pancreatic mass    pancreatic masses/notes 09/17/2018  . 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 Protein malnutrition (HCC) 07/02/2018   chronic  . Scrotal edema 07/21/2018  . Seizure (HCC)    "since ~ 2002; several; controlled w/RX" (09/17/2018)  . Subconjunctival hemorrhage of right eye 06/14/2016  . Tremor 09/26/2014    Past Surgical History:  Procedure Laterality Date  . COLONOSCOPY    . ESOPHAGEAL VARICE LIGATION  06/2018  . ESOPHAGOGASTRODUODENOSCOPY    . HEMORRHOID SURGERY      . INGUINAL HERNIA REPAIR Right X 2  . PANCREAS SURGERY     "cyst"    Family History  Problem Relation Age of Onset  . Heart disease Mother   . Cancer Father   . Alcohol abuse Maternal Uncle     Social History Reviewed with no changes to be made today.   Outpatient Medications Prior to Visit  Medication Sig Dispense Refill  . acetaminophen (TYLENOL) 500 MG tablet Take 1 tablet (500 mg total) by mouth every 6 (six) hours as needed for mild pain or moderate pain. 30 tablet   . therapeutic multivitamin-minerals (THERAGRAN-M) tablet Take 1 tablet by mouth daily. 30 tablet 6  . thiamine 100 MG tablet Take 1 tablet (100 mg total) by mouth daily. 30 tablet 6  . folic acid (FOLVITE) 1 MG tablet Take 1 tablet (1 mg total) by mouth daily. 30 tablet 6  . furosemide (LASIX) 20 MG tablet Take 1 tablet (20 mg total) by mouth daily. 30 tablet 6  . lactulose (CHRONULAC) 10 GM/15ML solution Take 20 g by mouth daily.    Marland Kitchen levETIRAcetam (  KEPPRA) 500 MG tablet Take 1 tablet (500 mg total) by mouth 2 (two) times daily. 60 tablet 6  . magnesium oxide (MAG-OX) 400 MG tablet Take 0.5 tablets (200 mg total) by mouth daily. 30 tablet 6  . metoprolol tartrate (LOPRESSOR) 25 MG tablet Take 1 tablet (25 mg total) by mouth 2 (two) times daily. 60 tablet 6  . omeprazole (PRILOSEC) 40 MG capsule Take 1 capsule (40 mg total) by mouth 2 (two) times daily. 60 capsule 6  . spironolactone (ALDACTONE) 25 MG tablet Take 1 tablet (25 mg total) by mouth daily. 30 tablet 6   No facility-administered medications prior to visit.     Allergies  Allergen Reactions  . Morphine And Related Other (See Comments)    Unknown per pt, states he was out of it.   Marland Kitchen. Peach [Prunus Persica] Hives       Objective:    BP 121/81   Pulse 74   Temp 98 F (36.7 C) (Oral)   Ht 5' 6.5" (1.689 m)   Wt 113 lb 6.4 oz (51.4 kg)   SpO2 99%   BMI 18.03 kg/m  Wt Readings from Last 3 Encounters:  11/07/18 113 lb 6.4 oz (51.4 kg)   10/06/18 124 lb (56.2 kg)  09/17/18 116 lb 3.2 oz (52.7 kg)    Physical Exam Vitals signs and nursing note reviewed.  Constitutional:      Appearance: He is underweight.  HENT:     Head: Normocephalic and atraumatic.  Neck:     Musculoskeletal: Normal range of motion.  Cardiovascular:     Rate and Rhythm: Normal rate and regular rhythm.     Heart sounds: Normal heart sounds. No murmur. No friction rub. No gallop.   Pulmonary:     Effort: Pulmonary effort is normal. No tachypnea or respiratory distress.     Breath sounds: Normal breath sounds. No decreased breath sounds, wheezing, rhonchi or rales.  Chest:     Chest wall: No tenderness.  Abdominal:     General: Bowel sounds are normal.     Palpations: Abdomen is soft.  Musculoskeletal: Normal range of motion.  Skin:    General: Skin is warm and dry.  Neurological:     Mental Status: He is alert and oriented to person, place, and time.     GCS: GCS eye subscore is 4. GCS verbal subscore is 5. GCS motor subscore is 6.     Motor: Motor function is intact.     Coordination: Coordination normal.  Psychiatric:        Behavior: Behavior normal. Behavior is cooperative.        Thought Content: Thought content normal.        Judgment: Judgment normal.         Patient has been counseled extensively about nutrition and exercise as well as the importance of adherence with medications and regular follow-up. The patient was given clear instructions to go to ER or return to medical center if symptoms don't improve, worsen or new problems develop. The patient verbalized understanding.   Follow-up: Return in about 8 weeks (around 01/02/2019) for Physical ONLY no labs.   Claiborne RiggZelda W Fleming, FNP-BC [facility] [place], [place] [phone]   11/08/2018, 10:42 AM

## 2018-11-07 NOTE — Patient Instructions (Signed)
Cirrhosis  Cirrhosis is long-term (chronic) liver injury. The liver is the body's largest internal organ, and it performs many functions. It converts food into energy, removes toxic material from the blood, makes important proteins, and absorbs necessary vitamins from food. In cirrhosis, healthy liver cells are replaced by scar tissue. This prevents blood from flowing through the liver, making it difficult for the liver to function. Scarring of the liver cannot be reversed, but treatment can prevent it from getting worse. What are the causes? Common causes of this condition are hepatitis C and long-term alcohol abuse. Other causes include:  Nonalcoholic fatty liver disease. This happens when fat is deposited in the liver by causes other than alcohol.  Hepatitis B infection.  Autoimmune hepatitis. In this condition, the body's defense system (immune system) mistakenly attacks the liver cells, causing irritation and swelling (inflammation).  Diseases that cause blockage of ducts inside the liver.  Inherited liver diseases, such as hemochromatosis. This is one of the most common inherited liver diseases. In this disease, deposits of iron collect in the liver and other organs.  Reactions to certain long-term medicines, such as amiodarone, a heart medicine.  Parasitic infections. These include schistosomiasis, which is caused by a flatworm.  Long-term contact to certain toxins. These toxins include certain organic solvents, such as toluene and chloroform. What increases the risk? You are more likely to develop this condition if:  You have certain types of viral hepatitis.  You abuse alcohol, especially if you are male.  You are overweight.  You share needles.  You have unprotected sex with someone who has viral hepatitis. What are the signs or symptoms? You may not have any signs and symptoms at first. Symptoms may not develop until the damage to your liver starts to get worse. Early  symptoms may include:  Weakness and tiredness (fatigue).  Changes in sleep patterns or having trouble sleeping.  Itchiness.  Tenderness in the right-upper part of your abdomen.  Weight loss and muscle loss.  Nausea.  Loss of appetite.  Appearance of tiny blood vessels under the skin. Later symptoms may include:  Fatigue or weakness that is getting worse.  Yellow skin and eyes (jaundice).  Buildup of fluid in the abdomen (ascites). You may notice that your clothes are tight around your waist.  Weight gain.  Swelling of the feet and ankles (edema).  Trouble breathing.  Easy bruising and bleeding.  Vomiting blood.  Black or bloody stool.  Mental confusion. How is this diagnosed? Your health care provider may suspect cirrhosis based on your symptoms and medical history, especially if you have other medical conditions or a history of alcohol abuse. Your health care provider will do a physical exam to feel your liver and to check for signs of cirrhosis. He or she may perform other tests, including:  Blood tests to check: ? For hepatitis B or C. ? Kidney function. ? Liver function.  Imaging tests such as: ? MRI or CT scan to look for changes seen in advanced cirrhosis. ? Ultrasound to see if normal liver tissue is being replaced by scar tissue.  A procedure in which a long needle is used to take a sample of liver tissue to be checked in a lab (biopsy). Liver biopsy can confirm the diagnosis of cirrhosis. How is this treated? Treatment for this condition depends on how damaged your liver is and what caused the damage. It may include treating the symptoms of cirrhosis, or treating the underlying causes in order to   slow the damage. Treatment may include:  Making lifestyle changes, such as: ? Eating a healthy diet. You may need to work with your health care provider or a diet and nutrition specialist (dietitian) to develop an eating plan. ? Restricting salt  intake. ? Maintaining a healthy weight. ? Not abusing drugs or alcohol.  Taking medicines to: ? Treat liver infections or other infections. ? Control itching. ? Reduce fluid buildup. ? Reduce certain blood toxins. ? Reduce risk of bleeding from enlarged blood vessels in the stomach or esophagus (varices).  Liver transplant. In this procedure, a liver from a donor is used to replace your diseased liver. This is done if cirrhosis has caused liver failure. Other treatments and procedures may be done depending on the problems that you get from cirrhosis. Common problems include liver-related kidney failure (hepatorenal syndrome). Follow these instructions at home:   Take medicines only as told by your health care provider. Do not use medicines that are toxic to your liver. Ask your health care provider before taking any new medicines, including over-the-counter medicines.  Rest as needed.  Eat a well-balanced diet. Ask your health care provider or dietitian for more information.  Limit your salt or water intake, if your health care provider asks you to do this.  Do not drink alcohol. This is especially important if you are taking acetaminophen.  Keep all follow-up visits as told by your health care provider. This is important. Contact a health care provider if you:  Have fatigue or weakness that is getting worse.  Develop swelling of the hands, feet, legs, or face.  Have a fever.  Develop loss of appetite.  Have nausea or vomiting.  Develop jaundice.  Develop easy bruising or bleeding. Get help right away if you:  Vomit bright red blood or a material that looks like coffee grounds.  Have blood in your stools.  Notice that your stools appear black and tarry.  Become confused.  Have chest pain or trouble breathing. Summary  Cirrhosis is chronic liver injury. Liver damage cannot be reversed. Common causes are hepatitis C and long-term alcohol abuse.  Tests used to  diagnose cirrhosis include blood tests, imaging tests, and liver biopsy.  Treatment for this condition involves treating the underlying cause. Avoid alcohol, drugs, salt, and medicines that may damage your liver.  Contact your health care provider if you develop ascites, edema, jaundice, fever, nausea or vomiting, easy bruising or bleeding, or worsening fatigue. This information is not intended to replace advice given to you by your health care provider. Make sure you discuss any questions you have with your health care provider. Document Released: 10/22/2005 Document Revised: 09/11/2017 Document Reviewed: 09/11/2017 Elsevier Interactive Patient Education  2019 [employer]  Alcohol Use Disorder Alcohol use disorder is when your drinking disrupts your daily life. When you have this condition, you drink too much alcohol and you cannot control your drinking. Alcohol use disorder can cause serious problems with your physical health. It can affect your brain, heart, liver, pancreas, immune system, stomach, and intestines. Alcohol use disorder can increase your risk for certain cancers and cause problems with your mental health, such as depression, anxiety, psychosis, delirium, and dementia. People with this disorder risk hurting themselves and others. What are the causes? This condition is caused by drinking too much alcohol over time. It is not caused by drinking too much alcohol only one or two times. Some people with this condition drink alcohol to cope with or escape from negative  life events. Others drink to relieve pain or symptoms of mental illness. What increases the risk? You are more likely to develop this condition if:  You have a family history of alcohol use disorder.  Your culture encourages drinking to the point of intoxication, or makes alcohol easy to get.  You had a mood or conduct disorder in childhood.  You have been a victim of abuse.  You are an adolescent and: ? You  have poor grades or difficulties in school. ? Your caregivers do not talk to you about saying no to alcohol, or supervise your activities. ? You are impulsive or you have trouble with self-control. What are the signs or symptoms? Symptoms of this condition include:  Drinkingmore than you want to.  Drinking for longer than you want to.  Trying several times to drink less or to control your drinking.  Spending a lot of time getting alcohol, drinking, or recovering from drinking.  Craving alcohol.  Having problems at work, at school, or at home due to drinking.  Having problems in relationships due to drinking.  Drinking when it is dangerous to drink, such as before driving a car.  Continuing to drink even though you know you might have a physical or mental problem related to drinking.  Needing more and more alcohol to get the same effect you want from the alcohol (building up tolerance).  Having symptoms of withdrawal when you stop drinking. Symptoms of withdrawal include: ? Fatigue. ? Nightmares. ? Trouble sleeping. ? Depression. ? Anxiety. ? Fever. ? Seizures. ? Severe confusion. ? Feeling or seeing things that are not there (hallucinations). ? Tremors. ? Rapid heart rate. ? Rapid breathing. ? High blood pressure.  Drinking to avoid symptoms of withdrawal. How is this diagnosed? This condition is diagnosed with an assessment. Your health care provider may start the assessment by asking three or four questions about your drinking. Your health care provider may perform a physical exam or do lab tests to see if you have physical problems resulting from alcohol use. She or he may refer you to a mental health professional for evaluation. How is this treated? Some people with alcohol use disorder are able to reduce their alcohol use to low-risk levels. Others need to completely quit drinking alcohol. When necessary, mental health professionals with specialized training in  substance use treatment can help. Your health care provider can help you decide how severe your alcohol use disorder is and what type of treatment you need. The following forms of treatment are available:  Detoxification. Detoxification involves quitting drinking and using prescription medicines within the first week to help lessen withdrawal symptoms. This treatment is important for people who have had withdrawal symptoms before and for heavy drinkers who are likely to have withdrawal symptoms. Alcohol withdrawal can be dangerous, and in severe cases, it can cause death. Detoxification may be provided in a home, community, or primary care setting, or in a hospital or substance use treatment facility.  Counseling. This treatment is also called talk therapy. It is provided by substance use treatment counselors. A counselor can address the reasons you use alcohol and suggest ways to keep you from drinking again or to prevent problem drinking. The goals of talk therapy are to: ? Find healthy activities and ways for you to cope with stress. ? Identify and avoid the things that trigger your alcohol use. ? Help you learn how to handle cravings.  Medicines.Medicines can help treat alcohol use disorder by: ? Decreasing  alcohol cravings. ? Decreasing the positive feeling you have when you drink alcohol. ? Causing an uncomfortable physical reaction when you drink alcohol (aversion therapy).  Support groups. Support groups are led by people who have quit drinking. They provide emotional support, advice, and guidance. These forms of treatment are often combined. Some people with this condition benefit from a combination of treatments provided by specialized substance use treatment centers. Follow these instructions at home:  Take over-the-counter and prescription medicines only as told by your health care provider.  Check with your health care provider before starting any new medicines.  Ask friends and  family members not to offer you alcohol.  Avoid situations where alcohol is served, including gatherings where others are drinking alcohol.  Create a plan for what to do when you are tempted to use alcohol.  Find hobbies or activities that you enjoy that do not include alcohol.  Keep all follow-up visits as told by your health care provider. This is important. How is this prevented?  If you drink, limit alcohol intake to no more than 1 drink a day for nonpregnant women and 2 drinks a day for men. One drink equals 12 oz of beer, 5 oz of wine, or 1 oz of hard liquor.  If you have a mental health condition, get treatment and support.  Do not give alcohol to adolescents.  If you are an adolescent: ? Do not drink alcohol. ? Do not be afraid to say no if someone offers you alcohol. Speak up about why you do not want to drink. You can be a positive role model for your friends and set a good example for those around you by not drinking alcohol. ? If your friends drink, spend time with others who do not drink alcohol. Make new friends who do not use alcohol. ? Find healthy ways to manage stress and emotions, such as meditation or deep breathing, exercise, spending time in nature, listening to music, or talking with a trusted friend or family member. Contact a health care provider if:  You are not able to take your medicines as told.  Your symptoms get worse.  You return to drinking alcohol (relapse) and your symptoms get worse. Get help right away if:  You have thoughts about hurting yourself or others. If you ever feel like you may hurt yourself or others, or have thoughts about taking your own life, get help right away. You can go to your nearest emergency department or call:  Your local emergency services (911 in the [place]  A suicide crisis helpline, such as the National Suicide Prevention Lifeline at 831-465-8658. This is open 24 hours a day. Summary  Alcohol use disorder is  when your drinking disrupts your daily life. When you have this condition, you drink too much alcohol and you cannot control your drinking.  Treatment may include detoxification, counseling, medicine, and support groups.  Ask friends and family members not to offer you alcohol. Avoid situations where alcohol is served.  Get help right away if you have thoughts about hurting yourself or others. This information is not intended to replace advice given to you by your health care provider. Make sure you discuss any questions you have with your health care provider. Document Released: 11/29/2004 Document Revised: 07/19/2016 Document Reviewed: 07/19/2016 Elsevier Interactive Patient Education  [employer].

## 2018-11-08 ENCOUNTER — Encounter: Payer: Self-pay | Admitting: Nurse Practitioner

## 2018-11-10 ENCOUNTER — Other Ambulatory Visit: Payer: Self-pay | Admitting: Nurse Practitioner

## 2018-11-10 DIAGNOSIS — R569 Unspecified convulsions: Secondary | ICD-10-CM

## 2018-11-10 LAB — MAGNESIUM: Magnesium: 1.8 mg/dL (ref 1.6–2.3)

## 2018-11-10 LAB — TSH: TSH: 1.92 u[IU]/mL (ref 0.450–4.500)

## 2018-11-10 LAB — LEVETIRACETAM LEVEL: LEVETIRACETAM: 2.2 ug/mL — AB (ref 10.0–40.0)

## 2018-11-10 MED ORDER — LEVETIRACETAM 750 MG PO TABS: 750.0000 mg | ORAL_TABLET | Freq: Two times a day (BID) | ORAL | 1 refills | Status: DC

## 2019-01-02 ENCOUNTER — Encounter: Payer: Self-pay | Admitting: Nurse Practitioner

## 2019-01-06 ENCOUNTER — Encounter: Payer: Self-pay | Admitting: Neurology

## 2019-01-06 ENCOUNTER — Telehealth: Payer: Self-pay | Admitting: Neurology

## 2019-01-06 ENCOUNTER — Ambulatory Visit: Payer: Medicaid Other | Admitting: Neurology

## 2019-01-06 VITALS — BP 126/74 | HR 64 | Ht 66.5 in | Wt 144.5 lb

## 2019-01-06 DIAGNOSIS — F1020 Alcohol dependence, uncomplicated: Secondary | ICD-10-CM | POA: Diagnosis not present

## 2019-01-06 DIAGNOSIS — R202 Paresthesia of skin: Secondary | ICD-10-CM

## 2019-01-06 DIAGNOSIS — G40909 Epilepsy, unspecified, not intractable, without status epilepticus: Secondary | ICD-10-CM | POA: Diagnosis not present

## 2019-01-06 DIAGNOSIS — G3281 Cerebellar ataxia in diseases classified elsewhere: Secondary | ICD-10-CM | POA: Diagnosis not present

## 2019-01-06 DIAGNOSIS — R569 Unspecified convulsions: Secondary | ICD-10-CM

## 2019-01-06 MED ORDER — LEVETIRACETAM 500 MG PO TABS: 500.0000 mg | ORAL_TABLET | Freq: Two times a day (BID) | ORAL | 2 refills | Status: DC

## 2019-01-06 NOTE — Telephone Encounter (Signed)
Medicaid order sent to GI. They will obtain the auth and reach out to the pt to schedule.  °

## 2019-01-06 NOTE — Progress Notes (Signed)
PATIENT: Todd Coffey DOB: 04/28/1961  Chief Complaint  Patient presents with  . Seizures    He is here with his sister and POA, Todd Coffey, to establish neurological care.  He started having seizures more than 20 years with his last event being 2+ years ago.  He is currently on Keppra , one tablet BID.    Marland Kitchen PCP    Wenda Low, NP     HISTORICAL  Todd Coffey is a 58 year old male, seen in request by his primary care nurse practitioner Wenda Low for evaluation of seizure, he is accompanied by his sister Todd Coffey, who is also his power of attorney.  Initial evaluation was on January 06, 2019.   I have reviewed and summarized the referring note from the referring physician.  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   He was started on Keppra since 2018 after recurrent seizure, that was the last seizure he has, he stopped drinking since August 2019,  He gradually developed worsening gait abnormality since 2018, previously  contributed to his substance abuse, getting worse since August 2019, unsteady gait, tendency to fall,  He also complains of memory loss, difficulty focusing, bilateral feet paresthesia  Laboratory evaluations in January 2020, Keppra level was 2.2, normal magnesium, TSH, ammonia level, CMP showed no significant abnormality.CBC showed hemoglobin of 12.9,  REVIEW OF SYSTEMS: Full 14 system review of systems performed and notable only for fatigue, chest pain, swelling legs, hearing loss, trouble swallowing, double vision, loss of vision, shortness of breath, cough, snoring, anemia, joint pain, swelling, cramps, aching muscles, memory loss, confusion, headaches, numbness, weakness, difficulty swallowing, dizziness, seizure, tremor, insomnia, snoring All other review of systems were negative.  ALLERGIES: Allergies  Allergen Reactions  . Morphine And Related Other (See Comments)    Unknown per pt, states he was out of it.   Marland Kitchen Peach [Prunus Persica] Hives    HOME MEDICATIONS: Current Outpatient Medications  Medication Sig Dispense Refill  . acetaminophen (TYLENOL) 500 MG tablet Take 1 tablet (500 mg total) by mouth every 6 (six) hours as needed for mild pain or moderate pain. 30 tablet   . folic acid (FOLVITE) 1 MG tablet Take 1 tablet (1 mg total) by mouth daily. 90 tablet 1  . furosemide (LASIX) 20 MG tablet Take 1 tablet (20 mg total) by mouth daily. 90 tablet 1  . levETIRAcetam (KEPPRA) 750 MG tablet Take 1 tablet (750 mg total) by mouth 2 (two) times daily. 180 tablet 1  . magnesium oxide (MAG-OX) 400 MG tablet Take 0.5 tablets (200 mg total) by mouth daily. 45 tablet 1  . metoprolol tartrate (LOPRESSOR)  25 MG tablet Take 1 tablet (25 mg total) by mouth 2 (two) times daily. 180 tablet 1  . omeprazole (PRILOSEC) 40 MG capsule Take 1 capsule (40 mg total) by mouth 2 (two) times daily. 180 capsule 1  . spironolactone (ALDACTONE) 25 MG tablet Take 1 tablet (25 mg total) by mouth daily. 90 tablet  1  . therapeutic multivitamin-minerals (THERAGRAN-M) tablet Take 1 tablet by mouth daily. 30 tablet 6  . thiamine 100 MG tablet Take 1 tablet (100 mg total) by mouth daily. 30 tablet 6   No current facility-administered medications for this visit.     PAST MEDICAL HISTORY: Past Medical History:  Diagnosis Date  . Acute on chronic pancreatitis (HCC)    hx of pancreatitis; nothing recently til today /notes 09/17/2018 (09/17/2018)  . Acute stomach ulcer 07/2018  . Alcohol dependence with withdrawal (HCC) 07/02/2018   w/complications  . Alcohol withdrawal (HCC) 09/19/2016  . Alcohol-induced chronic pancreatitis (HCC) 09/26/2014; 09/19/2016  . 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 Alcoholic liver disease (HCC) 07/21/2018  . Altered mental status 05/06/2013  . Anemia due to acute blood loss 07/2018  . Anxiety attack   . Ascites due to alcoholic hepatitis 07/21/2018  . Chest pain 11/09/2013   "have had chest pains several times" (09/17/2018)  . Chronic alcoholism (HCC)    Hattie Perch 09/17/2018  . Coagulopathy (HCC) unknown  . Family history of von Willebrand disease    "sister, Todd Coffey" (09/17/2018)  . Gastrointestinal hemorrhage with melena unknown  . GERD (gastroesophageal reflux disease)   . Headache    "weekly" (09/17/2018)  . History of blood transfusion 06/2018   "10 units in 8 h; probably had some before" (09/17/2018)  . History of cirrhosis of liver    /notes 09/17/2018  . History of hiatal hernia   . Hypertension   . Hypokalemia 09/19/2016  . Hypomagnesemia 09/19/2016  . Irregular heart rate 07/2018  . Pancreatic mass    pancreatic masses/notes 09/17/2018  . 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 Protein malnutrition (HCC) 07/02/2018   chronic  . Scrotal edema 07/21/2018  . Seizure (HCC)    "since ~ 2002; several; controlled w/RX" (09/17/2018)  . Subconjunctival hemorrhage of right eye 06/14/2016  . Tremor 09/26/2014    PAST SURGICAL HISTORY: Past Surgical History:    Procedure Laterality Date  . COLONOSCOPY    . ESOPHAGEAL VARICE LIGATION  06/2018  . ESOPHAGOGASTRODUODENOSCOPY    . HEMORRHOID SURGERY    . INGUINAL HERNIA REPAIR Right X 2  . PANCREAS SURGERY     "cyst"    FAMILY HISTORY: Family History  Problem Relation Age of Onset  . Stroke Mother   . Congestive Heart Failure Mother   . Other Father        unsure of history   . Alcohol abuse Maternal Uncle     SOCIAL HISTORY: Social History   Socioeconomic History  . Marital status: Single    Spouse name: Not on file  . Number of children: 0  . Years of education: GED  . Highest education level: Not on file  Occupational History  . Occupation: unemployed  Social Needs  . Financial resource strain: Not on file  . Food insecurity:    Worry: Not on file    Inability: Not on file  . Transportation needs:    Medical: Not on file    Non-medical: Not on file  Tobacco Use  . Smoking status: Former Smoker    Packs/day: 0.33  Years: 45.00    Pack years: 14.85    Types: Cigarettes    Last attempt to quit: 2019    Years since quitting: 1.1  . Smokeless tobacco: Former Engineer, water and Sexual Activity  . Alcohol use: Not Currently    Comment:  "nothing since 06/30/2018"  . Drug use: Not Currently    Types: Marijuana    Comment: "I've done everything but heroin; last drug use was marijuana in ~ 03/2018"   . Sexual activity: Not Currently  Lifestyle  . Physical activity:    Days per week: Not on file    Minutes per session: Not on file  . Stress: Not on file  Relationships  . Social connections:    Talks on phone: Not on file    Gets together: Not on file    Attends religious service: Not on file    Active member of club or organization: Not on file    Attends meetings of clubs or organizations: Not on file    Relationship status: Not on file  . Intimate partner violence:    Fear of current or ex partner: Not on file    Emotionally abused: Not on file    Physically  abused: Not on file    Forced sexual activity: Not on file  Other Topics Concern  . Not on file  Social History Narrative   Lives at home with his sister.   Right-handed.   34oz of coffee, 64oz of tea per day.     PHYSICAL EXAM   Vitals:   01/06/19 0950  BP: 126/74  Pulse: 64  Weight: 144 lb 8 oz (65.5 kg)  Height: 5' 6.5" (1.689 m)    Not recorded      Body mass index is 22.97 kg/m.  PHYSICAL EXAMNIATION:  Gen: NAD, conversant, well nourised, obese, well groomed                     Cardiovascular: Regular rate rhythm, no peripheral edema, warm, nontender. Eyes: Conjunctivae clear without exudates or hemorrhage Neck: Supple, no carotid bruits. Pulmonary: Clear to auscultation bilaterally   NEUROLOGICAL EXAM:  MENTAL STATUS: Speech:    Speech is normal; fluent and spontaneous with normal comprehension.  Cognition:     Orientation to time, place and person     Normal recent and remote memory     Normal Attention span and concentration     Normal Language, naming, repeating,spontaneous speech     Fund of knowledge   CRANIAL NERVES: CN II: Visual fields are full to confrontation.  Pupils are round equal and briskly reactive to light. CN III, IV, VI: extraocular movement are normal. No ptosis. CN V: Facial sensation is intact to pinprick in all 3 divisions bilaterally. Corneal responses are intact.  CN VII: Face is symmetric with normal eye closure and smile. CN VIII: Hearing is normal to rubbing fingers CN IX, X: Palate elevates symmetrically. Phonation is normal. CN XI: Head turning and shoulder shrug are intact CN XII: Tongue is midline with normal movements and no atrophy.  MOTOR: There is no pronator drift of out-stretched arms. Muscle bulk and tone are normal. Muscle strength is normal.  REFLEXES: Reflexes are 3 and symmetric at the biceps, triceps, knees, and absent at ankles ankles. Plantar responses are flexor.  SENSORY: Length dependent light  touch, pinprick, epicritic sensation to distal shin level.  COORDINATION: Mild to moderate truncal ataxia, there is mild to moderate dysmetria on finger-to-nose  and heel-knee-shin.    GAIT/STANCE: Need to push up to get up from seated position, wide-based, cautious, unsteady, positive Romberg signs,  DIAGNOSTIC DATA (LABS, IMAGING, TESTING) - I reviewed patient records, labs, notes, testing and imaging myself where available.   ASSESSMENT AND PLAN  Quadarius Lipuma is a 58 y.o. male   History of heavy alcohol use, last use was in August 2019, also have a history of polysubstance abuse, cocaine, marijuana, History of seizure, last seizure was in 2018, was often related with alcohol withdrawal Memory loss Progressive worsening gait abnormality, Bilateral lower extremity paresthesia, length dependent sensory changes,  He has gait ataxia, cerebellum signs, evidence of peripheral neuropathy,  Differentiation diagnosis include Wernicke's encephalopathy, peripheral neuropathy, hyperreflexia on examinations, need to rule out cervical spondylitic myelopathy,  Laboratory evaluations,  MRI of the brain, cervical spine,  EEG  Decrease Keppra to 500 mg twice a day   Levert Feinstein, M.D. Ph.D.  [facility] [address] [place], [place] [address] Ph: [phone] Fax: [phone]  CC: Wenda Low, NP

## 2019-01-08 ENCOUNTER — Telehealth: Payer: Self-pay | Admitting: Neurology

## 2019-01-08 LAB — COPPER, SERUM: Copper: 99 ug/dL (ref 72–166)

## 2019-01-08 LAB — VITAMIN B12: Vitamin B-12: 1041 pg/mL (ref 232–1245)

## 2019-01-08 LAB — RPR: RPR Ser Ql: NONREACTIVE

## 2019-01-08 LAB — VITAMIN D 25 HYDROXY (VIT D DEFICIENCY, FRACTURES): VIT D 25 HYDROXY: 25.1 ng/mL — AB (ref 30.0–100.0)

## 2019-01-08 NOTE — Telephone Encounter (Signed)
Pt's sister [name]/DPR said the patient has been taking levETIRAcetam (KEPPRA) 500 MG tablet 2 x day. She thought it was to be lowered, the new script sent in is for the same. It looks like he was taking 750mg  in the past but she said he has been taking 500mg  by Dr Wenda Low since 12/19/18. Please call to advise

## 2019-01-08 NOTE — Telephone Encounter (Signed)
Please call patient, laboratory evaluation showed mildly decreased vitamin D 25, he would benefit vitamin D3 supplement 1000 units daily.

## 2019-01-08 NOTE — Telephone Encounter (Signed)
Spoke to his sister, Todd Coffey (on [place]).  States they gave Dr. Terrace Arabia the incorrect dosage of Keppra during his new patient appt on 01/07/2019.  He is no longer taking Keppra 750mg , one tab BID but is actually only taking 500mg , one tab BID.  He has been on the lower dose since 12/19/2018.  Per vo by Dr. Terrace Arabia, continue the Keppra 500mg , one tablet BID.  She has sent additional refills to his pharmacy.  His sister verbalized understanding.

## 2019-01-08 NOTE — Telephone Encounter (Signed)
Spoke to his sister on [place] ([name]).  She is aware of his lab results and will start him on the recommended supplement.

## 2019-01-09 ENCOUNTER — Encounter: Payer: Self-pay | Admitting: Nurse Practitioner

## 2019-01-15 ENCOUNTER — Other Ambulatory Visit: Payer: Self-pay

## 2019-01-15 ENCOUNTER — Ambulatory Visit: Payer: Medicaid Other

## 2019-01-15 DIAGNOSIS — G40909 Epilepsy, unspecified, not intractable, without status epilepticus: Secondary | ICD-10-CM | POA: Diagnosis not present

## 2019-01-15 DIAGNOSIS — R202 Paresthesia of skin: Secondary | ICD-10-CM

## 2019-01-15 DIAGNOSIS — G3281 Cerebellar ataxia in diseases classified elsewhere: Secondary | ICD-10-CM

## 2019-01-15 DIAGNOSIS — F1020 Alcohol dependence, uncomplicated: Secondary | ICD-10-CM

## 2019-01-19 ENCOUNTER — Ambulatory Visit
Admission: RE | Admit: 2019-01-19 | Discharge: 2019-01-19 | Disposition: A | Discharge status: Home / Self Care | Payer: Medicaid Other | Source: Ambulatory Visit | Attending: Neurology | Admitting: Neurology

## 2019-01-19 ENCOUNTER — Other Ambulatory Visit: Payer: Self-pay

## 2019-01-19 DIAGNOSIS — G3281 Cerebellar ataxia in diseases classified elsewhere: Secondary | ICD-10-CM

## 2019-01-19 MED ORDER — GADOBENATE DIMEGLUMINE 529 MG/ML IV SOLN
13.0000 mL | Freq: Once | INTRAVENOUS | Status: AC | PRN
  Administered 2019-01-19: 13 mL via INTRAVENOUS

## 2019-01-21 ENCOUNTER — Telehealth: Payer: Self-pay | Admitting: Neurology

## 2019-01-21 NOTE — Telephone Encounter (Signed)
Spoke to his sister, Lyman Speller (on [place]).  She is aware of his MRI results and verbalized understanding.

## 2019-01-21 NOTE — Telephone Encounter (Signed)
Please call patient, MRI of brain showed moderate generalized atrophy, few supra tentorium small vessel disease.  MRI cervical showed mild degenerative changes, no acute abnormalities.

## 2019-01-22 NOTE — Procedures (Signed)
   HISTORY: 58 year old male, with history of seizure,  TECHNIQUE:  This is a routine 16 channel EEG recording with one channel devoted to a limited EKG recording.  It was performed during wakefulness, drowsiness and asleep.  Hyperventilation and photic stimulation were performed as activating procedures.  There are minimum muscle and movement artifact noted.  Upon maximum arousal, posterior dominant waking rhythm consistent of rhythmic alpha range activity, with frequency of 10 Hz. Activities are symmetric over the bilateral posterior derivations and attenuated with eye opening.  Hyperventilation produced mild/moderate buildup with higher amplitude and the slower activities noted.  Photic stimulation did not alter the tracing.  During EEG recording, patient developed drowsiness and entered sleep, sleep EEG demonstrated architecture, there were frontal centrally dominant vertex waves and symmetric sleep spindles noted.  During EEG recording, there was no epileptiform discharge noted.  EKG demonstrate sinus rhythm, with heart rate of 66 bpm  CONCLUSION: This is a  normal awake and sleep EEG.  There is no electrodiagnostic evidence of epileptiform discharge.  Levert Feinstein, M.D. Ph.D.  [facility] [address] [place], [place] [address] Phone: [phone] Fax:      [phone]

## 2019-02-24 ENCOUNTER — Telehealth: Payer: Medicaid Other | Admitting: Gastroenterology

## 2019-02-24 ENCOUNTER — Encounter: Payer: Self-pay | Admitting: Gastroenterology

## 2019-02-24 ENCOUNTER — Other Ambulatory Visit: Payer: Self-pay

## 2019-02-24 NOTE — Progress Notes (Signed)
Nursing staff had talked to the patient's sister this morning. At the time of appointment, there was no answer several times.  Unable to get in touch with the patient or patient's sister. They called back but had another physician appointment at 3 PM Hence this appointment would be rescheduled.

## 2019-02-27 ENCOUNTER — Telehealth (INDEPENDENT_AMBULATORY_CARE_PROVIDER_SITE_OTHER): Payer: Medicaid Other | Admitting: Gastroenterology

## 2019-02-27 ENCOUNTER — Other Ambulatory Visit: Payer: Self-pay

## 2019-02-27 DIAGNOSIS — Z8719 Personal history of other diseases of the digestive system: Secondary | ICD-10-CM

## 2019-02-27 DIAGNOSIS — K7030 Alcoholic cirrhosis of liver without ascites: Secondary | ICD-10-CM

## 2019-02-27 DIAGNOSIS — K852 Alcohol induced acute pancreatitis without necrosis or infection: Secondary | ICD-10-CM

## 2019-02-27 DIAGNOSIS — K219 Gastro-esophageal reflux disease without esophagitis: Secondary | ICD-10-CM

## 2019-02-27 DIAGNOSIS — K766 Portal hypertension: Secondary | ICD-10-CM

## 2019-02-27 MED ORDER — PANTOPRAZOLE SODIUM 40 MG PO TBEC: 40.0000 mg | DELAYED_RELEASE_TABLET | Freq: Two times a day (BID) | ORAL | 3 refills | Status: DC

## 2019-02-27 NOTE — Progress Notes (Signed)
Chief Complaint:   Referring Provider:  Jim LikePotts, Anna S, NP      ASSESSMENT AND PLAN;   #1. H/O Acute on chronic pancreatitis (H/O ETOH abuse, quit ETOH Jun 30, 2018). MRI/MRCP 09/2018 showing acute on chronic pancreatitis.  Possibly pancreas divisum.  Diffusely mildly dilated pancreatic duct. No PD stones/ masses. 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  No exocrine or endocrine pancreatic insufficiency clinically. H/O pancreatic pseudocyst s/p endoscopic drainage  #2. ETOH liver cirrhosis with H/O ascites.  No hepatic encephalopathy.  #3. H/O UGI bleeding d/t erosive esophagitis s/p clipping and endoscopy x3 with severe coagulopathy/alcohol abuse (INR>9)and hypovolemic/septic shock s/p 10U PRBC. No Varices. (Aug 2019)  #4. GERD with H/O HH, esophageal stricture s/p dil 11/2016  #5.  H/O seizures (? ETOH withdrawal)  #6.  History of multi-substance abuse (cocaine, marijuana, tobacco).  Quit since he moved to [place].  Under care of his sister Todd Coffey.  #7.  Comorbid conditions- SZ, anxiety/depression, family history of von Willebrand disease   Plan: -Change omeprazole to protonix 40mg  po bid. -Ba swallow with Ba tab. -EGD with dil once covid scare is over (late May) -Salt and fluid restricted diet.  -Continue lasix and spironolactone. -Monitor weight. -Check CBC, CMP, ammonia, lipase, AFP, Mg, PT INR, CA 19-9 and CEA. -US with doppler. -Hold off on anticoagulation since he has chronic splenic thrombosis related to previous episodes of pancreatitis. -I have instructed patient that he needs to chew foods especially meats and breads well and eat slowly. -FU televisit in 4 weeks.   HPI:    Todd Coffey is a 58 y.o. male  Complains of dysphagia-mostly solids, mid chest, occasionally has to wash with liquids, has associated regurgitation and heartburn despite omeprazole.  He has been on omeprazole for years.  Occasional  nausea/vomiting but not significant.  No melena or hematochezia.  No significant diarrhea or constipation. No unintentional weight loss.  Denies having any abdominal pain.  Feels much better.  No nonsteroidals.  Denies having any jaundice dark urine, pale stools.   Has quit drinking all alcohol, quit all drugs, quit smoking since May 2019.  He was very sick in May with upper GI bleeding/alcohol withdrawal.  Todd Coffey (sister) made a move to [place].  He feels much better now.    Review of past medical records: -H/O UGI bleeding d/t erosive esophagitis s/p clipping and endoscopy x3 with severe coagulopathy/alcohol abuse (INR>9)and hypovolemic/septic shock s/p 10U PRBC. No Varices. (Aug 2019   [place] neg except for mod sigmoid diverticulosis (report in care everywhere).  No need for 10 years.  EGD 11/2016-esophageal stricture status post dilatation to 14 mm balloon, 2 cm hiatal hernia, esophagitis, Candida esophagitis, gastritis (report in care everywhere).  No varices   EGD N/A 06/18/2016  Procedure: ESOPHAGOGASTRODUODENOSCOPY FLEXIBLE TRANSORAL DIAGNOSTIC; Surgeon: Quentin CornwallWilliams, Alex O, MD   EGD N/A 09/25/2016  Procedure: ESOPHAGOGASTRODUODENOSCOPY FLEXIBLE TRANSORAL DIAGNOSTIC; Surgeon: Hurman Hornhiwan, Syed I, MD   HERNIA REPAIR   PANCREATIC CYST DRAINAGE 1995  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   Alcoholic liver disease (HCC) 07/21/2018   Altered mental status 05/06/2013  Anemia due to acute blood loss 07/2018   Anxiety attack    Ascites due to alcoholic hepatitis 07/21/2018    Chest pain 11/09/2013   "have had chest pains several times" (09/17/2018)   Chronic alcoholism (HCC)    Hattie Perch 09/17/2018   Coagulopathy (HCC) unknown   Family history of von Willebrand disease    "sister, Todd Coffey" (09/17/2018)   Gastrointestinal hemorrhage with melena unknown   GERD (gastroesophageal reflux disease)    Headache    "weekly" (09/17/2018)   History of blood transfusion 06/2018   "10 units in 8 h; probably had some before" (09/17/2018)   History of cirrhosis of liver    /notes 09/17/2018   History of hiatal hernia    Hypertension    Hypokalemia 09/19/2016   Hypomagnesemia 09/19/2016   Irregular heart rate 07/2018   Pancreatic mass    pancreatic masses/notes 09/17/2018   Portal vein thrombosis 09/19/2016   Protein malnutrition (HCC) 07/02/2018   chronic   Scrotal edema 07/21/2018   Seizure (HCC)    "since ~ 2002; several; controlled w/RX" (09/17/2018)   Subconjunctival hemorrhage of right eye 06/14/2016   Tremor 09/26/2014    Past Surgical History:  Procedure Laterality Date   COLONOSCOPY     ESOPHAGEAL VARICE LIGATION  06/2018   ESOPHAGOGASTRODUODENOSCOPY     HEMORRHOID SURGERY     INGUINAL HERNIA REPAIR Right X 2   PANCREAS SURGERY     "cyst"    Family History  Problem Relation Age of Onset   Stroke Mother    Congestive Heart Failure Mother    Other Father        unsure of history    Alcohol abuse Maternal Uncle     Social History   Tobacco Use   Smoking status: Former Smoker    Packs/day: 0.33    Years: 45.00    Pack years: 14.85    Types: Cigarettes    Last attempt to quit: 2019    Years since quitting: 1.3   Smokeless tobacco: Former Neurosurgeon  Substance Use Topics   Alcohol use: Not Currently    Comment:  "nothing since 06/30/2018"   Drug use: Not Currently    Types: Marijuana    Comment: "I've done everything but heroin; last drug use was marijuana in ~ 03/2018"     Current Outpatient Medications    Medication Sig Dispense Refill   acetaminophen (TYLENOL) 500 MG tablet Take 1 tablet (500 mg total) by mouth every 6 (six) hours as needed for mild pain or moderate pain. 30 tablet    Cholecalciferol (VITAMIN D3 PO) Take 1,000 Units by mouth daily.     folic acid (FOLVITE) 1 MG tablet Take 1 tablet (1 mg total) by mouth daily. 90 tablet 1   furosemide (LASIX) 20 MG tablet Take 1 tablet (20 mg total) by mouth daily. 90 tablet 1   levETIRAcetam (KEPPRA) 500 MG tablet Take 1 tablet (500 mg total) by mouth 2 (two) times daily. 60 tablet 2   metoprolol tartrate (LOPRESSOR) 25 MG tablet Take 1 tablet (25 mg total) by mouth 2 (two) times daily. 180 tablet 1   omeprazole (PRILOSEC) 40 MG capsule Take 1 capsule (40 mg total) by mouth 2 (two) times daily. 180 capsule 1   spironolactone (ALDACTONE) 25 MG tablet Take 1 tablet (25 mg total) by mouth daily. 90 tablet 1   therapeutic multivitamin-minerals (THERAGRAN-M) tablet Take 1 tablet by mouth daily. 30 tablet 6   thiamine 100 MG tablet  Take 1 tablet (100 mg total) by mouth daily. 30 tablet 6   No current facility-administered medications for this visit.     Allergies  Allergen Reactions   Morphine And Related Other (See Comments)    Unknown per pt, states he was out of it.    Peach [Prunus Persica] Hives    Review of Systems:  Constitutional: Denies fever, chills, diaphoresis, appetite change and fatigue.  HEENT: Denies photophobia, eye pain, redness, hearing loss, ear pain, congestion, sore throat, rhinorrhea, sneezing, mouth sores, neck pain, neck stiffness and tinnitus.   Respiratory: Denies SOB, DOE, cough, chest tightness,  and wheezing.   Cardiovascular: Denies chest pain, palpitations and leg swelling.  Genitourinary: Denies dysuria, urgency, frequency, hematuria, flank pain and difficulty urinating.  Musculoskeletal: Denies myalgias, back pain, joint swelling, arthralgias and gait problem.  Skin: No rash.  Neurological:  Denies dizziness, had seizures, no syncope, weakness, light-headedness, numbness and headaches.  Hematological: Denies adenopathy. Easy bruising, personal or family bleeding history  Psychiatric/Behavioral: Has anxiety or depression     Physical Exam:    There were no vitals taken for this visit. There were no vitals filed for this visit.  Not examined since it was a tele-visit Data Reviewed: I have personally reviewed following labs and imaging studies  CBC: CBC Latest Ref Rng & Units 10/06/2018 09/20/2018 09/19/2018  WBC 3.4 - 10.8 x10E3/uL 9.7 4.8 5.0  Hemoglobin 13.0 - 17.7 g/dL 12.9(L) 10.9(L) 10.9(L)  Hematocrit 37.5 - 51.0 % 38.0 33.0(L) 32.6(L)  Platelets 150 - 450 x10E3/uL 265 126(L) 128(L)    CMP: CMP Latest Ref Rng & Units 10/06/2018 09/20/2018 09/19/2018  Glucose 65 - 99 mg/dL 66(Z) 98 993(T)  BUN 6 - 24 mg/dL 12 <7(S) <1(X)  Creatinine 0.76 - 1.27 mg/dL 7.93(J) 0.30 0.92  Sodium 134 - 144 mmol/L 137 136 137  Potassium 3.5 - 5.2 mmol/L 4.2 4.6 4.2  Chloride 96 - 106 mmol/L 97 105 106  CO2 20 - 29 mmol/L 22 26 25   Calcium 8.7 - 10.2 mg/dL 9.3 3.3(A) 8.3(L)  Total Protein 6.0 - 8.5 g/dL 7.2 6.0(L) 5.6(L)  Total Bilirubin 0.0 - 1.2 mg/dL 0.3 0.6 0.8  Alkaline Phos 39 - 117 IU/L 116 98 97  AST 0 - 40 IU/L 33 31 26  ALT 0 - 44 IU/L 20 18 17    This service was provided via telemedicine.  The patient was located at home.  The provider was located in office.  The patient did consent to this tele- visit and is aware of possible charges through their insurance for this visit.  The patient was referred by ED.  The other persons participating in this telemedicine service were Sister Todd Coffey and their role was caregiver.  Time spent on call, review of records and coordination of care: 45 min    Edman Circle, MD 02/27/2019, 8:39 AM  Cc: Jim Like, NP

## 2019-03-19 NOTE — Addendum Note (Signed)
Addended by: Sharlee Blew on: 03/19/2019 03:31 PM   Modules accepted: Orders

## 2019-03-23 ENCOUNTER — Other Ambulatory Visit: Payer: Self-pay | Admitting: Gastroenterology

## 2019-03-23 ENCOUNTER — Telehealth: Payer: Self-pay | Admitting: Gastroenterology

## 2019-03-23 DIAGNOSIS — Z8719 Personal history of other diseases of the digestive system: Secondary | ICD-10-CM

## 2019-03-23 DIAGNOSIS — K852 Alcohol induced acute pancreatitis without necrosis or infection: Secondary | ICD-10-CM

## 2019-03-23 DIAGNOSIS — K766 Portal hypertension: Secondary | ICD-10-CM

## 2019-03-23 DIAGNOSIS — K7030 Alcoholic cirrhosis of liver without ascites: Secondary | ICD-10-CM

## 2019-03-23 NOTE — Addendum Note (Signed)
Addended by: Sharlee Blew on: 03/23/2019 11:44 AM   Modules accepted: Orders

## 2019-03-23 NOTE — Addendum Note (Signed)
Addended by: Sharlee Blew on: 03/23/2019 01:11 PM   Modules accepted: Orders

## 2019-03-23 NOTE — Telephone Encounter (Signed)
[facility] called to informed that they have received orders for pt -- they need to add RUQ [place] to the order.

## 2019-03-23 NOTE — Telephone Encounter (Signed)
RUQ [place] order has been placed.

## 2019-03-25 ENCOUNTER — Other Ambulatory Visit (HOSPITAL_BASED_OUTPATIENT_CLINIC_OR_DEPARTMENT_OTHER): Payer: Medicaid Other

## 2019-03-26 ENCOUNTER — Other Ambulatory Visit: Payer: Self-pay | Admitting: *Deleted

## 2019-03-26 ENCOUNTER — Telehealth: Payer: Self-pay | Admitting: Gastroenterology

## 2019-03-26 DIAGNOSIS — K852 Alcohol induced acute pancreatitis without necrosis or infection: Secondary | ICD-10-CM

## 2019-03-26 NOTE — Telephone Encounter (Signed)
Spoke with the sister who wanted to reschedule the patients appointment until after doppler, abd [place] and labs were completed. The new appt is scheduled on April 09, 2019 at 1:30 pm via Zoom.   Lab orders in Epic per Dr. Urban Gibson noted written on 02/27/19  The sister notified this RN that the patient would be in to the [facility] lab for completion. Nothing further at the end of the phone call.

## 2019-03-26 NOTE — Telephone Encounter (Signed)
Pt sister called with some questions about the pt upcoming appt and would like to speak with the nurse

## 2019-03-31 ENCOUNTER — Telehealth: Payer: Medicaid Other | Admitting: Gastroenterology

## 2019-04-01 ENCOUNTER — Other Ambulatory Visit (INDEPENDENT_AMBULATORY_CARE_PROVIDER_SITE_OTHER): Payer: Medicaid Other

## 2019-04-01 DIAGNOSIS — K852 Alcohol induced acute pancreatitis without necrosis or infection: Secondary | ICD-10-CM

## 2019-04-01 LAB — CBC WITH DIFFERENTIAL/PLATELET
Basophils Absolute: 0 10*3/uL (ref 0.0–0.1)
Basophils Relative: 0.6 % (ref 0.0–3.0)
Eosinophils Absolute: 0.2 10*3/uL (ref 0.0–0.7)
Eosinophils Relative: 3.1 % (ref 0.0–5.0)
HCT: 37.9 % — ABNORMAL LOW (ref 39.0–52.0)
Hemoglobin: 13 g/dL (ref 13.0–17.0)
Lymphocytes Relative: 39.4 % (ref 12.0–46.0)
Lymphs Abs: 2.7 10*3/uL (ref 0.7–4.0)
MCHC: 34.3 g/dL (ref 30.0–36.0)
MCV: 84.7 fl (ref 78.0–100.0)
Monocytes Absolute: 0.7 10*3/uL (ref 0.1–1.0)
Monocytes Relative: 10.5 % (ref 3.0–12.0)
Neutro Abs: 3.2 10*3/uL (ref 1.4–7.7)
Neutrophils Relative %: 46.4 % (ref 43.0–77.0)
Platelets: 203 10*3/uL (ref 150.0–400.0)
RBC: 4.47 Mil/uL (ref 4.22–5.81)
RDW: 14.4 % (ref 11.5–15.5)
WBC: 6.9 10*3/uL (ref 4.0–10.5)

## 2019-04-01 LAB — PROTIME-INR
INR: 1 ratio (ref 0.8–1.0)
Prothrombin Time: 11.9 s (ref 9.6–13.1)

## 2019-04-01 LAB — AMMONIA: Ammonia: 47 umol/L — ABNORMAL HIGH (ref 11–35)

## 2019-04-01 LAB — COMPREHENSIVE METABOLIC PANEL
ALT: 31 U/L (ref 0–53)
AST: 29 U/L (ref 0–37)
Albumin: 4.3 g/dL (ref 3.5–5.2)
Alkaline Phosphatase: 97 U/L (ref 39–117)
BUN: 13 mg/dL (ref 6–23)
CO2: 26 mEq/L (ref 19–32)
Calcium: 9.2 mg/dL (ref 8.4–10.5)
Chloride: 102 mEq/L (ref 96–112)
Creatinine, Ser: 0.85 mg/dL (ref 0.40–1.50)
GFR: 92.7 mL/min (ref >60.00)
Glucose, Bld: 82 mg/dL (ref 70–99)
Potassium: 3.9 mEq/L (ref 3.5–5.1)
Sodium: 137 mEq/L (ref 135–145)
Total Bilirubin: 0.4 mg/dL (ref 0.2–1.2)
Total Protein: 7.6 g/dL (ref 6.0–8.3)

## 2019-04-01 LAB — LIPASE: Lipase: 16 U/L (ref 11.0–59.0)

## 2019-04-01 LAB — MAGNESIUM: Magnesium: 1.8 mg/dL (ref 1.5–2.5)

## 2019-04-02 LAB — CA 19-9 (SERIAL)

## 2019-04-03 ENCOUNTER — Other Ambulatory Visit: Payer: Self-pay

## 2019-04-03 ENCOUNTER — Ambulatory Visit
Admission: RE | Admit: 2019-04-03 | Discharge: 2019-04-03 | Disposition: A | Discharge status: Home / Self Care | Payer: Medicaid Other | Source: Ambulatory Visit | Attending: Gastroenterology | Admitting: Gastroenterology

## 2019-04-03 DIAGNOSIS — K852 Alcohol induced acute pancreatitis without necrosis or infection: Secondary | ICD-10-CM

## 2019-04-03 DIAGNOSIS — K766 Portal hypertension: Secondary | ICD-10-CM

## 2019-04-03 DIAGNOSIS — K7030 Alcoholic cirrhosis of liver without ascites: Secondary | ICD-10-CM

## 2019-04-03 DIAGNOSIS — Z8719 Personal history of other diseases of the digestive system: Secondary | ICD-10-CM

## 2019-04-03 LAB — CA 19-9 (SERIAL): CA 19-9: 9 U/mL (ref 0–35)

## 2019-04-03 LAB — CEA: CEA: 1.4 ng/mL

## 2019-04-03 LAB — AFP TUMOR MARKER: AFP-Tumor Marker: 2.2 ng/mL (ref <6.1)

## 2019-04-06 HISTORY — PX: ESOPHAGOGASTRODUODENOSCOPY: SHX1529

## 2019-04-09 ENCOUNTER — Other Ambulatory Visit: Payer: Self-pay

## 2019-04-09 ENCOUNTER — Telehealth (INDEPENDENT_AMBULATORY_CARE_PROVIDER_SITE_OTHER): Payer: Medicaid Other | Admitting: Gastroenterology

## 2019-04-09 ENCOUNTER — Encounter: Payer: Self-pay | Admitting: Neurology

## 2019-04-09 ENCOUNTER — Ambulatory Visit (INDEPENDENT_AMBULATORY_CARE_PROVIDER_SITE_OTHER): Payer: Medicaid Other | Admitting: Neurology

## 2019-04-09 ENCOUNTER — Encounter: Payer: Self-pay | Admitting: Gastroenterology

## 2019-04-09 VITALS — Ht 66.0 in | Wt 141.1 lb

## 2019-04-09 DIAGNOSIS — Z8719 Personal history of other diseases of the digestive system: Secondary | ICD-10-CM

## 2019-04-09 DIAGNOSIS — R202 Paresthesia of skin: Secondary | ICD-10-CM | POA: Diagnosis not present

## 2019-04-09 DIAGNOSIS — G40909 Epilepsy, unspecified, not intractable, without status epilepticus: Secondary | ICD-10-CM

## 2019-04-09 DIAGNOSIS — K7030 Alcoholic cirrhosis of liver without ascites: Secondary | ICD-10-CM | POA: Diagnosis not present

## 2019-04-09 DIAGNOSIS — K219 Gastro-esophageal reflux disease without esophagitis: Secondary | ICD-10-CM

## 2019-04-09 DIAGNOSIS — R1310 Dysphagia, unspecified: Secondary | ICD-10-CM

## 2019-04-09 DIAGNOSIS — R569 Unspecified convulsions: Secondary | ICD-10-CM | POA: Diagnosis not present

## 2019-04-09 DIAGNOSIS — R269 Unspecified abnormalities of gait and mobility: Secondary | ICD-10-CM

## 2019-04-09 MED ORDER — LACTULOSE 10 GM/15ML PO SOLN: 10.0000 g | Freq: Every day | ORAL | 2 refills | Status: DC

## 2019-04-09 MED ORDER — LEVETIRACETAM 500 MG PO TABS: 500.0000 mg | ORAL_TABLET | Freq: Two times a day (BID) | ORAL | 4 refills | Status: DC

## 2019-04-09 MED ORDER — GABAPENTIN 300 MG PO CAPS: 300.0000 mg | ORAL_CAPSULE | Freq: Two times a day (BID) | ORAL | 11 refills | Status: DC

## 2019-04-09 NOTE — Progress Notes (Signed)
Addendum to today's note:  The labs were already done.  Hence will not do it again.

## 2019-04-09 NOTE — Patient Instructions (Signed)
If you are age 58 or older, your body mass index should be between 23-30. Your Body mass index is 22.78 kg/m. If this is out of the aforementioned range listed, please consider follow up with your Primary Care Provider.  If you are age 40 or younger, your body mass index should be between 19-25. Your Body mass index is 22.78 kg/m. If this is out of the aformentioned range listed, please consider follow up with your Primary Care Provider.   To help prevent the possible spread of infection to our patients, communities, and staff; we will be implementing the following measures:  As of now we are not allowing any visitors/family members to accompany you to any upcoming appointments with [facility]. If you have any concerns about this please contact our office to discuss prior to the appointment.   We have sent the following medications to your pharmacy for you to pick up at your convenience: Lactulose 60ml by mouth once daily.  Hold off on anti-coagulation medication for now.  Chew your food, especially meat slowly and thoroughly before swallowing.  You have been scheduled for a Barium Esophogram at [facility] (1st floor of the hospital) on 04/13/2019 at 10:00am. Please arrive 15 minutes prior to your appointment for registration. Make certain not to have anything to eat or drink 3 hours prior to your test. If you need to reschedule for any reason, please contact radiology at [phone] to do so. __________________________________________________________________ A barium swallow is an examination that concentrates on views of the esophagus. This tends to be a double contrast exam (barium and two liquids which, when combined, create a gas to distend the wall of the oesophagus) or single contrast (non-ionic iodine based). The study is usually tailored to your symptoms so a good history is essential. Attention is paid during the study to the form, structure and configuration of the  esophagus, looking for functional disorders (such as aspiration, dysphagia, achalasia, motility and reflux) EXAMINATION You may be asked to change into a gown, depending on the type of swallow being performed. A radiologist and radiographer will perform the procedure. The radiologist will advise you of the type of contrast selected for your procedure and direct you during the exam. You will be asked to stand, sit or lie in several different positions and to hold a small amount of fluid in your mouth before being asked to swallow while the imaging is performed .In some instances you may be asked to swallow barium coated marshmallows to assess the motility of a solid food bolus. The exam can be recorded as a digital or video fluoroscopy procedure. POST PROCEDURE It will take 1-2 days for the barium to pass through your system. To facilitate this, it is important, unless otherwise directed, to increase your fluids for the next 24-48hrs and to resume your normal diet.  This test typically takes about 30 minutes to perform. __________________________________________________________________________________  Thank you,  Dr. Lynann Bologna

## 2019-04-09 NOTE — Progress Notes (Signed)
Chief Complaint: FU  Referring Provider:  Earlyne Iba, NP      ASSESSMENT AND PLAN;   #1. Chronic pancreatitis (H/O ETOH abuse, quit ETOH Jun 30, 2018). MRI/MRCP 09/2018 -acute on chronic pancreatitis.  Possibly pancreas divisum.  Diffusely mildly dilated pancreatic duct. No PD stones/ masses. 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  No exocrine or endocrine pancreatic insufficiency clinically. H/O pancreatic pseudocyst s/p endoscopic drainage in past. Nl CA19-9, CEA 03/2019  #2. ETOH liver cirrhosis with H/O ascites.  Mild subclinical hepatic encephalopathy with sl elevated ammonia. [place] 03/2028- mild cirrhosis, no ascites or splenomegaly.  Normal flow on Doppler. Nl AFP 03/2019. Alb 4.3 (03/2019)  #3. H/O UGI bleeding d/t erosive esophagitis s/p clipping and endoscopy x3 with severe coagulopathy/alcohol abuse (INR>9)and hypovolemic/septic shock s/p 10U PRBC. No Varices. (Aug 2019)  #4. GERD with H/O HH, esophageal stricture s/p dil 11/2016  #5.  H/O seizures (? ETOH withdrawal)  #6.  H/O multi-substance abuse (cocaine, marijuana, tobacco).  Quit since he moved to [place].  Under care of his sister [name].  #7.  Comorbid conditions- SZ, anxiety/depression, family history of von Willebrand disease   Plan: -Continue protonix 66m po bid. -Ba swallow with Ba tab ASAP. -[facility] -Salt and fluid restricted diet.  -Continue low dose lasix and spironolactone. -Monitor weight. -Check CBC, CMP, ammonia, lipase, AFP, Mg, PT INR, CA 19-9 and CEA. -Lactulose (15g/12m 1tbs po qd #1 large bottle, 6 refills. -Hold off on anticoagulation since he has chronic splenic thrombosis related to previous episodes of pancreatitis. -I have instructed patient that he needs to chew foods especially meats and breads well and eat slowly. -FU televisit in 12 weeks.   HPI:    Todd Coffey a [age].o. male   For follow-up  Doing very well I talked to his sister most of the time.  He was in the background. Has been compliant with medications, salt restriction. No alcohol.  Denies having any diarrhea.  Does have normal bowel movements 3-4 times a day.  According to his sister, he does get confused at times.  Not sleeping excessively.  He is also hard of hearing.  He has been on lactulose in the past.  His ammonia level was 47 previously.  Still with some dysphagia-mostly solids, mid chest, occasionally However, regurgitation and heartburn has resolved on twice daily Protonix.  No nausea/vomiting.  No melena or hematochezia.  No significant diarrhea or constipation. No unintentional weight loss.  Denies having any abdominal pain.  Feels much better.  No nonsteroidals.  Denies having any jaundice dark urine, pale stools.   Has quit drinking all alcohol, quit all drugs, quit smoking since May 2019.  He was very sick in May with upper GI bleeding/alcohol withdrawal.  [name] better [place]   Review of past medical records: -H/O UGI bleeding d/t erosive esophagitis s/p clipping and endoscopy x3 with severe coagulopathy/alcohol abuse (INR>9)and hypovolemic/septic shock s/p 10U PRBC. No Varices. (Aug 2019   [place] except for mod sigmoid diverticulosis (report in care everywhere).  No need for 10 years.  EGD 11/2016-esophageal stricture status post dilatation to 14 mm balloon, 2 cm hiatal hernia, esophagitis, Candida esophagitis, gastritis (report in care everywhere).  No varices  . EGD N/A 06/18/2016  Procedure: ESOPHAGOGASTRODUODENOSCOPY FLEXIBLE TRANSORAL DIAGNOSTIC; Surgeon: WiQuintella ReichertMD  .  EGD N/A 09/25/2016  Procedure: ESOPHAGOGASTRODUODENOSCOPY FLEXIBLE TRANSORAL DIAGNOSTIC; Surgeon: Revonda Humphrey, MD  . HERNIA REPAIR  . PANCREATIC CYST DRAINAGE 1995  Past Medical History:  Diagnosis Date  . Acute on chronic  pancreatitis (HCC)    hx of pancreatitis; nothing recently til today /notes 09/17/2018 (09/17/2018)  . Acute stomach ulcer 07/2018  . Alcohol dependence with withdrawal ([place]) 31/54/0086   w/complications  . Alcohol withdrawal ([place]) 09/19/2016  . Alcohol-induced chronic pancreatitis ([place]) 09/26/2014; 09/19/2016  . Alcoholic gastritis 76/19/5093  . Alcoholic liver disease ([place]) 07/21/2018  . Altered mental status 05/06/2013  . Anemia due to acute blood loss 07/2018  . Anxiety attack   . Ascites due to alcoholic hepatitis 26/71/2458  . Chest pain 11/09/2013   "have had chest pains several times" (09/17/2018)  . Chronic alcoholism ([place])    Archie Endo 09/17/2018  . Coagulopathy ([place]) unknown  . Family history of von Willebrand disease    "sister, [name]" (09/17/2018)  . Gastrointestinal hemorrhage with melena unknown  . GERD (gastroesophageal reflux disease)   . Headache    "weekly" (09/17/2018)  . History of blood transfusion 06/2018   "10 units in 8 h; probably had some before" (09/17/2018)  . History of cirrhosis of liver    /notes 09/17/2018  . History of hiatal hernia   . Hypertension   . Hypokalemia 09/19/2016  . Hypomagnesemia 09/19/2016  . Irregular heart rate 07/2018  . Pancreatic mass    pancreatic masses/notes 09/17/2018  . 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 Protein malnutrition ([place]) 07/02/2018   chronic  . Scrotal edema 07/21/2018  . Seizure ([place])    "since ~ 2002; several; controlled w/RX" (09/17/2018)  . Subconjunctival hemorrhage of right eye 06/14/2016  . Tremor 09/26/2014    Past Surgical History:  Procedure Laterality Date  . COLONOSCOPY    . ESOPHAGEAL VARICE LIGATION  06/2018  . ESOPHAGOGASTRODUODENOSCOPY    . HEMORRHOID SURGERY    . INGUINAL HERNIA REPAIR Right X 2  . PANCREAS SURGERY     "cyst"    Family History  Problem Relation Age of Onset  . Stroke Mother   . Congestive Heart Failure Mother   . Other Father        unsure of history   .  Alcohol abuse Maternal Uncle     Social History   Tobacco Use  . Smoking status: Former Smoker    Packs/day: 0.33    Years: 45.00    Pack years: 14.85    Types: Cigarettes    Last attempt to quit: 2019    Years since quitting: 1.4  . Smokeless tobacco: Former Network engineer Use Topics  . Alcohol use: Not Currently    Comment:  "nothing since 06/30/2018"  . Drug use: Not Currently    Types: Marijuana    Comment: "I've done everything but heroin; last drug use was marijuana in ~ 03/2018"     Current Outpatient Medications  Medication Sig Dispense Refill  . acetaminophen (TYLENOL) 500 MG tablet Take 1 tablet (500 mg total) by mouth every 6 (six) hours as needed for mild pain or moderate pain. 30 tablet   . Cholecalciferol (VITAMIN D3 PO) Take 1,000 Units by mouth once a week.     . folic acid (FOLVITE) 1 MG tablet Take 1 tablet (1 mg total) by mouth daily. 90 tablet 1  . furosemide (LASIX) 20 MG tablet Take 1 tablet (20 mg total) by mouth daily. 90 tablet 1  . gabapentin (  NEURONTIN) 300 MG capsule Take 1 capsule (300 mg total) by mouth 2 (two) times daily. 60 capsule 11  . levETIRAcetam (KEPPRA) 500 MG tablet Take 1 tablet (500 mg total) by mouth 2 (two) times daily. 180 tablet 4  . metoprolol tartrate (LOPRESSOR) 25 MG tablet Take 1 tablet (25 mg total) by mouth 2 (two) times daily. 180 tablet 1  . pantoprazole (PROTONIX) 40 MG tablet Take 1 tablet (40 mg total) by mouth 2 (two) times daily before a meal. 60 tablet 3  . spironolactone (ALDACTONE) 25 MG tablet Take 1 tablet (25 mg total) by mouth daily. 90 tablet 1  . therapeutic multivitamin-minerals (THERAGRAN-M) tablet Take 1 tablet by mouth daily. 30 tablet 6  . thiamine 100 MG tablet Take 1 tablet (100 mg total) by mouth daily. 30 tablet 6   No current facility-administered medications for this visit.     Allergies  Allergen Reactions  . Morphine And Related Other (See Comments)    Unknown per pt, states he was out of it.    Marland Kitchen Peach [Prunus Persica] Hives    Review of Systems:       Physical Exam:    Ht _0  (1.676 m)   Wt 141 lb 2 oz (64 kg)   BMI 22.78 kg/m  Filed Weights   04/09/19 1305  Weight: 141 lb 2 oz (64 kg)    Not examined since it was a tele-visit Data Reviewed: I have personally reviewed following labs and imaging studies  CBC: CBC Latest Ref Rng & Units 04/01/2019 10/06/2018 09/20/2018  WBC 4.0 - 10.5 K/uL 6.9 9.7 4.8  Hemoglobin 13.0 - 17.0 g/dL 13.0 12.9(L) 10.9(L)  Hematocrit 39.0 - 52.0 % 37.9(L) 38.0 33.0(L)  Platelets 150.0 - 400.0 K/uL 203.0 265 126(L)    CMP: CMP Latest Ref Rng & Units 04/01/2019 10/06/2018 09/20/2018  Glucose 70 - 99 mg/dL 82 61(L) 98  BUN 6 - 23 mg/dL 13 12 <5(L)  Creatinine 0.40 - 1.50 mg/dL 0.85 0.58(L) 0.61  Sodium 135 - 145 mEq/L 137 137 136  Potassium 3.5 - 5.1 mEq/L 3.9 4.2 4.6  Chloride 96 - 112 mEq/L 102 97 105  CO2 19 - 32 mEq/L _1 Calcium 8.4 - 10.5 mg/dL 9.2 9.3 8.4(L)  Total Protein 6.0 - 8.3 g/dL 7.6 7.2 6.0(L)  Total Bilirubin 0.2 - 1.2 mg/dL 0.4 0.3 0.6  Alkaline Phos 39 - 117 U/L 97 116 98  AST 0 - 37 U/L 29 33 31  ALT 0 - 53 U/L _2 Hepatic Function Latest Ref Rng & Units 04/01/2019 10/06/2018 09/20/2018  Total Protein 6.0 - 8.3 g/dL 7.6 7.2 6.0(L)  Albumin 3.5 - 5.2 g/dL 4.3 3.7 2.6(L)  AST 0 - 37 U/L 29 33 31  ALT 0 - 53 U/L _3 Alk Phosphatase 39 - 117 U/L 97 116 98  Total Bilirubin 0.2 - 1.2 mg/dL 0.4 0.3 0.6     This service was provided via telemedicine.  The patient was located at home.  The provider was located in office.  The patient did consent to this tele- visit and is aware of possible charges through their insurance for this visit.  The patient was referred by ED.  The other persons participating in this telemedicine service were Sister [name] and their role was caregiver.  Time spent on call, review of records and coordination of care: 25 min    Carmell Austria, MD 04/09/2019, 1:44 PM  Cc:  Earlyne Iba, NP

## 2019-04-09 NOTE — Addendum Note (Signed)
Addended by: Oliver Barre D on: 04/09/2019 03:11 PM   Modules accepted: Orders

## 2019-04-09 NOTE — Progress Notes (Signed)
PATIENT: Todd Coffey DOB: 1961/01/07  No chief complaint on file.    HISTORICAL  Todd Coffey is a 58 year old male, seen in request by his primary care nurse practitioner Wenda Low for evaluation of seizure, he is accompanied by [name], who is also his power of attorney.  Initial evaluation was on January 06, 2019.   I have reviewed and summarized the referring note from the referring physician.  I was able to review his hospital discharge on September 20, 2018, he had a history of long-time alcoholism, alcoholic liver cirrhosis with varices, pancreatitis, pseudocyst, moved from [place] to [place] to live with his sister, was admitted to hospital in November 2019 for recurrent pancreatitis, abdominal pain, MRI of abdomen on September 18, 2018 showed change of acute on chronic pancreatitis, distended gallbladder, chronic splenic vein thrombosis with extensive perisplenic and epigastric collaterals, numerous small mesenteric and retroperitoneal lymph node,  He had a long history of heavy alcoholism, he reported started drinking since 58 years old, about 20 years ago, when he stopped heavy drinking, he would have recurrent seizure, he was initially treated with Dilantin, over past 10 years, he was in and out of incarceration, sometimes have recurrent seizure often related withdrawal from alcohol, he also have a history of polysubstance abuse, cocaine, marijuana, last use was in 2017.   He was started on Keppra since 2018 after recurrent seizure, that was the last seizure he has, he stopped drinking since August 2019,  He gradually developed worsening gait abnormality since 2018, previously contributed to his substance abuse, getting worse since August 2019, unsteady gait, tendency to fall,  He also complains of memory loss, difficulty focusing, bilateral feet paresthesia  Laboratory evaluations in January 2020, Keppra level was 2.2, normal magnesium, TSH, ammonia  level, CMP showed no significant abnormality.CBC showed hemoglobin of 12.9,  Virtual Visit via Video  I connected with Todd Coffey on 04/09/19 at  by Video and verified that I am speaking with the correct person using two identifiers.   I discussed the limitations, risks, security and privacy concerns of performing an evaluation and management service by video and the availability of in person appointments. I also discussed with the patient that there may be a patient responsible charge related to this service. The patient expressed understanding and agreed to proceed.   History of Present Illness: I saw patient with his sister Todd Coffey, he is overall doing well, has no recurrent seizure, taking keppra 500mg  bid.   EEG was normal on January 15 2019.  I personally reviewed MRI of cervical spine, multilevel degenerative changes, there was no acute abnormalities. MRI of the brain, mild to moderate generalized atrophy, mild corpus callosum and cerebellum vermis atrophy, periventricular small vessel disease   Observations/Objective: I have reviewed problem lists, medications, allergies. Awake, alert, oriented to history taking and casual conversation, mildly wide-based, cautious gait  Laboratory evaluations in March showed low vitamin D 25, normal copper, vitamin B12, RPR, TSH, Keppra level was 2.2,  Assessment and Plan: History of heavy alcohol use, last use was in August 2019, also have a history of polysubstance abuse, cocaine, marijuana, History of seizure, last seizure was in 2018, was often related with alcohol withdrawal Memory loss Progressive worsening gait abnormality, Bilateral lower extremity paresthesia, length dependent sensory changes,  He has gait ataxia, cerebellum signs, evidence of peripheral neuropathy,  His gait abnormality 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 The patient was provided an opportunity to ask questions and all were answered. The patient agreed with the plan and demonstrated an understanding of the instructions.   The patient was advised to call back or seek an in-person evaluation if the symptoms worsen or if the condition fails to improve as anticipated.  I provided 30 minutes of non-face-to-face time during this encounter.   Levert FeinsteinYijun Abbegayle Denault, MD CC: Wenda LowPotts, Anna, NP

## 2019-04-13 ENCOUNTER — Ambulatory Visit (HOSPITAL_COMMUNITY)
Admission: RE | Admit: 2019-04-13 | Discharge: 2019-04-13 | Disposition: A | Discharge status: Home / Self Care | Payer: Medicaid Other | Source: Ambulatory Visit | Attending: Gastroenterology | Admitting: Gastroenterology

## 2019-04-13 ENCOUNTER — Other Ambulatory Visit: Payer: Self-pay

## 2019-04-13 DIAGNOSIS — K219 Gastro-esophageal reflux disease without esophagitis: Secondary | ICD-10-CM | POA: Diagnosis present

## 2019-04-13 DIAGNOSIS — R1310 Dysphagia, unspecified: Secondary | ICD-10-CM | POA: Diagnosis present

## 2019-04-13 DIAGNOSIS — Z8719 Personal history of other diseases of the digestive system: Secondary | ICD-10-CM | POA: Diagnosis present

## 2019-04-13 DIAGNOSIS — K7030 Alcoholic cirrhosis of liver without ascites: Secondary | ICD-10-CM | POA: Insufficient documentation

## 2019-04-16 ENCOUNTER — Encounter: Payer: Self-pay | Admitting: Gastroenterology

## 2019-04-16 ENCOUNTER — Ambulatory Visit (AMBULATORY_SURGERY_CENTER): Payer: Medicaid Other | Admitting: Gastroenterology

## 2019-04-16 ENCOUNTER — Other Ambulatory Visit: Payer: Self-pay

## 2019-04-16 VITALS — BP 115/72 | HR 55 | Temp 98.6°F | Resp 16 | Ht 66.0 in | Wt 141.0 lb

## 2019-04-16 DIAGNOSIS — K222 Esophageal obstruction: Secondary | ICD-10-CM

## 2019-04-16 DIAGNOSIS — R1310 Dysphagia, unspecified: Secondary | ICD-10-CM

## 2019-04-16 DIAGNOSIS — K2970 Gastritis, unspecified, without bleeding: Secondary | ICD-10-CM

## 2019-04-16 DIAGNOSIS — K449 Diaphragmatic hernia without obstruction or gangrene: Secondary | ICD-10-CM

## 2019-04-16 DIAGNOSIS — K7030 Alcoholic cirrhosis of liver without ascites: Secondary | ICD-10-CM

## 2019-04-16 MED ORDER — SODIUM CHLORIDE 0.9 % IV SOLN: 500.0000 mL | Freq: Once | INTRAVENOUS | Status: DC

## 2019-04-16 NOTE — Patient Instructions (Signed)
Handouts given for Hiatal Hernia, Gastritis, Stricture and Post-Dilation Diet.  AVOID NSAIDS, ASPIRIN( AND ASPIRIN CONTAINING MEDICATIONS), IBUPROFEN, NAPROXEN.  Return in 4 weeks FOR ANOTHER EGD.  Continue Protonix 40 mg twice daily.  YOU HAD AN ENDOSCOPIC PROCEDURE TODAY AT [facility]:   Refer to the procedure report that was given to you for any specific questions about what was found during the examination.  If the procedure report does not answer your questions, please call your gastroenterologist to clarify.  If you requested that your care partner not be given the details of your procedure findings, then the procedure report has been included in a sealed envelope for you to review at your convenience later.  YOU SHOULD EXPECT: Some feelings of bloating in the abdomen. Passage of more gas than usual.  Walking can help get rid of the air that was put into your GI tract during the procedure and reduce the bloating. If you had a lower endoscopy (such as a colonoscopy or flexible sigmoidoscopy) you may notice spotting of blood in your stool or on the toilet paper. If you underwent a bowel prep for your procedure, you may not have a normal bowel movement for a few days.  Please Note:  You might notice some irritation and congestion in your nose or some drainage.  This is from the oxygen used during your procedure.  There is no need for concern and it should clear up in a day or so.  SYMPTOMS TO REPORT IMMEDIATELY:    Following upper endoscopy (EGD)  Vomiting of blood or coffee ground material  New chest pain or pain under the shoulder blades  Painful or persistently difficult swallowing  New shortness of breath  Fever of 100F or higher  Black, tarry-looking stools  For urgent or emergent issues, a gastroenterologist can be reached at any hour by calling [phone].   DIET:  SEE HANDOUT FOR POST-DILATION DIET!! I recommend CHEWING FOOD WELL AND SLOWLY, ESPECIALLY  BREAD AND MEAT.  TOMORROW AFTERNOON, you may proceed to your regular diet.  Drink plenty of fluids but you should avoid alcoholic beverages for 24 hours.  ACTIVITY:  You should plan to take it easy for the rest of today and you should NOT DRIVE or use heavy machinery until tomorrow (because of the sedation medicines used during the test).    FOLLOW UP: Our staff will call the number listed on your records 48-72 hours following your procedure to check on you and address any questions or concerns that you may have regarding the information given to you following your procedure. If we do not reach you, we will leave a message.  We will attempt to reach you two times.  During this call, we will ask if you have developed any symptoms of COVID 19. If you develop any symptoms (ie: fever, flu-like symptoms, shortness of breath, cough etc.) before then, please call [phone].  If you test positive for Covid 19 in the 2 weeks post procedure, please call and report this information to [place].    If any biopsies were taken you will be contacted by phone or by letter within the next 1-3 weeks.  Please call us at [phone] if you have not heard about the biopsies in 3 weeks.    SIGNATURES/CONFIDENTIALITY: You and/or your care partner have signed paperwork which will be entered into your electronic medical record.  These signatures attest to the fact that that the information above on your After Visit  Summary has been reviewed and is understood.  Full responsibility of the confidentiality of this discharge information lies with you and/or your care-partner. 

## 2019-04-16 NOTE — Progress Notes (Signed)
Pt and sister have to discuss date for next EGD, pt states he does not know what will work at discharge so EGD was not scheduled at this time.

## 2019-04-16 NOTE — Op Note (Signed)
[facility] Patient Name: Marisa SprinklesVincent Rau Procedure Date: 04/16/2019 1:33 PM MRN: [id number] Endoscopist: Lynann Bolognaajesh Braison Snoke , MD Age: [age] Referring MD:  Date of Birth: 12/31/1960 Gender: Male Account #: [id number] Procedure:                Upper GI endoscopy Indications:              Dysphagia, barium swallow showing severe esophageal                            stricture. Medicines:                Monitored Anesthesia Care Procedure:                Pre-Anesthesia Assessment:                           - Prior to the procedure, a History and Physical                            was performed, and patient medications and                            allergies were reviewed. The patient's tolerance of                            previous anesthesia was also reviewed. The risks                            and benefits of the procedure and the sedation                            options and risks were discussed with the patient.                            All questions were answered, and informed consent                            was obtained. Prior Anticoagulants: The patient has                            taken no previous anticoagulant or antiplatelet                            agents. ASA Grade Assessment: III - A patient with                            severe systemic disease. After reviewing the risks                            and benefits, the patient was deemed in                            satisfactory condition to undergo the procedure.  After obtaining informed consent, the endoscope was                            passed under direct vision. Throughout the                            procedure, the patient's blood pressure, pulse, and                            oxygen saturations were monitored continuously. The                            Model GIF-HQ190 [id number]) scope was introduced                            through the mouth, and advanced to the  second part                            of duodenum. The upper GI endoscopy was                            accomplished without difficulty. The patient                            tolerated the procedure well. Scope In: Scope Out: Findings:                 One benign-appearing, intrinsic severe (stenosis;                            an endoscope cannot pass) stenosis was found 35 cm                            from the incisors at the GE junction with                            significant scar tissue. This stenosis measured 8                            mm (inner diameter) x less than one cm (in length).                            The esophagus was dilated. The stenosis was                            traversed after dilation. A TTS dilator was passed                            through the scope. Dilation with an 8.5-9.5-10.5 mm                            balloon and an 09-16-12 mm balloon dilator was  performed to 12 mm. The dilation site was examined                            and showed moderate improvement in luminal                            narrowing. Biopsies were taken with a cold forceps                            for histology. Estimated blood loss: none.                           A 5 cm hiatal hernia was present (from 35 cm up to                            40 cm). No Maliameroon erosions..                           Localized minimal inflammation characterized by                            erythema was found in the gastric antrum. Biopsies                            were taken with a cold forceps for histology.                            Estimated blood loss: none.                           The in the duodenum was normal. Complications:            No immediate complications. Estimated Blood Loss:     Estimated blood loss: none. Impression:               -Severe esophageal stricture s/p esophageal                            dilatation to 12 mm.                            -Dilated esophagus.                           -Moderate sized hiatal hernia.                           -Mild gastritis. Recommendation:           - Patient has a contact number available for                            emergencies. The signs and symptoms of potential                            delayed complications were discussed with the  patient. Return to normal activities tomorrow.                            Written discharge instructions were provided to the                            patient.                           - Post dilatation diet.                           - Continue Protonix 40 mg p.o. twice daily.                           - No aspirin, ibuprofen, naproxen, or other                            non-steroidal anti-inflammatory drugs after biopsy.                           - Chew food especially meats and breads well and                            eat slowly.                           - Repeat upper endoscopy in 4 weeks for retreatment                            and further dilatation. Lynann Bolognaajesh Denzil Bristol, MD 04/16/2019 2:23:04 PM This report has been signed electronically.

## 2019-04-16 NOTE — Progress Notes (Signed)
Called to room to assist during endoscopic procedure.  Patient ID and intended procedure confirmed with present staff. Received instructions for my participation in the procedure from the performing physician.  

## 2019-04-20 ENCOUNTER — Telehealth: Payer: Self-pay

## 2019-04-20 NOTE — Telephone Encounter (Signed)
  Follow up Call-  Call back number 04/16/2019  Post procedure Call Back phone  # [phone]  Permission to leave phone message Yes     Patient questions:  Do you have a fever, pain , or abdominal swelling? No. Pain Score  0 *  Have you tolerated food without any problems? Yes.    Have you been able to return to your normal activities? Yes.    Do you have any questions about your discharge instructions: Diet   No. Medications  No. Follow up visit  No.  Do you have questions or concerns about your Care? No.  Actions: * If pain score is 4 or above: No action needed, pain <4. 1. Have you developed a fever since your procedure? no  2.   Have you had an respiratory symptoms (SOB or cough) since your procedure? no  3.   Have you tested positive for COVID 19 since your procedure no  4.   Have you had any family members/close contacts diagnosed with the COVID 19 since your procedure?  No Spoke with patient's sister since patient was asleep.   If yes to any of these questions please route to Joylene John, RN and Alphonsa Gin, RN.

## 2019-04-20 NOTE — Telephone Encounter (Signed)
No answer, left message to call back later today, B.Zannie Locastro RN. 

## 2019-04-22 ENCOUNTER — Encounter: Payer: Self-pay | Admitting: Gastroenterology

## 2019-06-10 DIAGNOSIS — Z0271 Encounter for disability determination: Secondary | ICD-10-CM

## 2019-06-16 ENCOUNTER — Encounter: Payer: Self-pay | Admitting: Gastroenterology

## 2019-06-16 ENCOUNTER — Telehealth: Payer: Self-pay | Admitting: Gastroenterology

## 2019-06-16 NOTE — Telephone Encounter (Signed)
Pt's sister called stating that medication for leg cramps that Dr. Lyndel Safe prescribed is not working, pt would like another alternative med.

## 2019-06-16 NOTE — Telephone Encounter (Signed)
The cramps are likely due to diuretics (Lasix particularly) Can try Lasix every other day for now (in morning) Please tell patient's sister keep an eye on his weight. If there is increase in the weight, resume Lasix every day.   Thx  RG

## 2019-06-16 NOTE — Telephone Encounter (Signed)
Please review previous message and advise 

## 2019-06-17 NOTE — Telephone Encounter (Signed)
Called and spoke with patient's sister-[name]-verified DPR-[name] informed of MD recommendations and verbalized understanding of information/instructions; [name] also advised to call back to the office should questions/concerns arise;

## 2019-06-29 ENCOUNTER — Ambulatory Visit (AMBULATORY_SURGERY_CENTER): Payer: Self-pay

## 2019-06-29 ENCOUNTER — Other Ambulatory Visit: Payer: Self-pay

## 2019-06-29 VITALS — Temp 97.1°F | Ht 66.0 in | Wt 135.0 lb

## 2019-06-29 DIAGNOSIS — K222 Esophageal obstruction: Secondary | ICD-10-CM

## 2019-06-29 NOTE — Progress Notes (Signed)
Per pt, no allergies to soy or egg products.Pt not taking any weight loss meds or using  O2 at home.  Pt denies sedation problem.  Pt refused emmi video. 

## 2019-07-09 ENCOUNTER — Telehealth: Payer: Self-pay

## 2019-07-09 NOTE — Telephone Encounter (Signed)
Covid-19 screening questions   Do you now or have you had a fever in the last 14 days?  Do you have any respiratory symptoms of shortness of breath or cough now or in the last 14 days?  Do you have any family members or close contacts with diagnosed or suspected Covid-19 in the past 14 days?  Have you been tested for Covid-19 and found to be positive?       

## 2019-07-09 NOTE — Telephone Encounter (Signed)
Pt answered "NO" to all questions °

## 2019-07-10 ENCOUNTER — Encounter: Payer: Self-pay | Admitting: Gastroenterology

## 2019-07-10 ENCOUNTER — Other Ambulatory Visit: Payer: Self-pay

## 2019-07-10 ENCOUNTER — Ambulatory Visit (AMBULATORY_SURGERY_CENTER): Payer: Medicaid Other | Admitting: Gastroenterology

## 2019-07-10 VITALS — BP 121/75 | HR 59 | Temp 97.6°F | Resp 16 | Ht 66.0 in | Wt 135.0 lb

## 2019-07-10 DIAGNOSIS — K2970 Gastritis, unspecified, without bleeding: Secondary | ICD-10-CM | POA: Diagnosis not present

## 2019-07-10 DIAGNOSIS — K219 Gastro-esophageal reflux disease without esophagitis: Secondary | ICD-10-CM

## 2019-07-10 DIAGNOSIS — R1310 Dysphagia, unspecified: Secondary | ICD-10-CM

## 2019-07-10 DIAGNOSIS — K222 Esophageal obstruction: Secondary | ICD-10-CM

## 2019-07-10 DIAGNOSIS — K449 Diaphragmatic hernia without obstruction or gangrene: Secondary | ICD-10-CM

## 2019-07-10 DIAGNOSIS — K7030 Alcoholic cirrhosis of liver without ascites: Secondary | ICD-10-CM

## 2019-07-10 MED ORDER — SODIUM CHLORIDE 0.9 % IV SOLN: 500.0000 mL | Freq: Once | INTRAVENOUS | Status: DC

## 2019-07-10 NOTE — Op Note (Signed)
[facility] Patient Name: Marisa SprinklesVincent Markiewicz Procedure Date: 07/10/2019 9:24 AM MRN: [id number] Endoscopist: Lynann Bolognaajesh Abrea Henle , MD Age: 58 Referring MD:  Date of Birth: 07/12/1961 Gender: Male Account #: [id number] Procedure:                Upper GI endoscopy Indications:              Dysphagia. H/O tight esophageal stricture S/P                            dilatation in the past. Medicines:                Monitored Anesthesia Care Procedure:                Pre-Anesthesia Assessment:                           - Prior to the procedure, a History and Physical                            was performed, and patient medications and                            allergies were reviewed. The patient's tolerance of                            previous anesthesia was also reviewed. The risks                            and benefits of the procedure and the sedation                            options and risks were discussed with the patient.                            All questions were answered, and informed consent                            was obtained. Prior Anticoagulants: The patient has                            taken no previous anticoagulant or antiplatelet                            agents. ASA Grade Assessment: III - A patient with                            severe systemic disease. After reviewing the risks                            and benefits, the patient was deemed in                            satisfactory condition to undergo the procedure.  After obtaining informed consent, the endoscope was                            passed under direct vision. Throughout the                            procedure, the patient's blood pressure, pulse, and                            oxygen saturations were monitored continuously. The                            Endoscope was introduced through the mouth, and                            advanced to the second part of  duodenum. The upper                            GI endoscopy was accomplished without difficulty.                            The patient tolerated the procedure well. Scope In: Scope Out: Findings:                 One benign-appearing, intrinsic severe stenosis was                            found 35 cm from the incisors with luminal diameter                            of 8 mm. The stenosis was traversed after dilation.                            A TTS dilator was passed through the scope.                            Dilation with an 8.5-9.5-10.5 mm balloon and an                            09-16-12 mm balloon dilator was performed to 13 mm.                            The dilation site was examined and showed moderate                            mucosal disruption and moderate improvement in                            luminal narrowing. Estimated blood loss: none.                           A 4-5 cm hiatal hernia was present.  The entire examined stomach was normal.                           The examined duodenum was normal. Complications:            No immediate complications. Estimated Blood Loss:     Estimated blood loss: none. Impression:               - Benign-appearing esophageal stenosis. Dilated.                           - Moderate hiatal hernia.                           - Otherwise normal EGD. Recommendation:           - Patient has a contact number available for                            emergencies. The signs and symptoms of potential                            delayed complications were discussed with the                            patient. Return to normal activities tomorrow.                            Written discharge instructions were provided to the                            patient.                           - Postdilatation diet.                           - Continue present medications.                           - Return to GI clinic in 6  weeks. Would recommend                            repeat dilatation, next time after four-quadrant                            Kenalog injection.                           - Chew food specially meats and breads well and eat                            slowly. Jackquline Denmark, MD 07/10/2019 9:58:50 AM This report has been signed electronically.

## 2019-07-10 NOTE — Progress Notes (Signed)
Called to room to assist during endoscopic procedure.  Patient ID and intended procedure confirmed with present staff. Received instructions for my participation in the procedure from the performing physician.  

## 2019-07-10 NOTE — Progress Notes (Signed)
PT taken to PACU. Monitors in place. VSS. Report given to RN. 

## 2019-07-10 NOTE — Patient Instructions (Signed)
Information on esophagitis given to you today.  Dilation diet -  Clear liquids next 2 hours - until 12:00 noon today.  Soft diet until tomorrow.  YOU HAD AN ENDOSCOPIC PROCEDURE TODAY AT [facility]:   Refer to the procedure report that was given to you for any specific questions about what was found during the examination.  If the procedure report does not answer your questions, please call your gastroenterologist to clarify.  If you requested that your care partner not be given the details of your procedure findings, then the procedure report has been included in a sealed envelope for you to review at your convenience later.  YOU SHOULD EXPECT: Some feelings of bloating in the abdomen. Passage of more gas than usual.  Walking can help get rid of the air that was put into your GI tract during the procedure and reduce the bloating. If you had a lower endoscopy (such as a colonoscopy or flexible sigmoidoscopy) you may notice spotting of blood in your stool or on the toilet paper. If you underwent a bowel prep for your procedure, you may not have a normal bowel movement for a few days.  Please Note:  You might notice some irritation and congestion in your nose or some drainage.  This is from the oxygen used during your procedure.  There is no need for concern and it should clear up in a day or so.  SYMPTOMS TO REPORT IMMEDIATELY:    Following upper endoscopy (EGD)  Vomiting of blood or coffee ground material  New chest pain or pain under the shoulder blades  Painful or persistently difficult swallowing  New shortness of breath  Fever of 100F or higher  Black, tarry-looking stools  For urgent or emergent issues, a gastroenterologist can be reached at any hour by calling [phone].   DIET:  We do recommend a small meal at first, but then you may proceed to your regular diet.  Drink plenty of fluids but you should avoid alcoholic beverages for 24 hours.  ACTIVITY:  You  should plan to take it easy for the rest of today and you should NOT DRIVE or use heavy machinery until tomorrow (because of the sedation medicines used during the test).    FOLLOW UP: Our staff will call the number listed on your records 48-72 hours following your procedure to check on you and address any questions or concerns that you may have regarding the information given to you following your procedure. If we do not reach you, we will leave a message.  We will attempt to reach you two times.  During this call, we will ask if you have developed any symptoms of COVID 19. If you develop any symptoms (ie: fever, flu-like symptoms, shortness of breath, cough etc.) before then, please call [phone].  If you test positive for Covid 19 in the 2 weeks post procedure, please call and report this information to [place].    If any biopsies were taken you will be contacted by phone or by letter within the next 1-3 weeks.  Please call us at [phone] if you have not heard about the biopsies in 3 weeks.    SIGNATURES/CONFIDENTIALITY: You and/or your care partner have signed paperwork which will be entered into your electronic medical record.  These signatures attest to the fact that that the information above on your After Visit Summary has been reviewed and is understood.  Full responsibility of the confidentiality of this discharge information  lies with you and/or your care-partner.

## 2019-07-10 NOTE — Progress Notes (Signed)
Pt's states no medical or surgical changes since previsit or office visit.  June temp  Courtney vitals 

## 2019-07-15 ENCOUNTER — Telehealth: Payer: Self-pay

## 2019-07-15 NOTE — Telephone Encounter (Signed)
  Follow up Call-  Call back number 07/10/2019 04/16/2019  Post procedure Call Back phone  # [phone]  Permission to leave phone message Yes Yes     Patient questions:  Do you have a fever, pain , or abdominal swelling? No. Pain Score  0 *  Have you tolerated food without any problems? Yes.    Have you been able to return to your normal activities? Yes.    Do you have any questions about your discharge instructions: Diet   No. Medications  No. Follow up visit  No.  Do you have questions or concerns about your Care? No.  Actions: * If pain score is 4 or above: No action needed, pai 1. Have you developed a fever since your procedure? no  2.   Have you had an respiratory symptoms (SOB or cough) since your procedure? no  3.   Have you tested positive for COVID 19 since your procedure no  4.   Have you had any family members/close contacts diagnosed with the COVID 19 since your procedure?  no   If yes to any of these questions please route to Joylene John, RN and Alphonsa Gin, Therapist, sports.

## 2019-07-21 ENCOUNTER — Telehealth: Payer: Self-pay

## 2019-07-21 NOTE — Telephone Encounter (Signed)
Pt returned your call.  

## 2019-07-21 NOTE — Telephone Encounter (Signed)
Left message for patient to call back to the office to schedule an abdominal injection by Dr. Bryan Lemma (even though this is a Dr. Lyndel Safe pt)-

## 2019-07-22 NOTE — Telephone Encounter (Signed)
Left message for patient to call back to the office;  

## 2019-07-23 NOTE — Telephone Encounter (Signed)
Patient's sister returned call to the office-[name]-verified DPR-[name] was informed of need for appt with Dr. Bryan Lemma for abd wall injections-patient has been scheduled on 08/14/2019 at 11:40 am; [name] was informed to call back to the office should questions/concerns arise; [name] verbalized understanding of information/instructions-understand appt is at [facility] office;

## 2019-07-23 NOTE — Telephone Encounter (Signed)
Left message for patient to call back to the office;  

## 2019-07-23 NOTE — Telephone Encounter (Signed)
Pt's sister returned phone call.

## 2019-07-30 ENCOUNTER — Other Ambulatory Visit: Payer: Self-pay | Admitting: Gastroenterology

## 2019-08-14 ENCOUNTER — Ambulatory Visit: Payer: Medicaid Other | Admitting: Gastroenterology

## 2019-08-14 ENCOUNTER — Other Ambulatory Visit: Payer: Self-pay

## 2019-08-14 ENCOUNTER — Encounter: Payer: Self-pay | Admitting: Gastroenterology

## 2019-08-14 ENCOUNTER — Telehealth: Payer: Self-pay | Admitting: Gastroenterology

## 2019-08-14 VITALS — BP 112/72 | HR 77 | Temp 98.2°F | Ht 67.0 in | Wt 150.5 lb

## 2019-08-14 DIAGNOSIS — K222 Esophageal obstruction: Secondary | ICD-10-CM

## 2019-08-14 NOTE — Telephone Encounter (Signed)
Called and spoke with patient's sister-verified DPR-Todd Coffey was notified of miscommunication concerning plan of care for the patient-Todd Coffey reports she is not mad she is just upset she took time off of work to bring her brother to his appt; RN apologized for inconvenience and offered a gas card for this; patient acknowledged misunderstanding and was informed that RN will speak with Dr. Lyndel Safe on next step in patient's plan of care for patient to receive accurate care-RN will clarify if patient still needs appt with Dr. Lyndel Safe as scheduled on 08/18/2019; RN will update Todd Coffey as soon as a response is known;  Todd Coffey is advised to call back to the office if patient develops symptoms or symptoms worsen; Todd Coffey verbalized understanding of information/instructions;

## 2019-08-14 NOTE — Progress Notes (Signed)
P    HPI:    Patient is a 58 y.o. male with a history of chronic pancreatitis, EtOH cirrhosis, GERD with esophageal stricture, well-known to Dr. Chales Abrahams.  He was errantly scheduled for an appointment with me today for assessment for MSK Kenalog injection.  He actually has no abdominal pain nor clinical evidence of abdominal wall syndrome, so no plan for abdominal wall injection.  He is actually scheduled for EGD with dilation and intraesophageal Kenalog injection with Dr. Chales Abrahams next week for esophageal stricture.  Aside from dysphagia, he offers no complaints today.     Review of systems:     No chest pain, no SOB, no fevers, no urinary sx   Past Medical History:  Diagnosis Date  . Acute on chronic pancreatitis (HCC)    hx of pancreatitis; nothing recently til today /notes 09/17/2018 (09/17/2018)  . Acute stomach ulcer 07/2018  . Alcohol dependence with withdrawal (HCC) 07/02/2018   w/complications  . Alcohol withdrawal (HCC) 09/19/2016  . Alcohol-induced chronic pancreatitis (HCC) 09/26/2014; 09/19/2016  . 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 Alcoholic liver disease (HCC) 07/21/2018  . Altered mental status 05/06/2013  . Anemia due to acute blood loss 07/2018  . Anxiety attack   . Ascites due to alcoholic hepatitis 07/21/2018  . Chest pain 11/09/2013   "have had chest pains several times" (09/17/2018)  . Chronic alcoholism (HCC)    Hattie Perch 09/17/2018  . Coagulopathy (HCC) unknown  . Family history of von Willebrand disease    "sister, [name]" (09/17/2018)  . Gastrointestinal hemorrhage with melena unknown  . GERD (gastroesophageal reflux disease)   . Headache    "weekly" (09/17/2018)  . History of blood transfusion 06/2018   "10 units in 8 h; probably had some before" (09/17/2018)  . History of cirrhosis of liver    /notes 09/17/2018  . History of hiatal hernia   . Hypertension   . Hypokalemia 09/19/2016  . Hypomagnesemia 09/19/2016  . Irregular heart rate 07/2018  .  Pancreatic mass    pancreatic masses/notes 09/17/2018  . 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 Protein malnutrition (HCC) 07/02/2018   chronic  . Scrotal edema 07/21/2018  . Seizure (HCC)    "since ~ 2002; several; controlled w/RX" (09/17/2018)/over 2 years /pt is weaning off meds  . Subconjunctival hemorrhage of right eye 06/14/2016  . Substance abuse (HCC)   . Tremor 09/26/2014    Patient's surgical history, family medical history, social history, medications and allergies were all reviewed in Epic    Current Outpatient Medications  Medication Sig Dispense Refill  . acetaminophen (TYLENOL) 500 MG tablet Take 1 tablet (500 mg total) by mouth every 6 (six) hours as needed for mild pain or moderate pain. 30 tablet   . Cholecalciferol (VITAMIN D3 PO) Take 1,000 Units by mouth once a week.     . folic acid (FOLVITE) 1 MG tablet Take 1 tablet (1 mg total) by mouth daily. 90 tablet 1  . furosemide (LASIX) 20 MG tablet Take 1 tablet (20 mg total) by mouth daily. 90 tablet 1  . gabapentin (NEURONTIN) 300 MG capsule Take 1 capsule (300 mg total) by mouth 2 (two) times daily. 60 capsule 11  . lactulose (CHRONULAC) 10 GM/15ML solution Take 15 mLs (10 g total) by mouth daily. 1892 mL 2  . metoprolol tartrate (LOPRESSOR) 25 MG tablet Take 1 tablet (25 mg total) by mouth 2 (two) times daily. 180 tablet 1  . pantoprazole (PROTONIX) 40 MG tablet TAKE ONE  TABLET BY MOUTH TWICE DAILY BEFORE MEALS 60 tablet 3  . spironolactone (ALDACTONE) 25 MG tablet Take 1 tablet (25 mg total) by mouth daily. 90 tablet 1  . therapeutic multivitamin-minerals (THERAGRAN-M) tablet Take 1 tablet by mouth daily. 30 tablet 6  . thiamine 100 MG tablet Take 1 tablet (100 mg total) by mouth daily. 30 tablet 6  . levETIRAcetam (KEPPRA) 500 MG tablet Take 1 tablet (500 mg total) by mouth 2 (two) times daily. 180 tablet 4   No current facility-administered medications for this visit.     Physical Exam:     BP 112/72   Pulse  77   Temp 98.2 F (36.8 C)   Ht 5\' 7"  (1.702 m)   Wt 150 lb 8 oz (68.3 kg)   BMI 23.57 kg/m   GENERAL:  Pleasant male in NAD PSYCH: : Cooperative, normal affect EENT:  conjunctiva pink, mucous membranes moist, neck supple without masses CARDIAC:  RRR, no murmur heard, no peripheral edema PULM: Normal respiratory effort, lungs CTA bilaterally, no wheezing ABDOMEN:  Nondistended, soft, nontender. No obvious masses, no hepatomegaly,  normal bowel sounds SKIN:  turgor, no lesions seen Musculoskeletal:  Normal muscle tone, normal strength NEURO: Alert and oriented x 3, no focal neurologic deficits   IMPRESSION and PLAN:    1) GERD 2) Esophageal stricture -Scheduled for EGD with further dilation and esophageal Kenalog injection with Dr. Lyndel Safe next week  3) Chronic pancreatitis 4) EtOH cirrhosis-quit EtOH in 2018 5) History of polysubstance abuse-quit -Continue close follow-up with Dr. Lyndel Safe as previously scheduled          Lavena Bullion ,DO, [facility] 08/14/2019, 12:10 PM

## 2019-08-14 NOTE — Telephone Encounter (Signed)
Pt's sister called and said that they drove to the [facility] office today to get the injections done only to get there and be told that these injections were not going to help and that he did not need them. Sister is frustrated and wants to speak to someone as to why they would drive there and be told they do not need them.

## 2019-08-18 ENCOUNTER — Telehealth (INDEPENDENT_AMBULATORY_CARE_PROVIDER_SITE_OTHER): Payer: Medicaid Other | Admitting: Gastroenterology

## 2019-08-18 ENCOUNTER — Other Ambulatory Visit: Payer: Self-pay

## 2019-08-18 VITALS — Ht 67.0 in | Wt 150.0 lb

## 2019-08-18 DIAGNOSIS — K222 Esophageal obstruction: Secondary | ICD-10-CM

## 2019-08-18 DIAGNOSIS — R1310 Dysphagia, unspecified: Secondary | ICD-10-CM

## 2019-08-18 NOTE — Patient Instructions (Addendum)
    If you are age 58 or older, your body mass index should be between 23-30. Your Body mass index is 23.49 kg/m. If this is out of the aforementioned range listed, please consider follow up with your Primary Care Provider.  If you are age 10 or younger, your body mass index should be between 19-25. Your Body mass index is 23.49 kg/m. If this is out of the aformentioned range listed, please consider follow up with your Primary Care Provider.   You have been scheduled for an endoscopy. Please follow written instructions given to you at your visit today. If you use inhalers (even only as needed), please bring them with you on the day of your procedure. Your physician has requested that you go to www.startemmi.com and enter the access code given to you at your visit today. This web site gives a general overview about your procedure. However, you should still follow specific instructions given to you by our office regarding your preparation for the procedure.   Due to recent COVID-19 restrictions implemented by [employer] and state authorities and in an effort to keep both patients and staff as safe as possible, [place] requires COVID-19 testing prior to any scheduled endoscopic procedure. The testing center is located at [address] [place],  [address].  Testing will be performed Monday through Friday 8 am- 4 pm.  You will require your COVID screen 2 days prior to your endoscopic procedure.  You are not required to quarantine after your screening.  You will only receive a phone call with the results if it is POSITIVE.  If you do not receive a call the day before your procedure you should begin your prep, if ordered, and you should report to the endo center for your procedure at your designated appointment arrival time ( one hour prior to the procedure time). There is no cost to you for the screening on the day of the swab.  [facility] will  file with your insurance company for the testing.    You may receive an automated phone call prior to your procedure or have a message in your MyChart that you have an appointment for a BP/15 at the [facility], please disregard this message.  Your testing will be at the [address] , [address] location.  Thank you,  Dr. Jackquline Denmark

## 2019-08-18 NOTE — Telephone Encounter (Signed)
Called and spoke with [name]-patient's sister-verified DPR- [name] was requested to change patient's OV to virtual due to challenge of getting patient to another office visit- [name] is agreeable to changing visit to virtual and has verified mailing address to have gas card sent to her for her inconvenience of traveling to the office for an unnecessary OV; [name] verbalized she is not mad or upset as she "understands mistakes happen and no harm was caused to my brother"; [name] was advised that she can call the office should further questions/concern arise-[name] verbalized understanding of information/instructions;

## 2019-08-18 NOTE — Progress Notes (Signed)
Chief Complaint: FU  Referring Provider:  Earlyne Iba, NP      ASSESSMENT AND PLAN;   #1. GERD with H/O HH, severe recurrent eso stricture s/p frequent dilatations 11/2016, 07/10/2019  #2. ETOH liver cirrhosis with H/O ascites.  Mild HE with sl elevated ammonia. [place] 03/2028- mild cirrhosis, no ascites or splenomegaly.  Normal flow on Doppler. Nl AFP 03/2019. Alb 4.3 (03/2019)  #3. H/O UGI bleeding d/t erosive esophagitis s/p clipping and endoscopy x3 with severe coagulopathy/alcohol abuse (INR>9)and hypovolemic/septic shock s/p 10U PRBC. No Varices. (Aug 2019 @ [place])  #4. Chronic ETOH pancreatitis (quit ETOH Jun 30, 2018, when he moved to [facility]). MRI/MRCP 09/2018 -acute on chronic pancreatitis.  Possibly pancreas divisum.  Diffusely mildly dilated pancreatic duct. No PD stones/ masses. 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  No exocrine or endocrine pancreatic insufficiency clinically. H/O pancreatic pseudocyst s/p endoscopic drainage in past. Nl CA19-9, CEA 03/2019  #5.  H/O seizures (? ETOH withdrawal)  #6.  H/O multi-substance abuse (cocaine, marijuana, tobacco).  Quit since he moved to [place].  Under strict care of his sister Todd Coffey.  #7.  Comorbid conditions- SZ, anxiety/depression, family history of von Willebrand disease   Plan: -Continue protonix 72m po bid. -EGD with kenalog injection [facility] -Salt and fluid restricted diet.  -Continue low dose lasix and spironolactone. -Monitor weight. -Lactulose (15g/160m 1tbs po qd #1 large bottle, 6 refills. -I have instructed patient that he needs to chew foods especially meats and breads well and eat slowly. -FU televisit in 12 weeks. D/W sister.   HPI:    [name] a [age].o. male   For follow-up Doing very well I talked to his sister most of the time.  He was in the background. Has been compliant with medications, salt restriction. No  alcohol.  Denies having any diarrhea.  Does have normal bowel movements 3-4 times a day.  Still with some dysphagia, but much better than before-mostly solids, mid chest, occasionally However, regurgitation and heartburn has resolved on twice daily Protonix.  No nausea/vomiting.  No melena or hematochezia.  No significant diarrhea or constipation. No unintentional weight loss.  Denies having any abdominal pain.  Feels much better.  No nonsteroidals.  Denies having any jaundice dark urine, pale stools.   Has quit drinking all alcohol, quit all drugs, quit smoking since May 2019.  He was very sick in May with upper GI bleeding/alcohol withdrawal.  [place] feels [place] better now.   Review of past medical records: -H/O UGI bleeding d/t erosive esophagitis s/p clipping and endoscopy x3 with severe coagulopathy/alcohol abuse (INR>9)and hypovolemic/septic shock s/p 10U PRBC. No Varices. (Aug 2019   [place] for mod sigmoid diverticul[place] (report in care everywhere).  No need for 10 years.  EGD 07/10/2019-benign recurrent severe esophageal stenosis measuring 8 mm  S/p dilatation to 13 mm , 4-5 cm hiatal hernia 11/2016-esophageal stricture status post dilatation to 14 mm balloon, 2 cm hiatal hernia, esophagitis, Candida esophagitis, gastritis (report in care everywhere).  No varices  . EGD N/A 06/18/2016  Procedure: ESOPHAGOGASTRODUODENOSCOPY FLEXIBLE TRANSORAL DIAGNOSTIC; Surgeon: WiQuintella ReichertMD  . EGD N/A 09/25/2016  Procedure: ESOPHAGOGASTRODUODENOSCOPY FLEXIBLE TRANSORAL DIAGNOSTIC; Surgeon: ThRevonda HumphreyMD  . HERNIA REPAIR  . PANCREATIC CYST DRAINAGE 1995  Past Medical History:  Diagnosis Date  .  Acute on chronic pancreatitis (HCC)    hx of pancreatitis; nothing recently til today /notes 09/17/2018 (09/17/2018)  . Acute stomach ulcer 07/2018  . Alcohol dependence with withdrawal ([place]) 56/21/3086    w/complications  . Alcohol withdrawal ([place]) 09/19/2016  . Alcohol-induced chronic pancreatitis ([place]) 09/26/2014; 09/19/2016  . Alcoholic gastritis 57/84/6962  . Alcoholic liver disease ([place]) 07/21/2018  . Altered mental status 05/06/2013  . Anemia due to acute blood loss 07/2018  . Anxiety attack   . Ascites due to alcoholic hepatitis 95/28/4132  . Chest pain 11/09/2013   "have had chest pains several times" (09/17/2018)  . Chronic alcoholism ([place])    Archie Endo 09/17/2018  . Coagulopathy ([place]) unknown  . Family history of von Willebrand disease    "sister, Todd Coffey" (09/17/2018)  . Gastrointestinal hemorrhage with melena unknown  . GERD (gastroesophageal reflux disease)   . Headache    "weekly" (09/17/2018)  . History of blood transfusion 06/2018   "10 units in 8 h; probably had some before" (09/17/2018)  . History of cirrhosis of liver    /notes 09/17/2018  . History of hiatal hernia   . Hypertension   . Hypokalemia 09/19/2016  . Hypomagnesemia 09/19/2016  . Irregular heart rate 07/2018  . Pancreatic mass    pancreatic masses/notes 09/17/2018  . 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 Protein malnutrition ([place]) 07/02/2018   chronic  . Scrotal edema 07/21/2018  . Seizure ([place])    "since ~ 2002; several; controlled w/RX" (09/17/2018)/over 2 years /pt is weaning off meds  . Subconjunctival hemorrhage of right eye 06/14/2016  . Substance abuse ([place])   . Tremor 09/26/2014    Past Surgical History:  Procedure Laterality Date  . COLONOSCOPY    . ESOPHAGEAL VARICE LIGATION  06/2018  . ESOPHAGOGASTRODUODENOSCOPY  04/2019  . HEMORRHOID SURGERY    . INGUINAL HERNIA REPAIR Right X 2  . PANCREAS SURGERY     "cyst"  . UPPER GASTROINTESTINAL ENDOSCOPY      Family History  Problem Relation Age of Onset  . Stroke Mother   . Congestive Heart Failure Mother   . Other Father        unsure of history   . Alcohol abuse Maternal Uncle   . Colon cancer Neg Hx   . Esophageal cancer Neg  Hx   . Rectal cancer Neg Hx   . Stomach cancer Neg Hx     Social History   Tobacco Use  . Smoking status: Former Smoker    Packs/day: 0.33    Years: 45.00    Pack years: 14.85    Types: Cigarettes    Quit date: 2019    Years since quitting: 1.7  . Smokeless tobacco: Never Used  . Tobacco comment: tried smokeless 1 or 2 times but it wasn't his thing per patient  Substance Use Topics  . Alcohol use: Not Currently    Comment:  "nothing since 06/30/2018"  . Drug use: Not Currently    Types: Marijuana    Comment: "I've done everything but heroin; last drug use was marijuana in ~ 03/2018"     Current Outpatient Medications  Medication Sig Dispense Refill  . acetaminophen (TYLENOL) 500 MG tablet Take 1 tablet (500 mg total) by mouth every 6 (six) hours as needed for mild pain or moderate pain. 30 tablet   . Cholecalciferol (VITAMIN D3 PO) Take 1,000 Units by mouth once a week.     . folic acid (FOLVITE) 1 MG tablet Take 1 tablet (1  mg total) by mouth daily. 90 tablet 1  . furosemide (LASIX) 20 MG tablet Take 1 tablet (20 mg total) by mouth daily. 90 tablet 1  . gabapentin (NEURONTIN) 300 MG capsule Take 1 capsule (300 mg total) by mouth 2 (two) times daily. 60 capsule 11  . lactulose (CHRONULAC) 10 GM/15ML solution Take 15 mLs (10 g total) by mouth daily. 1892 mL 2  . levETIRAcetam (KEPPRA) 500 MG tablet Take 1 tablet (500 mg total) by mouth 2 (two) times daily. 180 tablet 4  . metoprolol tartrate (LOPRESSOR) 25 MG tablet Take 1 tablet (25 mg total) by mouth 2 (two) times daily. 180 tablet 1  . pantoprazole (PROTONIX) 40 MG tablet TAKE ONE TABLET BY MOUTH TWICE DAILY BEFORE MEALS 60 tablet 3  . spironolactone (ALDACTONE) 25 MG tablet Take 1 tablet (25 mg total) by mouth daily. 90 tablet 1  . therapeutic multivitamin-minerals (THERAGRAN-M) tablet Take 1 tablet by mouth daily. 30 tablet 6  . thiamine 100 MG tablet Take 1 tablet (100 mg total) by mouth daily. 30 tablet 6   No current  facility-administered medications for this visit.     Allergies  Allergen Reactions  . Morphine And Related Other (See Comments)    Unknown per pt, states he was out of it.   Marland Kitchen Peach [Prunus Persica] Hives    Review of Systems:       Physical Exam:    Ht _0  (1.702 m)   Wt 150 lb (68 kg)   BMI 23.49 kg/m  Filed Weights   08/18/19 1452  Weight: 150 lb (68 kg)    Not examined since it was a tele-visit Data Reviewed: I have personally reviewed following labs and imaging studies  CBC: CBC Latest Ref Rng & Units 04/01/2019 10/06/2018 09/20/2018  WBC 4.0 - 10.5 K/uL 6.9 9.7 4.8  Hemoglobin 13.0 - 17.0 g/dL 13.0 12.9(L) 10.9(L)  Hematocrit 39.0 - 52.0 % 37.9(L) 38.0 33.0(L)  Platelets 150.0 - 400.0 K/uL 203.0 265 126(L)    CMP: CMP Latest Ref Rng & Units 04/01/2019 10/06/2018 09/20/2018  Glucose 70 - 99 mg/dL 82 61(L) 98  BUN 6 - 23 mg/dL 13 12 <5(L)  Creatinine 0.40 - 1.50 mg/dL 0.85 0.58(L) 0.61  Sodium 135 - 145 mEq/L 137 137 136  Potassium 3.5 - 5.1 mEq/L 3.9 4.2 4.6  Chloride 96 - 112 mEq/L 102 97 105  CO2 19 - 32 mEq/L _1 Calcium 8.4 - 10.5 mg/dL 9.2 9.3 8.4(L)  Total Protein 6.0 - 8.3 g/dL 7.6 7.2 6.0(L)  Total Bilirubin 0.2 - 1.2 mg/dL 0.4 0.3 0.6  Alkaline Phos 39 - 117 U/L 97 116 98  AST 0 - 37 U/L 29 33 31  ALT 0 - 53 U/L _2 Hepatic Function Latest Ref Rng & Units 04/01/2019 10/06/2018 09/20/2018  Total Protein 6.0 - 8.3 g/dL 7.6 7.2 6.0(L)  Albumin 3.5 - 5.2 g/dL 4.3 3.7 2.6(L)  AST 0 - 37 U/L 29 33 31  ALT 0 - 53 U/L _3 Alk Phosphatase 39 - 117 U/L 97 116 98  Total Bilirubin 0.2 - 1.2 mg/dL 0.4 0.3 0.6     This service was provided via telemedicine.  The patient was located at home.  The provider was located in office.  The patient did consent to this tele- visit and is aware of possible charges through their insurance for this visit.  The patient was referred by ED.  The  other persons participating in this telemedicine service  were Sister Todd Coffey and their role was caregiver.  Time spent on call, review of records and coordination of care: 25 min    Carmell Austria, MD 08/18/2019, 4:36 PM  Cc: Todd Iba, NP

## 2019-09-09 ENCOUNTER — Other Ambulatory Visit: Payer: Self-pay | Admitting: Gastroenterology

## 2019-09-09 ENCOUNTER — Inpatient Hospital Stay (HOSPITAL_COMMUNITY): Admission: RE | Admit: 2019-09-09 | Payer: Medicaid Other | Source: Ambulatory Visit

## 2019-09-09 LAB — SARS CORONAVIRUS 2 (TAT 6-24 HRS): SARS Coronavirus 2: NEGATIVE

## 2019-09-11 ENCOUNTER — Other Ambulatory Visit: Payer: Self-pay

## 2019-09-11 ENCOUNTER — Encounter: Payer: Self-pay | Admitting: Gastroenterology

## 2019-09-11 ENCOUNTER — Ambulatory Visit (AMBULATORY_SURGERY_CENTER): Payer: Medicaid Other | Admitting: Gastroenterology

## 2019-09-11 VITALS — BP 126/81 | HR 73 | Temp 98.4°F | Resp 20 | Ht 67.0 in | Wt 150.0 lb

## 2019-09-11 DIAGNOSIS — K222 Esophageal obstruction: Secondary | ICD-10-CM

## 2019-09-11 DIAGNOSIS — K449 Diaphragmatic hernia without obstruction or gangrene: Secondary | ICD-10-CM | POA: Diagnosis not present

## 2019-09-11 MED ORDER — SODIUM CHLORIDE 0.9 % IV SOLN: 500.0000 mL | Freq: Once | INTRAVENOUS | Status: DC

## 2019-09-11 NOTE — Patient Instructions (Signed)
YOU HAD AN ENDOSCOPIC PROCEDURE TODAY AT THE [facility]:   Refer to the procedure report that was given to you for any specific questions about what was found during the examination.  If the procedure report does not answer your questions, please call your gastroenterologist to clarify.  If you requested that your care partner not be given the details of your procedure findings, then the procedure report has been included in a sealed envelope for you to review at your convenience later.  YOU SHOULD EXPECT: Some feelings of bloating in the abdomen. Passage of more gas than usual.  Walking can help get rid of the air that was put into your GI tract during the procedure and reduce the bloating. If you had a lower endoscopy (such as a colonoscopy or flexible sigmoidoscopy) you may notice spotting of blood in your stool or on the toilet paper. If you underwent a bowel prep for your procedure, you may not have a normal bowel movement for a few days.  Please Note:  You might notice some irritation and congestion in your nose or some drainage.  This is from the oxygen used during your procedure.  There is no need for concern and it should clear up in a day or so.  SYMPTOMS TO REPORT IMMEDIATELY:   Following upper endoscopy (EGD)  Vomiting of blood or coffee ground material  New chest pain or pain under the shoulder blades  Painful or persistently difficult swallowing  New shortness of breath  Fever of 100F or higher  Black, tarry-looking stools  For urgent or emergent issues, a gastroenterologist can be reached at any hour by calling [phone].   DIET:  We do recommend a small meal at first, but then you may proceed to your regular diet.  Drink plenty of fluids but you should avoid alcoholic beverages for 24 hours.  ACTIVITY:  You should plan to take it easy for the rest of today and you should NOT DRIVE or use heavy machinery until tomorrow (because of the sedation medicines used  during the test).    FOLLOW UP: Our staff will call the number listed on your records 48-72 hours following your procedure to check on you and address any questions or concerns that you may have regarding the information given to you following your procedure. If we do not reach you, we will leave a message.  We will attempt to reach you two times.  During this call, we will ask if you have developed any symptoms of COVID 19. If you develop any symptoms (ie: fever, flu-like symptoms, shortness of breath, cough etc.) before then, please call [phone].  If you test positive for Covid 19 in the 2 weeks post procedure, please call and report this information to us.    If any biopsies were taken you will be contacted by phone or by letter within the next 1-3 weeks.  Please call us at [phone] if you have not heard about the biopsies in 3 weeks.    SIGNATURES/CONFIDENTIALITY: You and/or your care partner have signed paperwork which will be entered into your electronic medical record.  These signatures attest to the fact that that the information above on your After Visit Summary has been reviewed and is understood.  Full responsibility of the confidentiality of this discharge information lies with you and/or your care-partner. 

## 2019-09-11 NOTE — Progress Notes (Signed)
Called to room to assist during endoscopic procedure.  Patient ID and intended procedure confirmed with present staff. Received instructions for my participation in the procedure from the performing physician.  

## 2019-09-11 NOTE — Op Note (Signed)
[facility] Patient Name: Todd Coffey Procedure Date: 09/11/2019 9:40 AM MRN: [id number] Endoscopist: Jackquline Denmark , MD Age: 58 Referring MD:  Date of Birth: Jul 25, 1961 Gender: Male Account #: [id number] Procedure:                Upper GI endoscopy Indications:              Dysphagia d/t refractory esophageal stricture s/p                            multiple esophageal dilatations in the past. Last                            dilatation 07/10/2019. Medicines:                Monitored Anesthesia Care Procedure:                Pre-Anesthesia Assessment:                           - Prior to the procedure, a History and Physical                            was performed, and patient medications and                            allergies were reviewed. The patient's tolerance of                            previous anesthesia was also reviewed. The risks                            and benefits of the procedure and the sedation                            options and risks were discussed with the patient.                            All questions were answered, and informed consent                            was obtained. Prior Anticoagulants: The patient has                            taken no previous anticoagulant or antiplatelet                            agents. ASA Grade Assessment: III - A patient with                            severe systemic disease. After reviewing the risks                            and benefits, the patient was deemed in  satisfactory condition to undergo the procedure.                           After obtaining informed consent, the endoscope was                            passed under direct vision. Throughout the                            procedure, the patient's blood pressure, pulse, and                            oxygen saturations were monitored continuously. The                            Endoscope was introduced through  the mouth, and                            advanced to the second part of duodenum. The upper                            GI endoscopy was accomplished without difficulty.                            The patient tolerated the procedure well. Scope In: Scope Out: Findings:                 One benign-appearing, intrinsic severe (stenosis;                            an endoscope cannot pass) stenosis was found 35 cm                            from the incisors. This stenosis measured 8 mm                            (inner diameter) x less than one cm (in length).                            The stenosis was traversed after dilation. A TTS                            dilator was passed through the scope. Dilation with                            an 09-16-12 mm balloon dilator was performed to 13                            mm. The dilation site was examined and showed                            moderate improvement in luminal narrowing with  mucosal rent. Area was successfully injected with                            /0.62ml of triamcinolone (Aristocort, Kenacort,                            Kenalog) in all 4 quadrants (total ). Estimated                            blood loss was minimal.                           A 4 cm hiatal hernia was present.                           The entire examined stomach was normal.                           The examined duodenum was normal. Complications:            No immediate complications. Estimated Blood Loss:     Estimated blood loss: none. Impression:               - Benign-appearing esophageal stenosis. Dilated.                            Injected.                           - 4 cm hiatal hernia. Recommendation:           - Patient has a contact number available for                            emergencies. The signs and symptoms of potential                            delayed complications were discussed with the                             patient. Return to normal activities tomorrow.                            Written discharge instructions were provided to the                            patient.                           - Post dilatation diet.                           - Continue present medications.                           - No aspirin, ibuprofen, naproxen, or other  non-steroidal anti-inflammatory drugs.                           - FU in 8 weeks. Can do televisit due to the                            distance. Lynann Bologna, MD 09/11/2019 10:23:39 AM This report has been signed electronically.

## 2019-09-11 NOTE — Progress Notes (Signed)
Report to PACU, RN, vss, BBS= Clear.  

## 2019-09-11 NOTE — Progress Notes (Signed)
Temp taken by LC VS taken by CW 

## 2019-09-11 NOTE — Progress Notes (Signed)
Kenalog drawn up and injected by Myer Peer per order Dr Marcene Corning Stricture

## 2019-09-15 ENCOUNTER — Telehealth: Payer: Self-pay | Admitting: *Deleted

## 2019-09-15 NOTE — Telephone Encounter (Signed)
  Follow up Call-  Call back number 09/11/2019 07/10/2019 04/16/2019  Post procedure Call Back phone  # [phone]  Permission to leave phone message Yes Yes Yes     Patient questions:  Do you have a fever, pain , or abdominal swelling? No. Pain Score  0 *  Have you tolerated food without any problems? Yes.    Have you been able to return to your normal activities? Yes.    Do you have any questions about your discharge instructions: Diet   No. Medications  No. Follow up visit  No.  Do you have questions or concerns about your Care? No.  Actions: * If pain score is 4 or above: No action needed, pain <4. 1. Have you developed a fever since your procedure? no  2.   Have you had an respiratory symptoms (SOB or cough) since your procedure? no  3.   Have you tested positive for COVID 19 since your procedure no  4.   Have you had any family members/close contacts diagnosed with the COVID 19 since your procedure?  no   If yes to any of these questions please route to Joylene John, RN and Alphonsa Gin, Therapist, sports.

## 2019-10-06 DIAGNOSIS — B029 Zoster without complications: Secondary | ICD-10-CM

## 2019-10-06 HISTORY — DX: Zoster without complications: B02.9

## 2019-10-23 ENCOUNTER — Encounter: Payer: Self-pay | Admitting: Gastroenterology

## 2019-10-23 ENCOUNTER — Telehealth (INDEPENDENT_AMBULATORY_CARE_PROVIDER_SITE_OTHER): Payer: Medicaid Other | Admitting: Gastroenterology

## 2019-10-23 ENCOUNTER — Other Ambulatory Visit: Payer: Self-pay

## 2019-10-23 VITALS — Ht 67.0 in | Wt 150.0 lb

## 2019-10-23 DIAGNOSIS — K222 Esophageal obstruction: Secondary | ICD-10-CM

## 2019-10-23 NOTE — Progress Notes (Signed)
Chief Complaint: FU  Referring Provider:  Earlyne Iba, NP      ASSESSMENT AND PLAN;   #1. GERD with H/O HH, severe recurrent eso stricture s/p frequent dilatations 11/2016, 07/10/2019 09/2019 (with steroid injection)  #2. ETOH liver cirrhosis with H/O ascites.  Mild HE with sl elevated ammonia. [place] 03/2028- mild cirrhosis, no ascites or splenomegaly.  Normal flow on Doppler. Nl AFP 03/2019. Alb 4.3 (03/2019)  #3. H/O UGI bleeding d/t erosive esophagitis s/p clipping and endoscopy x3 with severe coagulopathy/alcohol abuse (INR>9)and hypovolemic/septic shock s/p 10U PRBC. No Varices. (Aug 2019 @ [place])  #4. Chronic ETOH pancreatitis (quit ETOH Jun 30, 2018, when he moved to [facility]). MRI/MRCP 09/2018 -acute on chronic pancreatitis.  Possibly pancreas divisum.  Diffusely mildly dilated pancreatic duct. No PD stones/ masses. 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  No exocrine or endocrine pancreatic insufficiency clinically. H/O pancreatic pseudocyst s/p endoscopic drainage in past. Nl CA19-9, CEA 03/2019  #5.  H/O seizures (? ETOH withdrawal)  #6.  H/O multi-substance abuse (cocaine, marijuana, tobacco).  Quit since he moved to [facility].  Under strict care of his sister [name].  #7.  Comorbid conditions- SZ, anxiety/depression, family history of von Willebrand disease  #8. Shingles (left abdo)   Plan: -Continue protonix 32m po bid. -[facility]  -Continue low dose lasix and spironolactone. -Can increase gabapentin to QID until pain (from shingles) gets better, then cut in down to bid. -Continue rest of the medications -I have instructed patient that he needs to chew foods especially meats and breads well and eat slowly.   HPI:    Todd Graumannis a 58y.o. male   For follow-up Doing very well I talked to his sister most of the time.  He was in the background.  Had  shingles-left abdomen going to the back (not crossing midline).  Has been under a lot of pain.  Took gabapentin twice a day which did help somewhat.  Swallowing is much better.  Had 2 episodes of dysphagia ever since last dilatation.  Would like to have dilatation set up in February.  Can always reschedule if he feels better. Has been compliant with medications, salt restriction. No alcohol.  Has quit drinking all alcohol, quit all drugs, quit smoking since May 2019.  He was very sick in May with upper GI bleeding/alcohol withdrawal.  [name]  [place] feels much better now.   Review of past medical records: -H/O UGI bleeding d/t erosive esophagitis s/p clipping and endoscopy x3 with severe coagulopathy/alcohol abuse (INR>9)and hypovolemic/septic shock s/p 10U PRBC. No Varices. (Aug 2019   [place] except for mod sigmoid diverticulosis (report in care everywhere).  No need for 10 years.  Past Medical History:  Diagnosis Date  . Acute on chronic pancreatitis (HCC)    hx of pancreatitis; nothing recently til today /notes 09/17/2018 (09/17/2018)  . Acute stomach ulcer 07/2018  . Alcohol dependence with withdrawal ([facility] 081/44/8185  w/complications  . Alcohol withdrawal ([facility] 09/19/2016  . [place] chronic [place]itis ([place] 09/26/2014; 09/19/2016  . Alcoholic gastritis 163/14/9702 . Alcoholic liver disease ([facility] 07/21/2018  . Altered mental status 05/06/2013  . Anemia due to acute blood loss 07/2018  . Anxiety attack   . Ascites due to alcoholic hepatitis 063/78/5885 . Chest pain 11/09/2013   "have  had chest pains several times" (09/17/2018)  . Chronic alcoholism ([place])    Archie Endo 09/17/2018  . Coagulopathy ([place]) unknown  . Family history of von Willebrand disease    "sister, [name]" (09/17/2018)  . Gastrointestinal hemorrhage with melena unknown  . GERD (gastroesophageal reflux disease)   . Headache     "weekly" (09/17/2018)  . History of blood transfusion 06/2018   "10 units in 8 h; probably had some before" (09/17/2018)  . History of cirrhosis of liver    /notes 09/17/2018  . History of hiatal hernia   . History of shingles   . Hypertension   . Hypokalemia 09/19/2016  . Hypomagnesemia 09/19/2016  . Irregular heart rate 07/2018  . Pancreatic mass    pancreatic masses/notes 09/17/2018  . 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 Protein malnutrition ([place]) 07/02/2018   chronic  . Scrotal edema 07/21/2018  . Seizure ([place])    "since ~ 2002; several; controlled w/RX" (09/17/2018)/over 2 years /pt is weaning off meds  . Subconjunctival hemorrhage of right eye 06/14/2016  . Substance abuse ([place])   . Tremor 09/26/2014    Past Surgical History:  Procedure Laterality Date  . COLONOSCOPY    . ESOPHAGEAL VARICE LIGATION  06/2018  . ESOPHAGOGASTRODUODENOSCOPY  04/2019  . HEMORRHOID SURGERY    . INGUINAL HERNIA REPAIR Right X 2  . PANCREAS SURGERY     "cyst"  . UPPER GASTROINTESTINAL ENDOSCOPY      Family History  Problem Relation Age of Onset  . Stroke Mother   . Congestive Heart Failure Mother   . Other Father        unsure of history   . Alcohol abuse Maternal Uncle   . Colon cancer Neg Hx   . Esophageal cancer Neg Hx   . Rectal cancer Neg Hx   . Stomach cancer Neg Hx     Social History   Tobacco Use  . Smoking status: Former Smoker    Packs/day: 0.33    Years: 45.00    Pack years: 14.85    Types: Cigarettes    Quit date: 2019    Years since quitting: 1.9  . Smokeless tobacco: Never Used  . Tobacco comment: tried smokeless 1 or 2 times but it wasn't his thing per patient  Substance Use Topics  . Alcohol use: Not Currently    Comment:  "nothing since 06/30/2018"  . Drug use: Not Currently    Types: Marijuana    Comment: "I've done everything but heroin; last drug use was marijuana in ~ 03/2018"     Current Outpatient Medications  Medication Sig Dispense Refill  .  acetaminophen (TYLENOL) 500 MG tablet Take 1 tablet (500 mg total) by mouth every 6 (six) hours as needed for mild pain or moderate pain. 30 tablet   . Cholecalciferol (VITAMIN D3 PO) Take 1,000 Units by mouth once a week.     . folic acid (FOLVITE) 1 MG tablet Take 1 tablet (1 mg total) by mouth daily. 90 tablet 1  . furosemide (LASIX) 20 MG tablet Take 1 tablet (20 mg total) by mouth daily. 90 tablet 1  . gabapentin (NEURONTIN) 300 MG capsule Take 1 capsule (300 mg total) by mouth 2 (two) times daily. 60 capsule 11  . lactulose (CHRONULAC) 10 GM/15ML solution Take 15 mLs (10 g total) by mouth daily. 1892 mL 2  . metoprolol tartrate (LOPRESSOR) 25 MG tablet Take 1 tablet (25 mg total) by mouth 2 (two) times daily. 180 tablet 1  .  pantoprazole (PROTONIX) 40 MG tablet TAKE ONE TABLET BY MOUTH TWICE DAILY BEFORE MEALS 60 tablet 3  . predniSONE (STERAPRED UNI-PAK 21 TAB) 10 MG (21) TBPK tablet Take 10 mg by mouth as directed.    Marland Kitchen spironolactone (ALDACTONE) 25 MG tablet Take 1 tablet (25 mg total) by mouth daily. 90 tablet 1  . therapeutic multivitamin-minerals (THERAGRAN-M) tablet Take 1 tablet by mouth daily. 30 tablet 6  . thiamine 100 MG tablet Take 1 tablet (100 mg total) by mouth daily. 30 tablet 6  . levETIRAcetam (KEPPRA) 500 MG tablet Take 1 tablet (500 mg total) by mouth 2 (two) times daily. 180 tablet 4   No current facility-administered medications for this visit.    Allergies  Allergen Reactions  . Morphine And Related Other (See Comments)    Unknown per pt, states he was out of it.   Marland Kitchen Peach [Prunus Persica] Hives    Fresh peaches    Review of Systems:       Physical Exam:    Ht '5\' 7"'  (1.702 m)   Wt 150 lb (68 kg)   BMI 23.49 kg/m  Filed Weights   10/23/19 0805  Weight: 150 lb (68 kg)    Not examined since it was a tele-visit Data Reviewed: I have personally reviewed following labs and imaging studies  CBC: CBC Latest Ref Rng & Units 04/01/2019 10/06/2018  09/20/2018  WBC 4.0 - 10.5 K/uL 6.9 9.7 4.8  Hemoglobin 13.0 - 17.0 g/dL 13.0 12.9(L) 10.9(L)  Hematocrit 39.0 - 52.0 % 37.9(L) 38.0 33.0(L)  Platelets 150.0 - 400.0 K/uL 203.0 265 126(L)    CMP: CMP Latest Ref Rng & Units 04/01/2019 10/06/2018 09/20/2018  Glucose 70 - 99 mg/dL 82 61(L) 98  BUN 6 - 23 mg/dL 13 12 <5(L)  Creatinine 0.40 - 1.50 mg/dL 0.85 0.58(L) 0.61  Sodium 135 - 145 mEq/L 137 137 136  Potassium 3.5 - 5.1 mEq/L 3.9 4.2 4.6  Chloride 96 - 112 mEq/L 102 97 105  CO2 19 - 32 mEq/L '26 22 26  ' Calcium 8.4 - 10.5 mg/dL 9.2 9.3 8.4(L)  Total Protein 6.0 - 8.3 g/dL 7.6 7.2 6.0(L)  Total Bilirubin 0.2 - 1.2 mg/dL 0.4 0.3 0.6  Alkaline Phos 39 - 117 U/L 97 116 98  AST 0 - 37 U/L 29 33 31  ALT 0 - 53 U/L '31 20 18   ' Hepatic Function Latest Ref Rng & Units 04/01/2019 10/06/2018 09/20/2018  Total Protein 6.0 - 8.3 g/dL 7.6 7.2 6.0(L)  Albumin 3.5 - 5.2 g/dL 4.3 3.7 2.6(L)  AST 0 - 37 U/L 29 33 31  ALT 0 - 53 U/L '31 20 18  ' Alk Phosphatase 39 - 117 U/L 97 116 98  Total Bilirubin 0.2 - 1.2 mg/dL 0.4 0.3 0.6     This service was provided via telemedicine.  The patient was located at home.  The provider was located in office.  The patient did consent to this tele- visit and is aware of possible charges through their insurance for this visit.  The patient was referred by ED.  The other persons participating in this telemedicine service were Sister [name] and their role was caregiver.  Time spent on call, review of records and coordination of care: 25 min    Carmell Austria, MD 10/23/2019, 1:56 PM  Cc: Earlyne Iba, NP

## 2019-10-23 NOTE — Patient Instructions (Signed)
If you are age 58 or older, your body mass index should be between 23-30. Your Body mass index is 23.49 kg/m. If this is out of the aforementioned range listed, please consider follow up with your Primary Care Provider.  If you are age 57 or younger, your body mass index should be between 19-25. Your Body mass index is 23.49 kg/m. If this is out of the aformentioned range listed, please consider follow up with your Primary Care Provider.   It has been recommended to you by your physician that you have a(n) EGD completed. Per your request, we did not schedule the procedure(s) today. Please contact our office at [phone] should you decide to have the procedure completed. You will be scheduled for a pre-visit and procedure at that time.     Thank you,  Dr. Jackquline Denmark

## 2019-11-06 HISTORY — PX: ESOPHAGOGASTRODUODENOSCOPY (EGD) WITH PROPOFOL: SHX5813

## 2019-12-04 ENCOUNTER — Other Ambulatory Visit: Payer: Self-pay

## 2019-12-04 ENCOUNTER — Ambulatory Visit (AMBULATORY_SURGERY_CENTER): Payer: Self-pay | Admitting: *Deleted

## 2019-12-04 VITALS — Temp 96.9°F | Ht 66.5 in | Wt 154.0 lb

## 2019-12-04 DIAGNOSIS — K222 Esophageal obstruction: Secondary | ICD-10-CM

## 2019-12-04 DIAGNOSIS — Z01818 Encounter for other preprocedural examination: Secondary | ICD-10-CM

## 2019-12-04 NOTE — Progress Notes (Signed)
Patient is here in-person for PV. Patient denies any allergies to eggs or soy. Patient denies any problems with anesthesia/sedation. Patient denies any oxygen use at home. Patient denies taking any diet/weight loss medications or blood thinners. Patient is not being treated for MRSA or C-diff. No changes in medical hx per pt.    COVID-19 screening test is on 12/16/2019, the pt is aware. Pt is aware that care partner will wait in the car during procedure; if they feel like they will be too hot or cold to wait in the car; they may wait in the 4 th floor lobby. Patient is aware to bring only one care partner. We want them to wear a mask (we do not have any that we can provide them), practice social distancing, and we will check their temperatures when they get here.  I did remind the patient that their care partner needs to stay in the parking lot the entire time and have a cell phone available, we will call them when the pt is ready for discharge. Patient will wear mask into building.

## 2019-12-08 ENCOUNTER — Telehealth: Payer: Self-pay

## 2019-12-08 NOTE — Telephone Encounter (Signed)
I returned the call to Todd Low, NP who is his primary care provider. States she checked labs and his Keppra level was abnormal. After speaking w/ the patient and his sister, it was determined the patient had not been taking it. He mixed up his Keppra with his acid reflux medication that he only takes prn. She instructed him to restart the Keppra today.   I called his sister, Todd Coffey, who states she is making him a list of his medications with the reasons they are being prescribed. She will make sure he takes the Keppra correctly. Reports that no seizure activity has occurred. She schedule a follow up here on 12/14/2019.

## 2019-12-08 NOTE — Telephone Encounter (Signed)
Patients PCP called to check on patient lab results. Please follow up

## 2019-12-14 ENCOUNTER — Encounter: Payer: Self-pay | Admitting: Neurology

## 2019-12-14 ENCOUNTER — Telehealth (INDEPENDENT_AMBULATORY_CARE_PROVIDER_SITE_OTHER): Payer: Medicaid Other | Admitting: Neurology

## 2019-12-14 DIAGNOSIS — R569 Unspecified convulsions: Secondary | ICD-10-CM | POA: Diagnosis not present

## 2019-12-14 DIAGNOSIS — G40909 Epilepsy, unspecified, not intractable, without status epilepticus: Secondary | ICD-10-CM

## 2019-12-14 MED ORDER — LEVETIRACETAM 500 MG PO TABS: 500.0000 mg | ORAL_TABLET | Freq: Two times a day (BID) | ORAL | 4 refills | Status: DC

## 2019-12-14 NOTE — Progress Notes (Signed)
Virtual Visit via Video Note  I connected with [name] on 12/14/19 at  3:45 PM EST by a video enabled telemedicine application and verified that I am speaking with the correct person using two identifiers.  Location: Patient: at his home Provider: in the office    I discussed the limitations of evaluation and management by telemedicine and the availability of in person appointments. The patient expressed understanding and agreed to proceed.  History of Present Illness: Todd Coffey is a 59 year old male, seen in request by his primary care nurse practitioner Orlinda Blalock for evaluation of seizure, he is accompanied by his sister Particia Coffey, who is also his power of attorney.  Initial evaluation was on January 06, 2019.   I have reviewed and summarized the referring note from the referring physician.  I was able to review his hospital discharge on September 20, 2018, he had a history of long-time alcoholism, alcoholic liver cirrhosis with varices, pancreatitis, pseudocyst, moved from [place] to [place] to live with his sister, was admitted to hospital in November 2019 for recurrent pancreatitis, abdominal pain, MRI of abdomen on September 18, 2018 showed change of acute on chronic pancreatitis, distended gallbladder, chronic splenic vein thrombosis with extensive perisplenic and epigastric collaterals, numerous small mesenteric and retroperitoneal lymph node,  He had a long history of heavy alcoholism, he reported started drinking since 59 years old, about 20 years ago, when he stopped heavy drinking, he would have recurrent seizure, he was initially treated with Dilantin, over past 10 years, he was in and out of incarceration, sometimes have recurrent seizure often related withdrawal from alcohol, he also have a history of polysubstance abuse, cocaine, marijuana, last use was in 2017.   He was started on Keppra since 2018 after recurrent seizure, that was the last seizure he has,  he stopped drinking since August 2019,  He gradually developed worsening gait abnormality since 2018, previously contributed to his substance abuse, getting worse since August 2019, unsteady gait, tendency to fall,  He also complains of memory loss, difficulty focusing, bilateral feet paresthesia  Laboratory evaluations in January 2020, Keppra level was 2.2, normal magnesium, TSH, ammonia level, CMP showed no significant abnormality.CBC showed hemoglobin of 12.9,  Video Visit Dr. Krista Blue 04/09/2019 S  History of Present Illness: I saw patient with his sister [name], he is overall doing well, has no recurrent seizure, taking keppra 500mg  bid.   EEG was normal on January 15 2019.  I personally reviewed MRI of cervical spine, multilevel degenerative changes, there was no acute abnormalities. MRI of the brain, mild to moderate generalized atrophy, mild corpus callosum and cerebellum vermis atrophy, periventricular small vessel disease  Update December 14, 2019 SS: Mr. Enochs presents today for virtual visit.  He continues to do well.  He has not had recurrent seizure.  He remains on Keppra 500 mg twice a day.  He recently saw his primary doctor, his Keppra level was low.  Turns out he got his medications mixed up, and was not taking Keppra.  He is now back on Keppra.  He is doing well.  He is recovering from shingles, which has been painful.  He lives with his sister, Todd Coffey.  He does not drink alcohol or drive a car.  He is also taking gabapentin for leg cramps.  He has not had any falls, but has chronic issues with balance.  His sister Todd Coffey is present.   Observations/Objective: Via virtual visit, is alert and oriented, speech is clear  and concise, facial symmetry noted, no arm drift, gait is wide-based, cautious  Assessment and Plan: 1.  History of heavy alcohol use, last was in August 2019, also has history of polysubstance abuse, cocaine, marijuana 2.  History of seizure, last seizure was in  2018, often related to alcohol withdrawal 3.  Memory loss 4.  Progressive worsening gait abnormality 5.  Bilateral lower extremity paresthesia, length dependent sensory changes -Overall, seems to be stable, did have mistake with his medications, was not taking Keppra, but did not have recurrent seizure, is now back on Keppra -Per Dr. 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 The patient was provided an opportunity to ask questions and all were answered. The patient agreed with the plan and demonstrated an understanding of the instructions.   The patient was advised to call back or seek an in-person evaluation if the symptoms worsen or if the condition fails to improve as anticipated.  I provided 15 minutes of non-face-to-face time during this encounter.  Otila Kluver, DNP  [facility] [address] [place], [place] [address] [phone]

## 2019-12-16 ENCOUNTER — Ambulatory Visit (INDEPENDENT_AMBULATORY_CARE_PROVIDER_SITE_OTHER): Payer: Medicaid Other

## 2019-12-16 ENCOUNTER — Other Ambulatory Visit: Payer: Self-pay

## 2019-12-16 ENCOUNTER — Other Ambulatory Visit: Payer: Self-pay | Admitting: Gastroenterology

## 2019-12-16 DIAGNOSIS — Z1159 Encounter for screening for other viral diseases: Secondary | ICD-10-CM

## 2019-12-16 LAB — SARS CORONAVIRUS 2 (TAT 6-24 HRS): SARS Coronavirus 2: NEGATIVE

## 2019-12-18 ENCOUNTER — Ambulatory Visit (AMBULATORY_SURGERY_CENTER): Payer: Medicaid Other | Admitting: Gastroenterology

## 2019-12-18 ENCOUNTER — Other Ambulatory Visit: Payer: Self-pay

## 2019-12-18 ENCOUNTER — Encounter: Payer: Self-pay | Admitting: Gastroenterology

## 2019-12-18 VITALS — BP 122/65 | HR 60 | Temp 97.8°F | Resp 15 | Ht 66.5 in | Wt 154.0 lb

## 2019-12-18 DIAGNOSIS — K222 Esophageal obstruction: Secondary | ICD-10-CM | POA: Diagnosis not present

## 2019-12-18 DIAGNOSIS — R1310 Dysphagia, unspecified: Secondary | ICD-10-CM

## 2019-12-18 DIAGNOSIS — R449 Unspecified symptoms and signs involving general sensations and perceptions: Secondary | ICD-10-CM

## 2019-12-18 MED ORDER — SODIUM CHLORIDE 0.9 % IV SOLN: 500.0000 mL | Freq: Once | INTRAVENOUS | Status: DC

## 2019-12-18 NOTE — Patient Instructions (Signed)
POST-DILATION DIET.  SEE HANDOUT. NO ASPIRIN, IBUPROFEN, NAPROXEN, OR OTHER NON-STEROIDAL INFLAMMATORY DRUGS. TAKE TYLENOL IF YOU NEED SOMETHING OTC FOR PAIN.    YOU HAD AN ENDOSCOPIC PROCEDURE TODAY AT THE [facility]:   Refer to the procedure report that was given to you for any specific questions about what was found during the examination.  If the procedure report does not answer your questions, please call your gastroenterologist to clarify.  If you requested that your care partner not be given the details of your procedure findings, then the procedure report has been included in a sealed envelope for you to review at your convenience later.  YOU SHOULD EXPECT: Some feelings of bloating in the abdomen. Passage of more gas than usual.  Walking can help get rid of the air that was put into your GI tract during the procedure and reduce the bloating. If you had a lower endoscopy (such as a colonoscopy or flexible sigmoidoscopy) you may notice spotting of blood in your stool or on the toilet paper. If you underwent a bowel prep for your procedure, you may not have a normal bowel movement for a few days.  Please Note:  You might notice some irritation and congestion in your nose or some drainage.  This is from the oxygen used during your procedure.  There is no need for concern and it should clear up in a day or so.  SYMPTOMS TO REPORT IMMEDIATELY:    Following upper endoscopy (EGD)  Vomiting of blood or coffee ground material  New chest pain or pain under the shoulder blades  Painful or persistently difficult swallowing  New shortness of breath  Fever of 100F or higher  Black, tarry-looking stools  For urgent or emergent issues, a gastroenterologist can be reached at any hour by calling [phone].   DIET:  POST-DILATION DIET.   Drink plenty of fluids but you should avoid alcoholic beverages for 24 hours.  ACTIVITY:  You should plan to take it easy for the rest of today  and you should NOT DRIVE or use heavy machinery until tomorrow (because of the sedation medicines used during the test).    FOLLOW UP: Our staff will call the number listed on your records 48-72 hours following your procedure to check on you and address any questions or concerns that you may have regarding the information given to you following your procedure. If we do not reach you, we will leave a message.  We will attempt to reach you two times.  During this call, we will ask if you have developed any symptoms of COVID 19. If you develop any symptoms (ie: fever, flu-like symptoms, shortness of breath, cough etc.) before then, please call [phone].  If you test positive for Covid 19 in the 2 weeks post procedure, please call and report this information to [place].    If any biopsies were taken you will be contacted by phone or by letter within the next 1-3 weeks.  Please call us at [phone] if you have not heard about the biopsies in 3 weeks.    SIGNATURES/CONFIDENTIALITY: You and/or your care partner have signed paperwork which will be entered into your electronic medical record.  These signatures attest to the fact that that the information above on your After Visit Summary has been reviewed and is understood.  Full responsibility of the confidentiality of this discharge information lies with you and/or your care-partner.

## 2019-12-18 NOTE — Op Note (Signed)
[facility] Patient Name: Naftoli Penny Procedure Date: 12/18/2019 9:56 AM MRN: [id number] Endoscopist: Jackquline Denmark , MD Age: 59 Referring MD:  Date of Birth: 04-Oct-1961 Gender: Male Account #: [id number] Procedure:                Upper GI endoscopy Indications:              Dysphagia with known history of esophageal                            stricture s/p esophageal dilatations. Previous                            steroid injection Medicines:                Monitored Anesthesia Care Procedure:                Pre-Anesthesia Assessment:                           - Prior to the procedure, a History and Physical                            was performed, and patient medications and                            allergies were reviewed. The patient's tolerance of                            previous anesthesia was also reviewed. The risks                            and benefits of the procedure and the sedation                            options and risks were discussed with the patient.                            All questions were answered, and informed consent                            was obtained. Prior Anticoagulants: The patient has                            taken no previous anticoagulant or antiplatelet                            agents. ASA Grade Assessment: III - A patient with                            severe systemic disease. After reviewing the risks                            and benefits, the patient was deemed in  satisfactory condition to undergo the procedure.                           After obtaining informed consent, the endoscope was                            passed under direct vision. Throughout the                            procedure, the patient's blood pressure, pulse, and                            oxygen saturations were monitored continuously. The                            Endoscope was introduced through the mouth,  and                            advanced to the second part of duodenum. The upper                            GI endoscopy was accomplished without difficulty.                            The patient tolerated the procedure well. Scope In: Scope Out: Findings:                 One benign-appearing, intrinsic severe (stenosis;                            an endoscope cannot pass) stenosis was found, 35 cm                            from incisors. This stenosis measured 1 cm (inner                            diameter). A small diverticulum was noted just                            proximal to the stricture. The esophagus was mildly                            tortuous and minimally dilated. The stenosis was                            traversed after dilation. A TTS dilator was passed                            through the scope. Dilation with an 09-16-12 mm,                            followed by 13.5-14.5-15.5 balloon dilator was  performed to max of 14.5 mm x 1 minute each.                            Estimated blood loss was minimal.                           A 4 cm hiatal hernia was present. A suture was                            noted along the lesser curvature. The gastric                            mucosa otherwise was normal.                           The examined duodenum was normal. Complications:            No immediate complications. Estimated Blood Loss:     Estimated blood loss: none. Impression:               - Benign-appearing esophageal stenosis. Dilated.                           - 4 cm hiatal hernia.                           - Normal examined duodenum.                           - No specimens collected. Recommendation:           - Patient has a contact number available for                            emergencies. The signs and symptoms of potential                            delayed complications were discussed with the                             patient. Return to normal activities tomorrow.                            Written discharge instructions were provided to the                            patient.                           - Resume previous diet.                           - Continue present medications.                           - No aspirin, ibuprofen, naproxen, or other  non-steroidal anti-inflammatory drugs.                           - Return to GI clinic in 12 weeks. Lynann Bologna, MD 12/18/2019 10:31:59 AM This report has been signed electronically.

## 2019-12-18 NOTE — Progress Notes (Signed)
A/ox3, pleased with MAC, report to RN 

## 2019-12-18 NOTE — Progress Notes (Signed)
Pt's states no medical or surgical changes since previsit or office visit.   Temp-jb  V/s-dt 

## 2019-12-22 ENCOUNTER — Telehealth: Payer: Self-pay

## 2019-12-22 NOTE — Telephone Encounter (Signed)
Left message on follow up call. 

## 2019-12-22 NOTE — Telephone Encounter (Signed)
  Follow up Call-  Call back number 12/18/2019 09/11/2019 07/10/2019 04/16/2019  Post procedure Call Back phone  # [phone]-sister [phone]  Permission to leave phone message Yes Yes Yes Yes     Patient questions:  Do you have a fever, pain , or abdominal swelling? No. Pain Score  0 *  Have you tolerated food without any problems? Yes.    Have you been able to return to your normal activities? Yes.    Do you have any questions about your discharge instructions: Diet   No. Medications  No. Follow up visit  No.  Do you have questions or concerns about your Care? No.  Actions: * If pain score is 4 or above: No action needed, pain <4.  1. Have you developed a fever since your procedure? no  2.   Have you had an respiratory symptoms (SOB or cough) since your procedure? no  3.   Have you tested positive for COVID 19 since your procedure no  4.   Have you had any family members/close contacts diagnosed with the COVID 19 since your procedure?  no   If yes to any of these questions please route to Laverna Peace, RN and Jennye Boroughs, Charity fundraiser.

## 2020-01-06 NOTE — Progress Notes (Signed)
I have reviewed and agreed above plan. 

## 2020-03-29 ENCOUNTER — Encounter: Payer: Self-pay | Admitting: Gastroenterology

## 2020-03-29 ENCOUNTER — Ambulatory Visit (INDEPENDENT_AMBULATORY_CARE_PROVIDER_SITE_OTHER): Payer: Medicaid Other | Admitting: Gastroenterology

## 2020-03-29 VITALS — BP 110/58 | HR 73 | Temp 97.8°F | Ht 66.5 in | Wt 151.5 lb

## 2020-03-29 DIAGNOSIS — R1310 Dysphagia, unspecified: Secondary | ICD-10-CM | POA: Diagnosis not present

## 2020-03-29 DIAGNOSIS — K7460 Unspecified cirrhosis of liver: Secondary | ICD-10-CM

## 2020-03-29 NOTE — Progress Notes (Signed)
Chief Complaint: FU  Referring Provider:  Earlyne Iba, NP      ASSESSMENT AND PLAN;   #1. GERD with H/O HH, severe recurrent eso stricture s/p frequent dilatations 11/2016, 07/10/2019 09/2019 (with steroid injection), 12/2019  #2. ETOH liver cirrhosis with H/O ascites.  Mild HE with sl elevated ammonia. [place] 03/2028- mild cirrhosis, no ascites or splenomegaly.  Normal flow on Doppler. Nl AFP 03/2019. Alb 4.3 (03/2019)  #3. H/O UGI bleeding d/t erosive esophagitis s/p clipping and endoscopy x3 with severe coagulopathy/alcohol abuse (INR>9)and hypovolemic/septic shock s/p 10U PRBC. No Varices. (Aug 2019 @ [place])  #4. Chronic ETOH pancreatitis (quit ETOH Jun 30, 2018, when he moved to [facility]). MRI/MRCP 09/2018 -acute on chronic pancreatitis.  Possibly pancreas divisum.  Diffusely mildly dilated pancreatic duct. No PD stones/ masses. 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  No exocrine or endocrine pancreatic insufficiency clinically. H/O pancreatic pseudocyst s/p endoscopic drainage in past. Nl CA19-9, CEA 03/2019  #5.  H/O seizures (? ETOH withdrawal)  #6.  H/O multi-substance abuse (cocaine, marijuana, tobacco).  Quit since he moved to [facility].  Under strict care of his sister [name].  #7.  Comorbid conditions- SZ, anxiety/depression, family history of von Willebrand disease   Plan: -Continue protonix 57m po bid. -Rpt EGD with kenalog injection June 2021 at [facility]. [facility] [facility]. -Salt and fluid restricted diet.  -Continue low dose lasix and spironolactone. -CBC, CMP, AFP, PT INR @ time of EGD -Continue rest of the medications -I have instructed patient that he needs to chew foods especially meats and breads well and eat slowly.   HPI:    VTerel Coffey a 59y.o. male   For follow-up Doing very well Still having problems swallowing at times. Would like to get repeat EGD with dilatation.  The EGD with dilatation with  steroids helped the most.   Has been compliant with his medications.  No nonsteroidals.  He has also been compliant with salt restriction.  No further alcohol.  Of note - has quit drinking all alcohol, quit all drugs, quit smoking since May 2019.  He was very sick in May with upper GI bleeding/alcohol withdrawal.  [name]   On lactulose 1/day with 1-2 softer BM/day. Rifaxamin not started as it was not approved.   Review of past medical records: -H/O UGI bleeding d/t erosive esophagitis s/p clipping and endoscopy x3 with severe coagulopathy/alcohol abuse (INR>9)and hypovolemic/septic shock s/p 10U PRBC. No Varices. (Aug 2019   [place] except for mod sigmoid diverticulosis (report in care everywhere).  No need for 10 years.  Past Medical History:  Diagnosis Date  . Acute on chronic pancreatitis (HCC)    hx of pancreatitis; nothing recently til today /notes 09/17/2018 (09/17/2018)  . Acute stomach ulcer 07/2018  . Alcohol dependence with withdrawal ([place] 035/46/5681  w/complications  . Alcohol withdrawal ([place] 09/19/2016  . Alcohol-induced chronic pancreatitis ([place] 09/26/2014; 09/19/2016  . Alcoholic gastritis 127/51/7001 . Alcoholic liver disease ([facility] 07/21/2018  . Altered mental status 05/06/2013  . Anemia due to acute blood loss 07/2018  . Anxiety attack   . Ascites due to alcoholic hepatitis 074/94/4967 . Chest pain 11/09/2013   "have had chest pains several times" (09/17/2018)  . Chronic alcoholism ([place]    /Archie Endo11/13/2019  . Coagulopathy ([facility] unknown  . Family history of von Willebrand disease    "  sister, [name]" (09/17/2018)  . Gastrointestinal hemorrhage with melena unknown  . GERD (gastroesophageal reflux disease)   . Headache    "weekly" (09/17/2018)  . History of blood transfusion 06/2018   "10 units in 8 h; probably had some before" (09/17/2018)  . History of cirrhosis of liver    /notes  09/17/2018  . History of hiatal hernia   . History of shingles   . Hypertension   . Hypokalemia 09/19/2016  . Hypomagnesemia 09/19/2016  . Irregular heart rate 07/2018  . Pancreatic mass    pancreatic masses/notes 09/17/2018  . 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 Protein malnutrition ([place]) 07/02/2018   chronic  . Scrotal edema 07/21/2018  . Seizure ([place]) last 2018?   "since ~ 2002; several; controlled w/RX" (09/17/2018)/over 2 years /pt is weaning off meds  . Shingles 10/2019  . Subconjunctival hemorrhage of right eye 06/14/2016  . Substance abuse ([place])   . Tremor 09/26/2014    Past Surgical History:  Procedure Laterality Date  . COLONOSCOPY    . ESOPHAGEAL VARICE LIGATION  06/2018  . ESOPHAGOGASTRODUODENOSCOPY  04/2019  . HEMORRHOID SURGERY    . INGUINAL HERNIA REPAIR Right X 2  . PANCREAS SURGERY     "cyst"  . UPPER GASTROINTESTINAL ENDOSCOPY      Family History  Problem Relation Age of Onset  . Stroke Mother   . Congestive Heart Failure Mother   . Other Father        unsure of history   . Alcohol abuse Maternal Uncle   . Colon cancer Neg Hx   . Esophageal cancer Neg Hx   . Rectal cancer Neg Hx   . Stomach cancer Neg Hx   . Colon polyps Neg Hx     Social History   Tobacco Use  . Smoking status: Former Smoker    Packs/day: 0.33    Years: 45.00    Pack years: 14.85    Types: Cigarettes    Quit date: 2019    Years since quitting: 2.3  . Smokeless tobacco: Never Used  . Tobacco comment: tried smokeless 1 or 2 times but it wasn't his thing per patient  Substance Use Topics  . Alcohol use: Not Currently    Comment:  "nothing since 06/30/2018"  . Drug use: Not Currently    Types: Marijuana    Comment: "I've done everything but heroin; last drug use was marijuana in ~ 03/2018"     Current Outpatient Medications  Medication Sig Dispense Refill  . acetaminophen (TYLENOL) 500 MG tablet Take 1 tablet (500 mg total) by mouth every 6 (six) hours as needed for  mild pain or moderate pain. 30 tablet   . Cholecalciferol (VITAMIN D3 PO) Take 1,000 Units by mouth once a week.     . Cyclobenzaprine HCl (FLEXERIL PO) Take 1 tablet by mouth every 12 (twelve) hours.    . folic acid (FOLVITE) 1 MG tablet Take 1 tablet (1 mg total) by mouth daily. 90 tablet 1  . furosemide (LASIX) 20 MG tablet Take 1 tablet (20 mg total) by mouth daily. 90 tablet 1  . gabapentin (NEURONTIN) 300 MG capsule Take 1 capsule (300 mg total) by mouth 2 (two) times daily. 60 capsule 11  . lactulose (CHRONULAC) 10 GM/15ML solution Take 15 mLs (10 g total) by mouth daily. 1892 mL 2  . levETIRAcetam (KEPPRA) 500 MG tablet Take 1 tablet (500 mg total) by mouth 2 (two) times daily. 180 tablet 4  . metoprolol tartrate (  LOPRESSOR) 25 MG tablet Take 1 tablet (25 mg total) by mouth 2 (two) times daily. 180 tablet 1  . pantoprazole (PROTONIX) 40 MG tablet TAKE ONE TABLET BY MOUTH TWICE DAILY BEFORE MEALS 60 tablet 3  . predniSONE (STERAPRED UNI-PAK 21 TAB) 10 MG (21) TBPK tablet Take 10 mg by mouth as directed.    Marland Kitchen spironolactone (ALDACTONE) 25 MG tablet Take 1 tablet (25 mg total) by mouth daily. 90 tablet 1  . therapeutic multivitamin-minerals (THERAGRAN-M) tablet Take 1 tablet by mouth daily. 30 tablet 6  . thiamine 100 MG tablet Take 1 tablet (100 mg total) by mouth daily. 30 tablet 6   No current facility-administered medications for this visit.    Allergies  Allergen Reactions  . Morphine And Related Other (See Comments)    Unknown per pt, states he was out of it.   Marland Kitchen Peach [Prunus Persica] Hives    Fresh peaches    Review of Systems:       Physical Exam:    BP (!) 110/58   Pulse 73   Temp 97.8 F (36.6 C)   Ht 5' 6.5" (1.689 m)   Wt 151 lb 8 oz (68.7 kg)   BMI 24.09 kg/m  Filed Weights   03/29/20 0854  Weight: 151 lb 8 oz (68.7 kg)   Gen: awake, alert, NAD HEENT: anicteric, no pallor CV: RRR, no mrg Pulm: CTA b/l Abd: soft, NT/ND, +BS throughout Ext: no  c/c/e Neuro: nonfocal  Data Reviewed: I have personally reviewed following labs and imaging studies  CBC: CBC Latest Ref Rng & Units 04/01/2019 10/06/2018 09/20/2018  WBC 4.0 - 10.5 K/uL 6.9 9.7 4.8  Hemoglobin 13.0 - 17.0 g/dL 13.0 12.9(L) 10.9(L)  Hematocrit 39.0 - 52.0 % 37.9(L) 38.0 33.0(L)  Platelets 150.0 - 400.0 K/uL 203.0 265 126(L)    CMP: CMP Latest Ref Rng & Units 04/01/2019 10/06/2018 09/20/2018  Glucose 70 - 99 mg/dL 82 61(L) 98  BUN 6 - 23 mg/dL 13 12 <5(L)  Creatinine 0.40 - 1.50 mg/dL 0.85 0.58(L) 0.61  Sodium 135 - 145 mEq/L 137 137 136  Potassium 3.5 - 5.1 mEq/L 3.9 4.2 4.6  Chloride 96 - 112 mEq/L 102 97 105  CO2 19 - 32 mEq/L '26 22 26  ' Calcium 8.4 - 10.5 mg/dL 9.2 9.3 8.4(L)  Total Protein 6.0 - 8.3 g/dL 7.6 7.2 6.0(L)  Total Bilirubin 0.2 - 1.2 mg/dL 0.4 0.3 0.6  Alkaline Phos 39 - 117 U/L 97 116 98  AST 0 - 37 U/L 29 33 31  ALT 0 - 53 U/L '31 20 18   ' Hepatic Function Latest Ref Rng & Units 04/01/2019 10/06/2018 09/20/2018  Total Protein 6.0 - 8.3 g/dL 7.6 7.2 6.0(L)  Albumin 3.5 - 5.2 g/dL 4.3 3.7 2.6(L)  AST 0 - 37 U/L 29 33 31  ALT 0 - 53 U/L '31 20 18  ' Alk Phosphatase 39 - 117 U/L 97 116 98  Total Bilirubin 0.2 - 1.2 mg/dL 0.4 0.3 0.6  D/W patient's sister Levert Feinstein, MD 03/29/2020, 9:15 AM  Cc: Earlyne Iba, NP

## 2020-03-29 NOTE — Patient Instructions (Addendum)
If you are age 59 or older, your body mass index should be between 23-30. Your Body mass index is 24.09 kg/m. If this is out of the aforementioned range listed, please consider follow up with your Primary Care Provider.  If you are age 25 or younger, your body mass index should be between 19-25. Your Body mass index is 24.09 kg/m. If this is out of the aformentioned range listed, please consider follow up with your Primary Care Provider.   You have been scheduled for an endoscopy. Please follow written instructions given to you at your visit today. If you use inhalers (even only as needed), please bring them with you on the day of your procedure. Your physician has requested that you go to www.startemmi.com and enter the access code given to you at your visit today. This web site gives a general overview about your procedure. However, you should still follow specific instructions given to you by our office regarding your preparation for the procedure.  Your provider has requested that you go to the basement level for lab at [place]. [place]. Press "B" on the elevator. The lab is located at the first door on the left as you exit the elevator.  Continue Protonix continue twice daily.  Continue low dose Lasix and Spironolactone.  You have been given a low sodium and fluid restriction diet. See below.  Continue rest of medications.  Thank you for choosing me and [facility].  Jackquline Denmark, MD   Fluid Restriction With some health conditions, you must restrict your fluid intake. This means that you need to limit the amount of fluid that you drink each day (fluid restriction). When you have a fluid restriction, you must carefully measure and keep track of the amount of fluid that you drink. Your health care provider will identify the specific amount of fluid you are allowed each day (fluid allowance). This amount may depend on several things, such as:  How well  your kidneys function.  How much fluid you are keeping (retaining) in your body tissues.  Your blood pressure.  Your heart function.  Your blood sodium level. What is my plan? Your health care provider recommends that you limit your fluid intake to __________ per day. What counts toward my fluid intake? Your fluid intake includes all liquids that you drink, as well as any foods that become liquid at room temperature. The following are examples of some fluids that you will have to restrict:  Tea, coffee, soda, lemonade, milk, water, juice, sports drinks, and nutritional supplement beverages.  Alcoholic beverages.  Cream.  Gravy.  Ice cubes.  Soup and broth. The following are examples of foods that become liquid at room temperature. These foods will also count toward your fluid intake.  Ice cream and ice milk.  Frozen yogurt and sherbet.  Frozen ice pops.  Flavored gelatin. How do I keep track of my fluid intake? Each morning, fill a jug with the amount of water that is equal to your daily fluid allowance. You can use this water as a guideline for fluid allowance. Each time you take in any form of fluid (including ice cubes and foods that become liquid at room temperature), pour an equal amount of water out of the container. This helps you to see how much fluid you are taking in. It also helps you to see how much more fluid you can take in during the rest of the day. The following conversions may also be  helpful in measuring your fluid intake:  1 cup equals 8 oz (240 mL).   cup equals 6 oz (180 mL).  ? cup equals 5? oz (160 mL).   cup equals 4 oz (120 mL).  ? cup equals 2? oz (80 mL).   cup equals 2 oz (60 mL).  2 Tbsp equals 1 oz (30 mL). What are tips for following this plan? General instructions  Make sure that you stay within your recommended fluid allowance each day. Always measure and keep track of your fluids (including ice cubes and foods that become  liquid at room temperature).  Use small cups and glasses and learn to sip fluids slowly.  Try frozen fruits between meals, such as grapes or strawberries. These can satisfy thirst without adding to your fluid intake.  Swallow your pills along with meals or soft foods such as applesauce or mashed potatoes, instead of with liquids. Doing this helps you to save your fluid allowance for something that you enjoy. Weigh yourself each day     Weigh yourself every day. Keeping track of your daily weight can help you and your health care provider to notice as soon as possible if you are retaining too much fluid in your body.  Follow this sequence every morning: 1. Urinate. 2. Weigh yourself. 3. Eat breakfast.  Wear the same amount of clothing each time you weigh yourself.  Write down your daily weight. Give this weight record to your health care provider. If your weight is going up, you may be retaining too much fluid. Every 1 lb (0.45 kg) of body weight that you gain is a sign that your body is retaining 2 cups (480 mL) of fluid.  Manage your thirst  Add lemon juice or a slice of fresh lemon to water or ice. Doing this helps to satisfy your thirst.  Freeze fruit juice or water in an ice cube tray. Use this as part of your fluid allowance. These cubes are useful for quenching your thirst. Before you freeze the juice or water, measure how much liquid you use to fill a cube section of the ice tray. Subtract this amount from your day's allowance each time you consume a frozen cube.  Avoid salty (high-sodium) foods. These foods make you thirsty and make it more difficult to stay within your daily fluid allowance.  Keep the temperature in your home at a cooler level.  Keep the air in your home as humid as possible. Dry air increases thirst.  Avoid being out in the hot sun, which can cause you to sweat and become thirsty.  To help avoid dry mouth, brush your teeth often or rinse out your mouth  with mouthwash. Lemon wedges, hard sour candies, chewing gum, or breath spray may also help to moisten your mouth. What are some signs that I may be taking in too much fluid? You may be taking in too much fluid if:  Your weight increases. Contact your health care provider if you gain weight rapidly.  Your face, hands, legs, feet, and abdomen start to swell.  You have trouble breathing. Summary  With some health conditions, you must limit (restrict) your fluid intake. This means that you need to limit the amount of fluid you drink each day (fluid restriction). Your health care provider will identify the specific amount of fluid that you are allowed each day.  When you have a fluid restriction, you must carefully measure and keep track of the amount of fluid that you  drink.  Your fluid intake includes all liquids that you drink, as well as any foods that become liquid at room temperature (such as ice cream and gelatin).  You may be taking in too much fluid if your weight increases, your body starts to swell, or you have trouble breathing. This information is not intended to replace advice given to you by your health care provider. Make sure you discuss any questions you have with your health care provider. Document Revised: 02/12/2019 Document Reviewed: 06/26/2017 Elsevier Patient Education  2020 Else[employer] Inc.  Low-Sodium Eating Plan Sodium, which is an element that makes up salt, helps you maintain a healthy balance of fluids in your body. Too much sodium can increase your blood pressure and cause fluid and waste to be held in your body. Your health care provider or dietitian may recommend following this plan if you have high blood pressure (hypertension), kidney disease, liver disease, or heart failure. Eating less sodium can help lower your blood pressure, reduce swelling, and protect your heart, liver, and kidneys. What are tips for following this plan? General guidelines  Most people  on this plan should limit their sodium intake to 1,500-2,000 mg (milligrams) of sodium each day. Reading food labels   The Nutrition Facts label lists the amount of sodium in one serving of the food. If you eat more than one serving, you must multiply the listed amount of sodium by the number of servings.  Choose foods with less than 140 mg of sodium per serving.  Avoid foods with 300 mg of sodium or more per serving. Shopping  Look for lower-sodium products, often labeled as "low-sodium" or "no salt added."  Always check the sodium content even if foods are labeled as "unsalted" or "no salt added".  Buy fresh foods. ? Avoid canned foods and premade or frozen meals. ? Avoid canned, cured, or processed meats  Buy breads that have less than 80 mg of sodium per slice. Cooking  Eat more home-cooked food and less restaurant, buffet, and fast food.  Avoid adding salt when cooking. Use salt-free seasonings or herbs instead of table salt or sea salt. Check with your health care provider or pharmacist before using salt substitutes.  Cook with plant-based oils, such as canola, sunflower, or olive oil. Meal planning  When eating at a restaurant, ask that your food be prepared with less salt or no salt, if possible.  Avoid foods that contain MSG (monosodium glutamate). MSG is sometimes added to [place] food, bouillon, and some canned foods. What foods are recommended? The items listed may not be a complete list. Talk with your dietitian about what dietary choices are best for you. Grains Low-sodium cereals, including oats, puffed wheat and rice, and shredded wheat. Low-sodium crackers. Unsalted rice. Unsalted pasta. Low-sodium bread. Whole-grain breads and whole-grain pasta. Vegetables Fresh or frozen vegetables. "No salt added" canned vegetables. "No salt added" tomato sauce and paste. Low-sodium or reduced-sodium tomato and vegetable juice. Fruits Fresh, frozen, or canned fruit. Fruit  juice. Meats and other protein foods Fresh or frozen (no salt added) meat, poultry, seafood, and fish. Low-sodium canned tuna and salmon. Unsalted nuts. Dried peas, beans, and lentils without added salt. Unsalted canned beans. Eggs. Unsalted nut butters. Dairy Milk. Soy milk. Cheese that is naturally low in sodium, such as ricotta cheese, fresh mozzarella, or Swiss cheese Low-sodium or reduced-sodium cheese. Cream cheese. Yogurt. Fats and oils Unsalted butter. Unsalted margarine with no trans fat. Vegetable oils such as canola or olive oils.  Seasonings and other foods Fresh and dried herbs and spices. Salt-free seasonings. Low-sodium mustard and ketchup. Sodium-free salad dressing. Sodium-free light mayonnaise. Fresh or refrigerated horseradish. Lemon juice. Vinegar. Homemade, reduced-sodium, or low-sodium soups. Unsalted popcorn and pretzels. Low-salt or salt-free chips. What foods are not recommended? The items listed may not be a complete list. Talk with your dietitian about what dietary choices are best for you. Grains Instant hot cereals. Bread stuffing, pancake, and biscuit mixes. Croutons. Seasoned rice or pasta mixes. Noodle soup cups. Boxed or frozen macaroni and cheese. Regular salted crackers. Self-rising flour. Vegetables Sauerkraut, pickled vegetables, and relishes. Olives. [place] fries. Onion rings. Regular canned vegetables (not low-sodium or reduced-sodium). Regular canned tomato sauce and paste (not low-sodium or reduced-sodium). Regular tomato and vegetable juice (not low-sodium or reduced-sodium). Frozen vegetables in sauces. Meats and other protein foods Meat or fish that is salted, canned, smoked, spiced, or pickled. Bacon, ham, sausage, hotdogs, corned beef, chipped beef, packaged lunch meats, salt pork, jerky, pickled herring, anchovies, regular canned tuna, sardines, salted nuts. Dairy Processed cheese and cheese spreads. Cheese curds. Blue cheese. Feta cheese. String  cheese. Regular cottage cheese. Buttermilk. Canned milk. Fats and oils Salted butter. Regular margarine. Ghee. Bacon fat. Seasonings and other foods Onion salt, garlic salt, seasoned salt, table salt, and sea salt. Canned and packaged gravies. Worcestershire sauce. Tartar sauce. Barbecue sauce. Teriyaki sauce. Soy sauce, including reduced-sodium. Steak sauce. Fish sauce. Oyster sauce. Cocktail sauce. Horseradish that you find on the shelf. Regular ketchup and mustard. Meat flavorings and tenderizers. Bouillon cubes. Hot sauce and Tabasco sauce. Premade or packaged marinades. Premade or packaged taco seasonings. Relishes. Regular salad dressings. Salsa. Potato and tortilla chips. Corn chips and puffs. Salted popcorn and pretzels. Canned or dried soups. Pizza. Frozen entrees and pot pies. Summary  Eating less sodium can help lower your blood pressure, reduce swelling, and protect your heart, liver, and kidneys.  Most people on this plan should limit their sodium intake to 1,500-2,000 mg (milligrams) of sodium each day.  Canned, boxed, and frozen foods are high in sodium. Restaurant foods, fast foods, and pizza are also very high in sodium. You also get sodium by adding salt to food.  Try to cook at home, eat more fresh fruits and vegetables, and eat less fast food, canned, processed, or prepared foods. This information is not intended to replace advice given to you by your health care provider. Make sure you discuss any questions you have with your health care provider. Document Revised: 10/04/2017 Document Reviewed: 10/15/2016 Elsevier Patient Education  2020 [employer].

## 2020-04-28 ENCOUNTER — Telehealth: Payer: Self-pay | Admitting: *Deleted

## 2020-04-28 NOTE — Telephone Encounter (Signed)
Dr. Chales Abrahams and Malachi Bonds,  This pt is scheduled for an EGD with Kenalog injection 04-29-20.  He was seen in the office 03-29-20.  Camillo Flaming, who orders our medications, was never notified of the need for Kenalog.  We special order this as needed, such as with this pt.  We do not keep Kenalog on hand, and will not be able to get the medication at this late time.    He will need to be called and rescheduled  We will need to know when he is rescheduled for so we can make sure to have the Kenalog available for him.    Thank you, Baxter Hire

## 2020-04-28 NOTE — Telephone Encounter (Signed)
I spoke with Karlton Lemon and Camillo Flaming- ok to bring Kenalog from office, but Dr. Chales Abrahams must bring it.  Understanding voiced.

## 2020-04-29 ENCOUNTER — Encounter: Payer: Self-pay | Admitting: Gastroenterology

## 2020-04-29 ENCOUNTER — Ambulatory Visit (AMBULATORY_SURGERY_CENTER): Payer: Medicaid Other | Admitting: Gastroenterology

## 2020-04-29 ENCOUNTER — Other Ambulatory Visit: Payer: Self-pay

## 2020-04-29 VITALS — BP 116/76 | HR 54 | Temp 96.3°F | Resp 16 | Ht 66.5 in | Wt 151.8 lb

## 2020-04-29 DIAGNOSIS — R1310 Dysphagia, unspecified: Secondary | ICD-10-CM

## 2020-04-29 DIAGNOSIS — K222 Esophageal obstruction: Secondary | ICD-10-CM

## 2020-04-29 DIAGNOSIS — K449 Diaphragmatic hernia without obstruction or gangrene: Secondary | ICD-10-CM | POA: Diagnosis not present

## 2020-04-29 MED ORDER — SODIUM CHLORIDE 0.9 % IV SOLN: 500.0000 mL | Freq: Once | INTRAVENOUS | Status: DC

## 2020-04-29 NOTE — Progress Notes (Signed)
Called to room to assist during endoscopic procedure.  Patient ID and intended procedure confirmed with present staff. Received instructions for my participation in the procedure from the performing physician.     Triamcinolone 1cc diluted with 1cc of normal saline 10mg  injected in 4 quadrants of esophagus.

## 2020-04-29 NOTE — Op Note (Signed)
[facility] Patient Name: Todd Coffey Procedure Date: 04/29/2020 10:00 AM MRN: [id number] Endoscopist: Lynann Bologna , MD Age: 59 Referring MD:  Date of Birth: [birth year] Gender: Male Account #: [id number] Procedure:                Upper GI endoscopy Indications:              Dysphagia in a patient with known refractory                            esophageal stricture s/p multiple esophageal                            dilatations. Medicines:                Monitored Anesthesia Care Procedure:                Pre-Anesthesia Assessment:                           - Prior to the procedure, a History and Physical                            was performed, and patient medications and                            allergies were reviewed. The patient's tolerance of                            previous anesthesia was also reviewed. The risks                            and benefits of the procedure and the sedation                            options and risks were discussed with the patient.                            All questions were answered, and informed consent                            was obtained. Prior Anticoagulants: The patient has                            taken no previous anticoagulant or antiplatelet                            agents. ASA Grade Assessment: III - A patient with                            severe systemic disease. After reviewing the risks                            and benefits, the patient was deemed in  satisfactory condition to undergo the procedure.                           After obtaining informed consent, the endoscope was                            passed under direct vision. Throughout the                            procedure, the patient's blood pressure, pulse, and                            oxygen saturations were monitored continuously. The                            Endoscope was introduced through the mouth, and                             advanced to the second part of duodenum. The upper                            GI endoscopy was accomplished without difficulty.                            The patient tolerated the procedure well. Scope In: Scope Out: Findings:                 One benign-appearing, intrinsic moderate                            (circumferential scarring or stenosis; an endoscope                            may pass) stenosis was found 35 cm from the                            incisors. This stenosis measured 1 cm (inner                            diameter). The stenosis was traversed. A TTS                            dilator was passed through the scope. Dilation with                            a 13.5-14.5-15.5 mm balloon dilator was performed                            to 14.5 mm x 1 minute. The dilation site was                            examined and showed mild mucosal disruption and  moderate improvement in luminal narrowing. Area was                            successfully injected with 0.5 mL of triamcinolone                            (10 mg/ 0.49mL) in all 4 quadrants including through                            mucosal rent. Estimated blood loss: none.                           A 4 cm hiatal hernia was present.                           The exam was otherwise without abnormality. Complications:            No immediate complications. Estimated Blood Loss:     Estimated blood loss: none. Impression:               - Benign-appearing esophageal stenosis s/p                            dilatation and four-quadrant triamcinolone                            injection.                           - 4 cm hiatal hernia.                           - The examination was otherwise normal.                           - No specimens collected. Recommendation:           - Patient has a contact number available for                            emergencies. The signs and  symptoms of potential                            delayed complications were discussed with the                            patient. Return to normal activities tomorrow.                            Written discharge instructions were provided to the                            patient.                           - Postdilatation diet.                           -  Continue present medications.                           - Return to GI clinic in 6 months.                           - The findings and recommendations were discussed                            with the patient's family. Lynann Bologna, MD 04/29/2020 10:30:19 AM This report has been signed electronically.

## 2020-04-29 NOTE — Patient Instructions (Signed)

## 2020-04-29 NOTE — Progress Notes (Signed)
Report to PACU, RN, vss, BBS= Clear.  

## 2020-05-03 ENCOUNTER — Telehealth: Payer: Self-pay

## 2020-05-03 NOTE — Telephone Encounter (Signed)
  Follow up Call-  Call back number 04/29/2020 12/18/2019 09/11/2019 07/10/2019 04/16/2019  Post procedure Call Back phone  # [phone] (Pt.'s sister [name].  Pt. lives with sister) [phone]  Permission to leave phone message Yes Yes Yes Yes Yes     Patient questions:  Do you have a fever, pain , or abdominal swelling? No. Pain Score  0 *  Have you tolerated food without any problems? Yes.    Have you been able to return to your normal activities? Yes.    Do you have any questions about your discharge instructions: Diet   No. Medications  No. Follow up visit  No.  Do you have questions or concerns about your Care? No.  Actions: * If pain score is 4 or above: No action needed, pain <4.  1. Have you developed a fever since your procedure? no  2.   Have you had an respiratory symptoms (SOB or cough) since your procedure? no  3.   Have you tested positive for COVID 19 since your procedure no  4.   Have you had any family members/close contacts diagnosed with the COVID 19 since your procedure?  no   If yes to any of these questions please route to Laverna Peace, RN and Charlett Lango, RN

## 2020-05-06 ENCOUNTER — Telehealth: Payer: Self-pay | Admitting: Gastroenterology

## 2020-05-06 ENCOUNTER — Other Ambulatory Visit: Payer: Self-pay

## 2020-05-06 DIAGNOSIS — K7460 Unspecified cirrhosis of liver: Secondary | ICD-10-CM

## 2020-05-06 DIAGNOSIS — R1310 Dysphagia, unspecified: Secondary | ICD-10-CM

## 2020-05-06 DIAGNOSIS — Z8719 Personal history of other diseases of the digestive system: Secondary | ICD-10-CM

## 2020-05-06 DIAGNOSIS — K7030 Alcoholic cirrhosis of liver without ascites: Secondary | ICD-10-CM

## 2020-05-06 MED ORDER — LACTULOSE 10 GM/15ML PO SOLN: 10.0000 g | Freq: Every day | ORAL | 2 refills | Status: AC

## 2020-05-06 NOTE — Telephone Encounter (Signed)
Pt needs rf for lactulose. Pls send it to Urgent [facility].

## 2020-06-14 ENCOUNTER — Ambulatory Visit: Payer: Medicaid Other | Admitting: Neurology

## 2020-06-28 ENCOUNTER — Telehealth: Payer: Self-pay | Admitting: Neurology

## 2020-06-28 NOTE — Telephone Encounter (Signed)
I received blood work sent from his primary doctor collected on 06/22/2020, CBC was unremarkable, hemoglobin 13.8, CMP glucose 107, creatinine 0.80, normal AST/ALT, LDL 90, total cholesterol 597, HDL 42, INR 1.1, vitamin D 42.1.

## 2020-06-29 ENCOUNTER — Telehealth: Payer: Self-pay | Admitting: Neurology

## 2020-06-29 NOTE — Telephone Encounter (Signed)
Has seizures.

## 2020-06-29 NOTE — Telephone Encounter (Signed)
..   Pt understands that although there may be some limitations with this type of visit, we will take all precautions to reduce any security or privacy concerns.  Pt understands that this will be treated like an in office visit and we will file with pt's insurance, and there may be a patient responsible charge related to this service. ? ?

## 2020-06-30 ENCOUNTER — Encounter: Payer: Self-pay | Admitting: Neurology

## 2020-06-30 ENCOUNTER — Telehealth (INDEPENDENT_AMBULATORY_CARE_PROVIDER_SITE_OTHER): Payer: Medicaid Other | Admitting: Neurology

## 2020-06-30 DIAGNOSIS — G40909 Epilepsy, unspecified, not intractable, without status epilepticus: Secondary | ICD-10-CM | POA: Diagnosis not present

## 2020-06-30 DIAGNOSIS — R569 Unspecified convulsions: Secondary | ICD-10-CM

## 2020-06-30 MED ORDER — GABAPENTIN 300 MG PO CAPS: 300.0000 mg | ORAL_CAPSULE | Freq: Two times a day (BID) | ORAL | 11 refills | Status: AC

## 2020-06-30 MED ORDER — LEVETIRACETAM 500 MG PO TABS: 500.0000 mg | ORAL_TABLET | Freq: Two times a day (BID) | ORAL | 4 refills | Status: DC

## 2020-06-30 NOTE — Progress Notes (Signed)
Virtual Visit via Video Note  I connected with Todd Coffey on 06/30/20 at  9:45 AM EDT by a video enabled telemedicine application and verified that I am speaking with the correct person using two identifiers.  Location: Patient: at his home Provider: in the office    I discussed the limitations of evaluation and management by telemedicine and the availability of in person appointments. The patient expressed understanding and agreed to proceed.  History of Present Illness: Todd Coffey a 59 year old male, seen in request by his primary care nurse practitioner Todd Coffey for evaluation of seizure, he is accompanied by his sister Todd Coffey, who is also his power of attorney. Initial evaluation was on January 06, 2019.   I have reviewed and summarized the referring note from the referring physician. 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   He was started on Keppra since 2018 after recurrent seizure, that was the last seizure he has,  he stopped drinking since August 2019,  He gradually developed worsening gait abnormality since 2018, previously contributed to his substance abuse, getting worse since August 2019, unsteady gait, tendency to fall,  He also complains of memory loss, difficulty focusing, bilateral feet paresthesia  Laboratory evaluations in January 2020, Keppra level was 2.2, normal magnesium, TSH, ammonia level, CMP showed no significant abnormality.CBC showed hemoglobin of 12.9,  Video Visit Dr. Terrace Coffey 04/09/2019 S  History of Present Illness: I saw patient with his sister Todd Coffey, he is overall doing well, has no recurrent seizure, taking keppra 500mg  bid.   EEG was normal on January 15 2019.  I personally reviewedMRI of cervical spine, multilevel degenerative changes, there was no acute abnormalities. MRI of the brain, mild to moderate generalized atrophy, mild corpus callosum and cerebellum vermis atrophy, periventricular small vessel disease  Update December 14, 2019 SS: Mr. Todd Coffey presents today for virtual visit.  He continues to do well.  He has not had recurrent seizure.  He remains on Keppra 500 mg twice a day.  He recently saw his primary doctor, his Keppra level was Coffey.  Turns out he got his medications mixed up, and was not taking Keppra.  He is now back on Keppra.  He is doing well.  He is recovering from shingles, which has been painful.  He lives with his sister, Todd Coffey.  He does not drink alcohol or drive a car.  He is also taking gabapentin for leg cramps.  He has not had any falls, but has chronic issues with balance.  His sister Todd Coffey is present.  Update June 30, 2020 SS: Here for follow-up via VV, accompanied by his sister.  Continues  to do well, no recurrent seizure, taking Keppra 500 mg twice a day.  Overall health is good, still has to be careful to stand slowly. Not drinking ETOH or driving.  No falls.  Takes gabapentin PRN for leg cramps.   I received blood work sent from his primary  doctor collected on 06/22/2020, CBC was unremarkable, hemoglobin 13.8, CMP glucose 107, creatinine 0.80, normal AST/ALT, LDL 90, total cholesterol 235, HDL 42, INR 1.1, vitamin D 42.1.   Observations/Objective: Via virtual visit, is alert and oriented, speech is clear and concise, facial symmetry noted, gait is overall stable, somewhat wide-based, no assistive device  Assessment and Plan: 1.  History of heavy alcohol use, last was in August 2019, also has history of polysubstance abuse, cocaine, marijuana 2.  History of seizure, last seizure was in 2018, often related to alcohol withdrawal 3.  Memory loss 4.  Progressive worsening gait abnormality 5.  Bilateral lower extremity paresthesia, length dependent sensory changes  -Overall, is stable, no recurrent seizure -Continue Keppra 500 mg twice a day -Continue gabapentin for nerve pain in legs, leg cramps, 300 mg twice a day -Per Dr. 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 The patient was provided an opportunity to ask questions and all were answered. The patient agreed with the plan and demonstrated an understanding of the instructions.   The patient was advised to call back or seek an in-person evaluation if the symptoms worsen or if the condition fails to improve as anticipated.  I spent 20 minutes of face-to-face and non-face-to-face time with patient.  This included previsit chart review, lab review, study review, order entry, electronic health record documentation, patient education.  Todd Kluver, DNP  [facility] [address] [place], [place] [address] [phone]

## 2020-08-24 ENCOUNTER — Ambulatory Visit (INDEPENDENT_AMBULATORY_CARE_PROVIDER_SITE_OTHER): Payer: Medicaid Other | Admitting: Gastroenterology

## 2020-08-24 ENCOUNTER — Encounter: Payer: Self-pay | Admitting: Gastroenterology

## 2020-08-24 ENCOUNTER — Telehealth: Payer: Self-pay

## 2020-08-24 ENCOUNTER — Other Ambulatory Visit: Payer: Self-pay | Admitting: Gastroenterology

## 2020-08-24 VITALS — BP 120/68 | HR 73 | Ht 66.0 in | Wt 144.4 lb

## 2020-08-24 DIAGNOSIS — K7030 Alcoholic cirrhosis of liver without ascites: Secondary | ICD-10-CM | POA: Diagnosis not present

## 2020-08-24 NOTE — Progress Notes (Signed)
Chief Complaint: FU  Referring Provider:  Earlyne Iba, NP      ASSESSMENT AND PLAN;   #1. GERD with H/O HH, severe recurrent eso stricture s/p frequent dilatations 11/2016, 07/10/2019 09/2019 (with steroid injection), 12/2019, 04/2020.  #2. ETOH liver cirrhosis with H/O ascites.  Mild HE with sl elevated ammonia. [place] 03/2028- mild cirrhosis, no ascites or splenomegaly.  Normal flow on Doppler. Nl AFP 03/2019. Alb 4.3 (03/2019)  #3. H/O UGI bleeding d/t erosive esophagitis s/p clipping and endoscopy x3 with severe coagulopathy/alcohol abuse (INR>9)and hypovolemic/septic shock s/p 10U PRBC. No Varices. (Aug 2019 @ [place])  #4. Chronic ETOH pancreatitis (quit ETOH Jun 30, 2018, when he moved to [facility]). MRI/MRCP 09/2018 -acute on chronic pancreatitis.  Possibly pancreas divisum.  Diffusely mildly dilated pancreatic duct. No PD stones/ masses. 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  No exocrine or endocrine pancreatic insufficiency clinically. H/O pancreatic pseudocyst s/p endoscopic drainage in past. Nl CA19-9, CEA 03/2019  #5.  H/O seizures (? ETOH withdrawal)  #6.  H/O multi-substance abuse (cocaine, marijuana, tobacco).  Quit since he moved to [facility].  Under strict care of his sister Todd Coffey.  #7.  Comorbid conditions- SZ, anxiety/depression, family history of von Willebrand disease   Plan: -Continue protonix 22m po bid. -[facility] [place]. -Salt and fluid restricted diet.  -Continue low dose lasix and spironolactone. -CBC, CMP, AFP, PT INR -Continue rest of the medications -I have instructed patient that he needs to chew foods especially meats and breads well and eat slowly.   HPI:    VTerril Chestnutis a 59y.o. male   For follow-up Doing very well Still having problems swallowing at times. Would like to get repeat EGD with dilatation.  The EGD with dilatation with steroids helped the  most.   Has been compliant with his medications.  No nonsteroidals.  He has also been compliant with salt restriction.  No further alcohol.  Of note - has quit drinking all alcohol, quit all drugs, quit smoking since May 2019.  He was very sick in May with upper GI bleeding/alcohol withdrawal.  [place]   On lactulose 1/day with 1-2 softer BM/day. Rifaxamin not started as it was not approved.   Review of past medical records: -H/O UGI bleeding d/t erosive esophagitis s/p clipping and endoscopy x3 with severe coagulopathy/alcohol abuse (INR>9)and hypovolemic/septic shock s/p 10U PRBC. No Varices. (Aug 2019   [place] for mod sigmoid diverticulosis (report in care everywhere).  No need for 10 years.  Past Medical History:  Diagnosis Date  . Acute on chronic pancreatitis (HCC)    hx of pancreatitis; nothing recently til today /notes 09/17/2018 (09/17/2018)  . Acute stomach ulcer 07/2018  . Alcohol dependence with withdrawal ([place] 019/14/7829  w/complications  . Alcohol withdrawal ([place] 09/19/2016  . Alcohol-induced chronic pancreatitis ([place] 09/26/2014; 09/19/2016  . Alcoholic gastritis 156/21/3086 . Alcoholic liver disease ([place] 07/21/2018  . Altered mental status 05/06/2013  . Anemia due to acute blood loss 07/2018  . Anxiety attack   . Ascites due to alcoholic hepatitis 057/84/6962 . Chest pain 11/09/2013   "have had chest pains several times" (09/17/2018)  . Chronic alcoholism ([place]    /Archie Endo11/13/2019  . Coagulopathy ([place] unknown  . Family history of von Willebrand disease    "sister, Todd Coffey" (09/17/2018)  .  Gastrointestinal hemorrhage with melena unknown  . GERD (gastroesophageal reflux disease)   . Headache    "weekly" (09/17/2018)  . History of blood transfusion 06/2018   "10 units in 8 h; probably had some before" (09/17/2018)  . History of cirrhosis of liver    /notes 09/17/2018  . History  of hiatal hernia   . History of shingles   . Hypertension   . Hypokalemia 09/19/2016  . Hypomagnesemia 09/19/2016  . Irregular heart rate 07/2018  . Pancreatic mass    pancreatic masses/notes 09/17/2018  . 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 Protein malnutrition ([place]) 07/02/2018   chronic  . Scrotal edema 07/21/2018  . Seizure ([place]) last 2018?   "since ~ 2002; several; controlled w/RX" (09/17/2018)/over 2 years /pt is weaning off meds  . Shingles 10/2019  . Subconjunctival hemorrhage of right eye 06/14/2016  . Substance abuse ([place])   . Tremor 09/26/2014    Past Surgical History:  Procedure Laterality Date  . COLONOSCOPY    . ESOPHAGEAL VARICE LIGATION  06/2018  . ESOPHAGOGASTRODUODENOSCOPY  04/2019  . HEMORRHOID SURGERY    . INGUINAL HERNIA REPAIR Right X 2  . PANCREAS SURGERY     "cyst"  . UPPER GASTROINTESTINAL ENDOSCOPY      Family History  Problem Relation Age of Onset  . Stroke Mother   . Congestive Heart Failure Mother   . Other Father        unsure of history   . Alcohol abuse Maternal Uncle   . Colon cancer Neg Hx   . Esophageal cancer Neg Hx   . Rectal cancer Neg Hx   . Stomach cancer Neg Hx   . Colon polyps Neg Hx     Social History   Tobacco Use  . Smoking status: Former Smoker    Packs/day: 0.33    Years: 45.00    Pack years: 14.85    Types: Cigarettes    Quit date: 2019    Years since quitting: 2.8  . Smokeless tobacco: Never Used  . Tobacco comment: tried smokeless 1 or 2 times but it wasn't his thing per patient  Vaping Use  . Vaping Use: Former  . Quit date: 07/09/2018  Substance Use Topics  . Alcohol use: Not Currently    Comment:  "nothing since 06/30/2018"  . Drug use: Not Currently    Types: Marijuana    Comment: "I've done everything but heroin; last drug use was marijuana in ~ 03/2018"     Current Outpatient Medications  Medication Sig Dispense Refill  . acetaminophen (TYLENOL) 500 MG tablet Take 1 tablet (500 mg total) by  mouth every 6 (six) hours as needed for mild pain or moderate pain. 30 tablet   . Cholecalciferol (VITAMIN D3 PO) Take 1,000 Units by mouth once a week.     . Cyclobenzaprine HCl (FLEXERIL PO) Take 1 tablet by mouth every 12 (twelve) hours.    . folic acid (FOLVITE) 1 MG tablet Take 1 tablet (1 mg total) by mouth daily. 90 tablet 1  . furosemide (LASIX) 20 MG tablet Take 1 tablet (20 mg total) by mouth daily. 90 tablet 1  . gabapentin (NEURONTIN) 300 MG capsule Take 1 capsule (300 mg total) by mouth 2 (two) times daily. 60 capsule 11  . lactulose (CHRONULAC) 10 GM/15ML solution Take 15 mLs (10 g total) by mouth daily. 1892 mL 2  . levETIRAcetam (KEPPRA) 500 MG tablet Take 1 tablet (500 mg total) by mouth 2 (two) times  daily. 180 tablet 4  . metoprolol tartrate (LOPRESSOR) 25 MG tablet Take 1 tablet (25 mg total) by mouth 2 (two) times daily. 180 tablet 1  . pantoprazole (PROTONIX) 40 MG tablet TAKE ONE TABLET BY MOUTH TWICE DAILY BEFORE MEALS 60 tablet 3  . predniSONE (STERAPRED UNI-PAK 21 TAB) 10 MG (21) TBPK tablet Take 10 mg by mouth as directed.    Marland Kitchen spironolactone (ALDACTONE) 25 MG tablet Take 1 tablet (25 mg total) by mouth daily. 90 tablet 1  . therapeutic multivitamin-minerals (THERAGRAN-M) tablet Take 1 tablet by mouth daily. 30 tablet 6  . thiamine 100 MG tablet Take 1 tablet (100 mg total) by mouth daily. 30 tablet 6   No current facility-administered medications for this visit.    Allergies  Allergen Reactions  . Morphine And Related Other (See Comments)    Unknown per pt, states he was out of it.   Marland Kitchen Peach [Prunus Persica] Hives    Fresh peaches    Review of Systems:       Physical Exam:    BP 120/68   Pulse 73   Ht _0  (1.676 m)   Wt 144 lb 6 oz (65.5 kg)   BMI 23.30 kg/m  Filed Weights   08/24/20 1034  Weight: 144 lb 6 oz (65.5 kg)   Gen: awake, alert, NAD HEENT: anicteric, no pallor CV: RRR, no mrg Pulm: CTA b/l Abd: soft, NT/ND, +BS throughout Ext:  no c/c/e Neuro: nonfocal  Data Reviewed: I have personally reviewed following labs and imaging studies  CBC: CBC Latest Ref Rng & Units 04/01/2019 10/06/2018 09/20/2018  WBC 4.0 - 10.5 K/uL 6.9 9.7 4.8  Hemoglobin 13.0 - 17.0 g/dL 13.0 12.9(L) 10.9(L)  Hematocrit 39 - 52 % 37.9(L) 38.0 33.0(L)  Platelets 150 - 400 K/uL 203.0 265 126(L)    CMP: CMP Latest Ref Rng & Units 04/01/2019 10/06/2018 09/20/2018  Glucose 70 - 99 mg/dL 82 61(L) 98  BUN 6 - 23 mg/dL 13 12 <5(L)  Creatinine 0.40 - 1.50 mg/dL 0.85 0.58(L) 0.61  Sodium 135 - 145 mEq/L 137 137 136  Potassium 3.5 - 5.1 mEq/L 3.9 4.2 4.6  Chloride 96 - 112 mEq/L 102 97 105  CO2 19 - 32 mEq/L _1 Calcium 8.4 - 10.5 mg/dL 9.2 9.3 8.4(L)  Total Protein 6.0 - 8.3 g/dL 7.6 7.2 6.0(L)  Total Bilirubin 0.2 - 1.2 mg/dL 0.4 0.3 0.6  Alkaline Phos 39 - 117 U/L 97 116 98  AST 0 - 37 U/L 29 33 31  ALT 0 - 53 U/L _2 Hepatic Function Latest Ref Rng & Units 04/01/2019 10/06/2018 09/20/2018  Total Protein 6.0 - 8.3 g/dL 7.6 7.2 6.0(L)  Albumin 3.5 - 5.2 g/dL 4.3 3.7 2.6(L)  AST 0 - 37 U/L 29 33 31  ALT 0 - 53 U/L _3 Alk Phosphatase 39 - 117 U/L 97 116 98  Total Bilirubin 0.2 - 1.2 mg/dL 0.4 0.3 0.6      Carmell Austria, MD 08/24/2020, 10:49 AM  Cc: Earlyne Iba, NP

## 2020-08-24 NOTE — Patient Instructions (Addendum)
If you are age 59 or older, your body mass index should be between 23-30. Your Body mass index is 23.3 kg/m. If this is out of the aforementioned range listed, please consider follow up with your Primary Care Provider.  If you are age 64 or younger, your body mass index should be between 19-25. Your Body mass index is 23.3 kg/m. If this is out of the aformentioned range listed, please consider follow up with your Primary Care Provider.    You have been scheduled for an abdominal ultrasound at [facility] (1st floor [address] ) on 09/02/20 at 10am. Please arrive 15 minutes prior to your appointment for registration. Make certain not to have anything to eat or drink 6 hours prior to your appointment. Should you need to reschedule your appointment, please contact radiology at [phone]. This test typically takes about 30 minutes to perform.   Please go next door and have your labs drawn before you leave the office today.   You have been scheduled for an endoscopy. Please follow written instructions given to you at your visit today. If you use inhalers (even only as needed), please bring them with you on the day of your procedure.   Thank you,  Dr. Lynann Bologna

## 2020-08-24 NOTE — Telephone Encounter (Signed)
Patient will need Kenalog Injection with his EGd that is scheduled on 09/16/20 at 3:30pm with Dr. Chales Abrahams.

## 2020-08-25 LAB — CBC WITH DIFFERENTIAL/PLATELET
Basophils Absolute: 0 10*3/uL (ref 0.0–0.2)
Basos: 0 %
EOS (ABSOLUTE): 0.1 10*3/uL (ref 0.0–0.4)
Eos: 1 %
Hematocrit: 36.6 % — ABNORMAL LOW (ref 37.5–51.0)
Hemoglobin: 12.6 g/dL — ABNORMAL LOW (ref 13.0–17.7)
Immature Grans (Abs): 0 10*3/uL (ref 0.0–0.1)
Immature Granulocytes: 1 %
Lymphocytes Absolute: 1.9 10*3/uL (ref 0.7–3.1)
Lymphs: 31 %
MCH: 31 pg (ref 26.6–33.0)
MCHC: 34.4 g/dL (ref 31.5–35.7)
MCV: 90 fL (ref 79–97)
Monocytes Absolute: 0.7 10*3/uL (ref 0.1–0.9)
Monocytes: 12 %
Neutrophils Absolute: 3.5 10*3/uL (ref 1.4–7.0)
Neutrophils: 55 %
Platelets: 186 10*3/uL (ref 150–450)
RBC: 4.07 x10E6/uL — ABNORMAL LOW (ref 4.14–5.80)
RDW: 13 % (ref 11.6–15.4)
WBC: 6.3 10*3/uL (ref 3.4–10.8)

## 2020-08-25 LAB — COMPREHENSIVE METABOLIC PANEL
ALT: 30 IU/L (ref 0–44)
AST: 26 IU/L (ref 0–40)
Albumin/Globulin Ratio: 1.8 (ref 1.2–2.2)
Albumin: 4.9 g/dL (ref 3.8–4.9)
Alkaline Phosphatase: 92 IU/L (ref 44–121)
BUN/Creatinine Ratio: 21 — ABNORMAL HIGH (ref 9–20)
BUN: 19 mg/dL (ref 6–24)
Bilirubin Total: 0.5 mg/dL (ref 0.0–1.2)
CO2: 27 mmol/L (ref 20–29)
Calcium: 9.8 mg/dL (ref 8.7–10.2)
Chloride: 98 mmol/L (ref 96–106)
Creatinine, Ser: 0.89 mg/dL (ref 0.76–1.27)
GFR calc Af Amer: 108 mL/min/{1.73_m2} (ref >59)
GFR calc non Af Amer: 94 mL/min/{1.73_m2} (ref >59)
Globulin, Total: 2.8 g/dL (ref 1.5–4.5)
Glucose: 79 mg/dL (ref 65–99)
Potassium: 4.7 mmol/L (ref 3.5–5.2)
Sodium: 136 mmol/L (ref 134–144)
Total Protein: 7.7 g/dL (ref 6.0–8.5)

## 2020-08-25 LAB — PROTIME-INR

## 2020-08-25 LAB — AFP TUMOR MARKER: AFP, Serum, Tumor Marker: 2.7 ng/mL (ref 0.0–8.3)

## 2020-09-02 ENCOUNTER — Other Ambulatory Visit: Payer: Self-pay

## 2020-09-02 ENCOUNTER — Ambulatory Visit (HOSPITAL_BASED_OUTPATIENT_CLINIC_OR_DEPARTMENT_OTHER)
Admission: RE | Admit: 2020-09-02 | Discharge: 2020-09-02 | Disposition: A | Discharge status: Home / Self Care | Payer: Medicaid Other | Source: Ambulatory Visit | Attending: Gastroenterology | Admitting: Gastroenterology

## 2020-09-02 DIAGNOSIS — K7030 Alcoholic cirrhosis of liver without ascites: Secondary | ICD-10-CM | POA: Insufficient documentation

## 2020-09-15 ENCOUNTER — Encounter: Payer: Self-pay | Admitting: Certified Registered Nurse Anesthetist

## 2020-09-16 ENCOUNTER — Other Ambulatory Visit: Payer: Self-pay

## 2020-09-16 ENCOUNTER — Ambulatory Visit (AMBULATORY_SURGERY_CENTER): Payer: Medicaid Other | Admitting: Gastroenterology

## 2020-09-16 ENCOUNTER — Encounter: Payer: Self-pay | Admitting: Gastroenterology

## 2020-09-16 VITALS — BP 113/71 | HR 64 | Temp 97.7°F | Resp 14 | Ht 66.0 in | Wt 144.0 lb

## 2020-09-16 DIAGNOSIS — R1310 Dysphagia, unspecified: Secondary | ICD-10-CM | POA: Diagnosis not present

## 2020-09-16 DIAGNOSIS — K222 Esophageal obstruction: Secondary | ICD-10-CM

## 2020-09-16 DIAGNOSIS — K449 Diaphragmatic hernia without obstruction or gangrene: Secondary | ICD-10-CM | POA: Diagnosis not present

## 2020-09-16 DIAGNOSIS — K7460 Unspecified cirrhosis of liver: Secondary | ICD-10-CM

## 2020-09-16 MED ORDER — SODIUM CHLORIDE 0.9 % IV SOLN: 500.0000 mL | Freq: Once | INTRAVENOUS | Status: DC

## 2020-09-16 NOTE — Progress Notes (Signed)
[facility]  No seizure activity since 2018

## 2020-09-16 NOTE — Progress Notes (Signed)
Report given to PACU, vss 

## 2020-09-16 NOTE — Progress Notes (Addendum)
Called to room to assist during endoscopic procedure.  Patient ID and intended procedure confirmed with present staff. Received instructions for my participation in the procedure from the performing physician.  1.2 mL Kenalog diluted in 6 mL of normal saline given- injected 1cc per quadrant as directed per Dr. Chales Abrahams.

## 2020-09-16 NOTE — Patient Instructions (Addendum)
HANDOUTS PROVIDED ON: POST DILATION DIET & HIATAL HERNIA  For today follow the Post Dilation Diet which consists of clear liquids only until 4:30pm and then soft foods for the rest of today.  You may resume your regular diet tomorrow.  Thank you for allowing [place] to care for you today!!!   YOU HAD AN ENDOSCOPIC PROCEDURE TODAY AT THE [facility]:   Refer to the procedure report that was given to you for any specific questions about what was found during the examination.  If the procedure report does not answer your questions, please call your gastroenterologist to clarify.  If you requested that your care partner not be given the details of your procedure findings, then the procedure report has been included in a sealed envelope for you to review at your convenience later.  YOU SHOULD EXPECT: Some feelings of bloating in the abdomen. Passage of more gas than usual.  Walking can help get rid of the air that was put into your GI tract during the procedure and reduce the bloating.   Please Note:  You might notice some irritation and congestion in your nose or some drainage.  This is from the oxygen used during your procedure.  There is no need for concern and it should clear up in a day or so.  SYMPTOMS TO REPORT IMMEDIATELY:   Following upper endoscopy (EGD)  Vomiting of blood or coffee ground material  New chest pain or pain under the shoulder blades  Painful or persistently difficult swallowing  New shortness of breath  Fever of 100F or higher  Black, tarry-looking stools  For urgent or emergent issues, a gastroenterologist can be reached at any hour by calling [phone]. Do not use MyChart messaging for urgent concerns.    DIET:  We do recommend a post dilation diet today and tomorrow you may start with a small meal at first, but then you may proceed to your regular diet.  Drink plenty of fluids but you should avoid alcoholic beverages for 24 hours.  ACTIVITY:  You  should plan to take it easy for the rest of today and you should NOT DRIVE or use heavy machinery until tomorrow (because of the sedation medicines used during the test).    FOLLOW UP: Our staff will call the number listed on your records Tuesday morning between 7:15 am and 8:15 am to check on you and address any questions or concerns that you may have regarding the information given to you following your procedure. If we do not reach you, we will leave a message.  We will attempt to reach you two times.  During this call, we will ask if you have developed any symptoms of COVID 19. If you develop any symptoms (ie: fever, flu-like symptoms, shortness of breath, cough etc.) before then, please call [phone].  If you test positive for Covid 19 in the 2 weeks post procedure, please call and report this information to [place].    If any biopsies were taken you will be contacted by phone or by letter within the next 1-3 weeks.  Please call us at [phone] if you have not heard about the biopsies in 3 weeks.    SIGNATURES/CONFIDENTIALITY: You and/or your care partner have signed paperwork which will be entered into your electronic medical record.  These signatures attest to the fact that that the information above on your After Visit Summary has been reviewed and is understood.  Full responsibility of the confidentiality of this  discharge information lies with you and/or your care-partner. 

## 2020-09-16 NOTE — Op Note (Signed)
[facility] Patient Name: Todd Coffey Procedure Date: 09/16/2020 2:53 PM MRN: [id number] Endoscopist: Lynann Bologna , MD Age: 59 Referring MD:  Date of Birth: Mar 14, 1961 Gender: Male Account #: [id number] Procedure:                Upper GI endoscopy Indications:              Dysphagia with known recurrent esophageal stricture. Medicines:                Monitored Anesthesia Care Procedure:                Pre-Anesthesia Assessment:                           - Prior to the procedure, a History and Physical                            was performed, and patient medications and                            allergies were reviewed. The patient's tolerance of                            previous anesthesia was also reviewed. The risks                            and benefits of the procedure and the sedation                            options and risks were discussed with the patient.                            All questions were answered, and informed consent                            was obtained. Prior Anticoagulants: The patient has                            taken no previous anticoagulant or antiplatelet                            agents. ASA Grade Assessment: III - A patient with                            severe systemic disease. After reviewing the risks                            and benefits, the patient was deemed in                            satisfactory condition to undergo the procedure.                           After obtaining informed consent, the endoscope was  passed under direct vision. Throughout the                            procedure, the patient's blood pressure, pulse, and                            oxygen saturations were monitored continuously. The                            Endoscope was introduced through the mouth, and                            advanced to the second part of duodenum. The upper                             GI endoscopy was accomplished without difficulty.                            The patient tolerated the procedure well. Scope In: Scope Out: Findings:                 One benign-appearing, intrinsic severe (stenosis;                            an endoscope cannot pass) stenosis was found 35 cm                            from the incisors. This stenosis measured 8 mm                            (inner diameter). The stenosis was traversed after                            dilation. A TTS dilator was passed through the                            scope. Dilation with an 09-16-12 mm balloon dilator                            was performed to 13 mm. Area was successfully                            injected with 4 mL of triamcinolone (10 mg/mL) for                            drug delivery in all 4 quads esp in mucosal rent.                            Estimated blood loss: none. Biopsies were not                            performed since previous extensive biopsies were  negative for EoE/neoplasia.                           A 4 cm hiatal hernia was present.                           The examined duodenum was normal. Complications:            No immediate complications. Estimated Blood Loss:     Estimated blood loss: none. Impression:               - Benign-appearing esophageal stenosis. Dilated.                            Injected.                           - 4 cm hiatal hernia.                           - No specimens collected. Recommendation:           - Patient has a contact number available for                            emergencies. The signs and symptoms of potential                            delayed complications were discussed with the                            patient. Return to normal activities tomorrow.                            Written discharge instructions were provided to the                            patient.                           - Post  dilatation diet.                           - Continue present medications including omeprazole.                           - Recommend repeat EGD with dilatation in 12 weeks.                           - The findings and recommendations were discussed                            with the patient's family. Lynann Bologna, MD 09/16/2020 3:34:07 PM This report has been signed electronically.

## 2020-09-20 ENCOUNTER — Telehealth: Payer: Self-pay | Admitting: *Deleted

## 2020-09-20 NOTE — Telephone Encounter (Signed)
Second attempt, left VM.  

## 2020-09-20 NOTE — Telephone Encounter (Signed)
First attempt, left VM.  

## 2020-10-31 ENCOUNTER — Telehealth: Payer: Self-pay | Admitting: *Deleted

## 2020-10-31 NOTE — Telephone Encounter (Signed)
I spoke to the patient and his sister, Todd Coffey (they were on speaker phone together). They verbalized understanding of the lab findings. He actually was fasting for these labs and had not taken his morning dose of Keppra. He is doing well. Denies any seizure activity. For his next labs, he will take the morning dose.

## 2020-10-31 NOTE — Telephone Encounter (Signed)
Received lab results to my desk for this pt by Florene Route NP.

## 2020-10-31 NOTE — Telephone Encounter (Signed)
Received labs from PCP collected 10/24/20, Keppra level mildly low 7.4, ammonia level normal 75, CBC unremarkable.  Cholesterol within normals range 166, LDL 97.  Even Keppra level mildly low, last visit in August, indicated doing well, no recurrent seizure.  If this continues to be the case, would not recommend dose alteration.

## 2020-11-21 ENCOUNTER — Other Ambulatory Visit: Payer: Self-pay | Admitting: Gastroenterology

## 2020-11-21 DIAGNOSIS — K7030 Alcoholic cirrhosis of liver without ascites: Secondary | ICD-10-CM

## 2020-11-26 IMAGING — RF ESOPHAGUS/BARIUM SWALLOW/TABLET STUDY
7 series · 21 of 24 positions shown · non-contrast
Comparison: None.

CLINICAL DATA: Dysphagia.  Food getting stuck.  Prior stretching.

EXAM:
ESOPHOGRAM / BARIUM SWALLOW / BARIUM TABLET STUDY
TECHNIQUE: Combined double contrast and single contrast examination performed
using effervescent crystals, thick barium liquid, and thin barium
liquid. The patient was observed with fluoroscopy swallowing a 13 mm
barium sulphate tablet.
FLUOROSCOPY TIME:  Fluoroscopy Time:  1 minutes 30 seconds
Radiation Exposure Index (if provided by the fluoroscopic device):
Number of Acquired Spot Images: 0

[Series 1: cp_standard · 0.34mm/px · 3 of 134 frames shown (1 of 7)]
[frame 21/134]
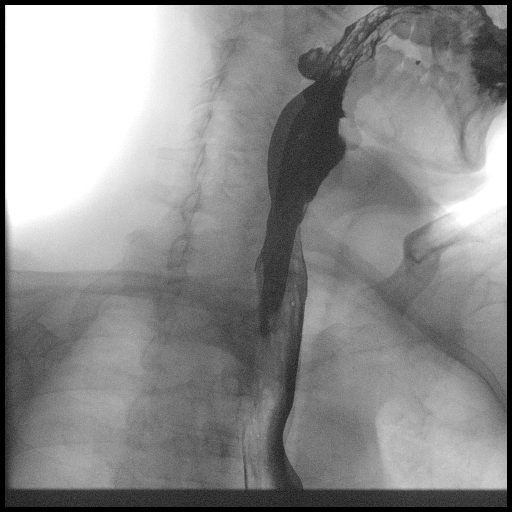
[frame 68/134]
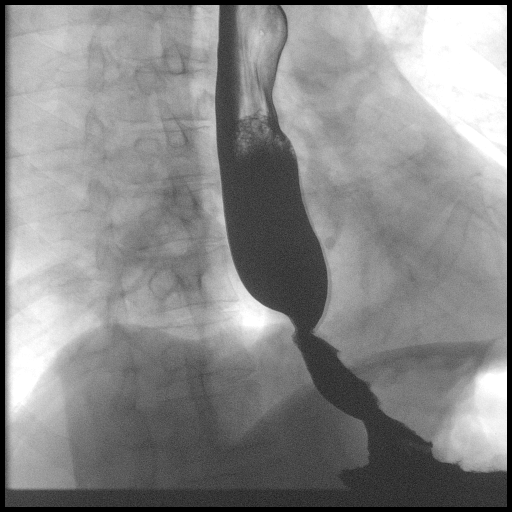
[frame 114/134]
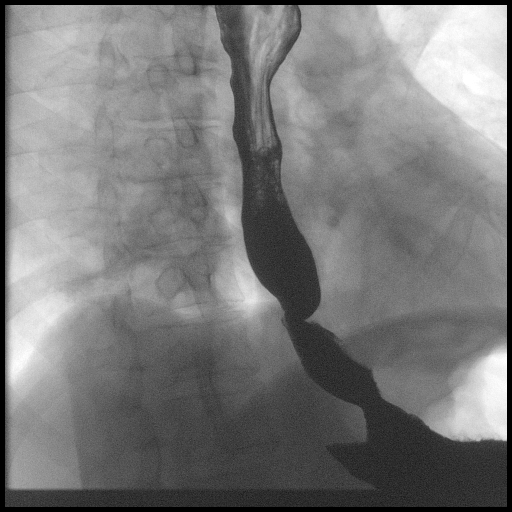

[Series 2: cp_standard · 0.34mm/px · 3 of 52 frames shown (2 of 7)]
[frame 27/52]
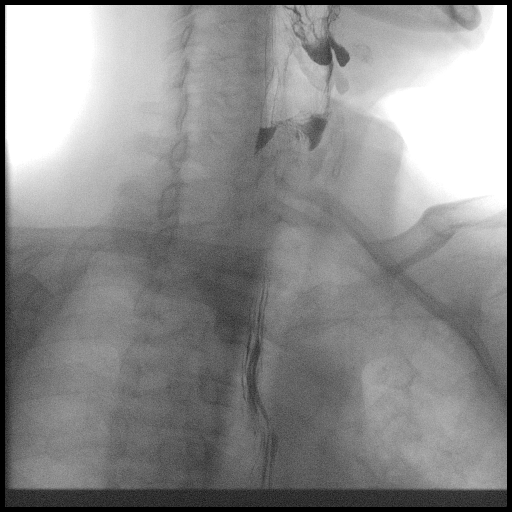
[frame 35/52]
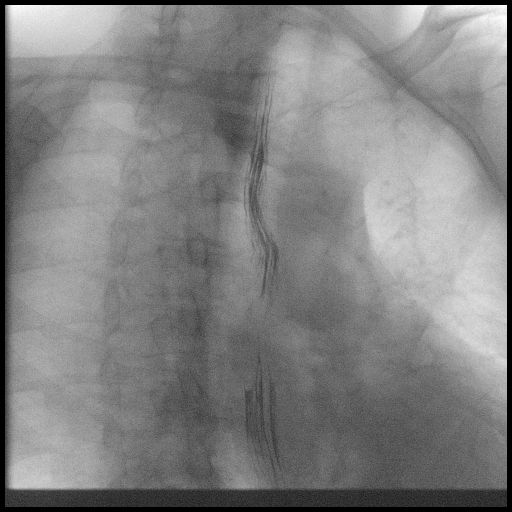
[frame 45/52]
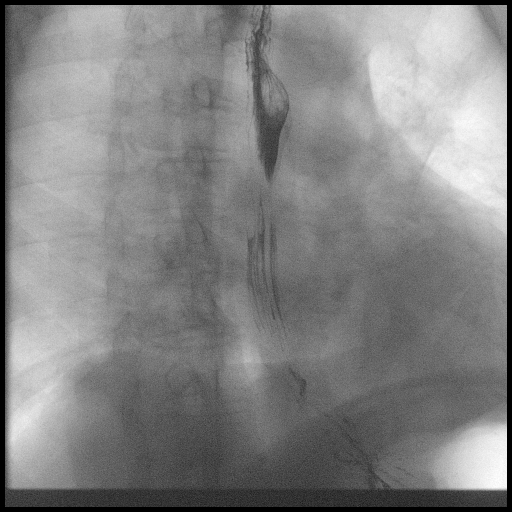

[Series 3: cp_standard · 0.34mm/px · 3 of 77 frames shown (3 of 7)]
[frame 9/77]
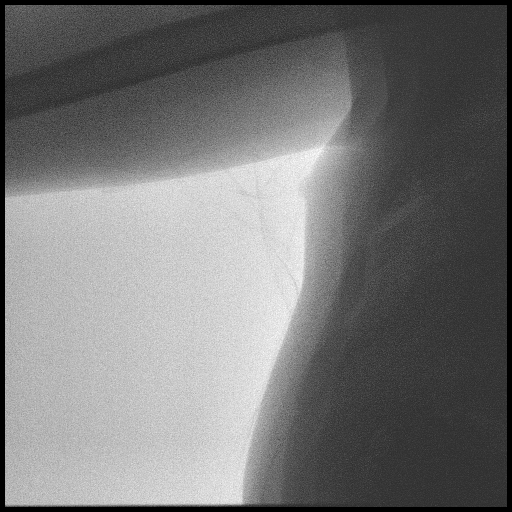
[frame 12/77]
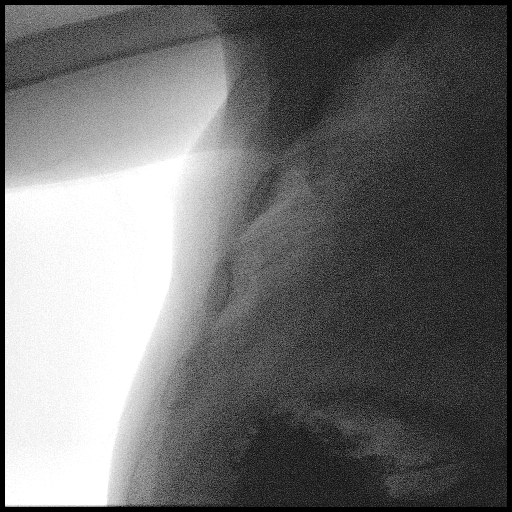
[frame 66/77]
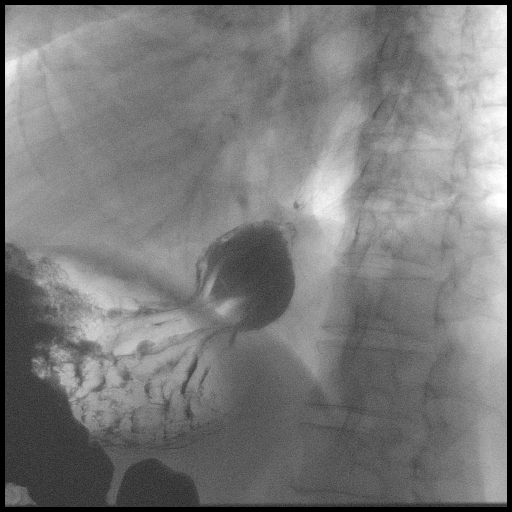

[Series 4: cp_standard · 0.51mm/px · 3 of 73 frames shown (4 of 7)]
[frame 7/73]
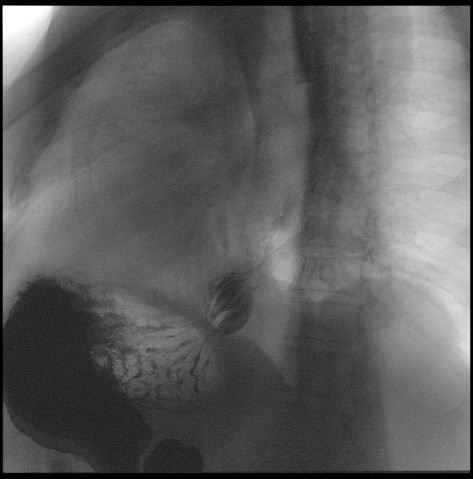
[frame 37/73]
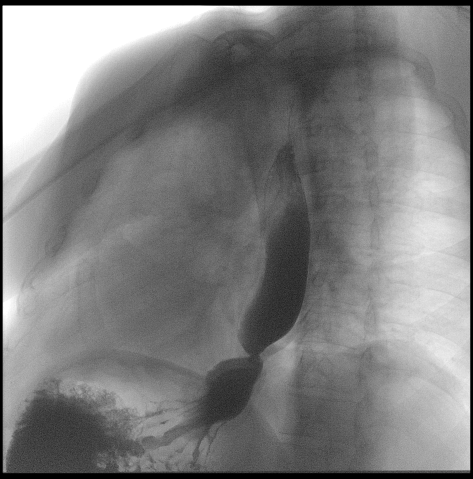
[frame 63/73]
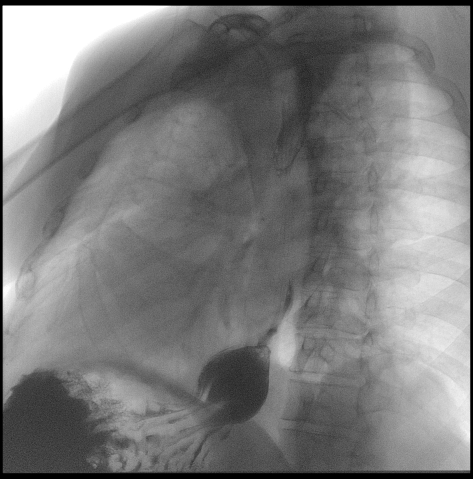

[Series 5: cp_standard · 0.51mm/px · 3 of 86 frames shown (5 of 7)]
[frame 13/86]
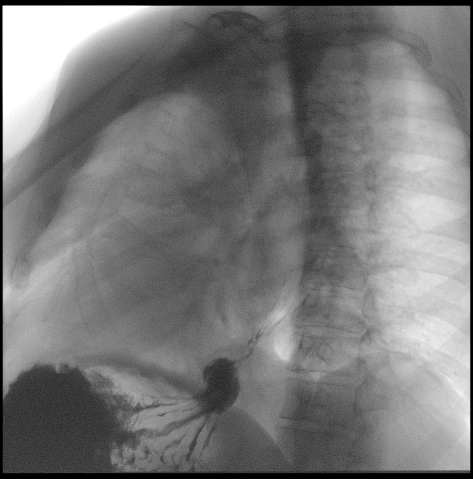
[frame 47/86]
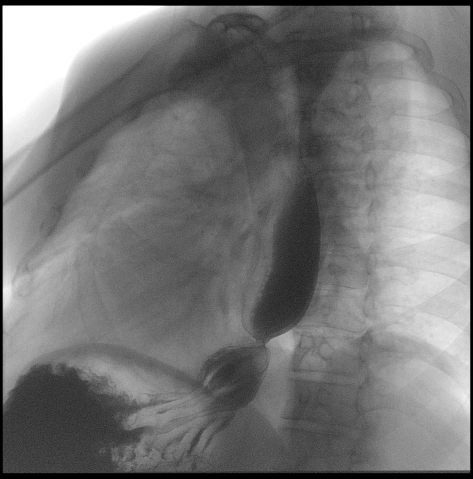
[frame 74/86]
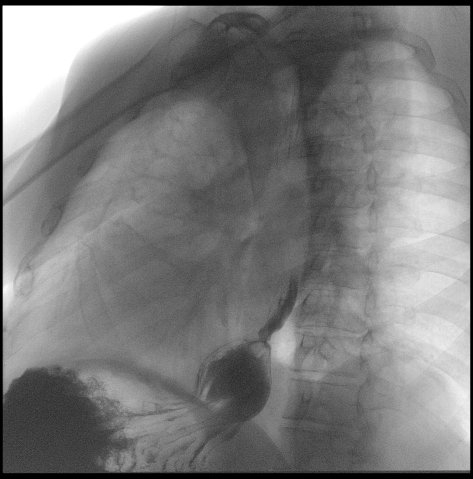

[Series 6: cp_standard · 0.51mm/px · 3 of 90 frames shown (6 of 7)]
[frame 14/90]
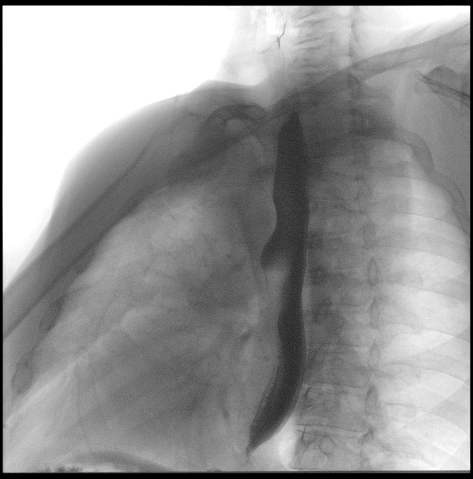
[frame 46/90]
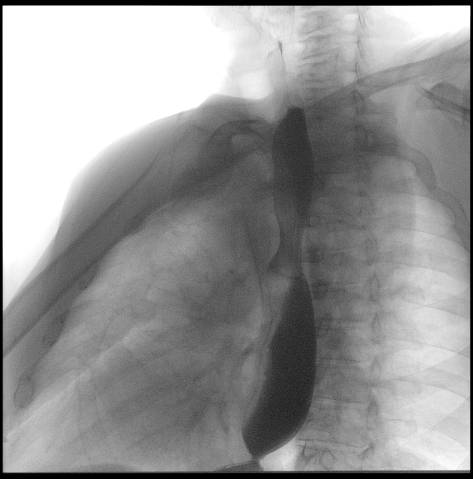
[frame 77/90]
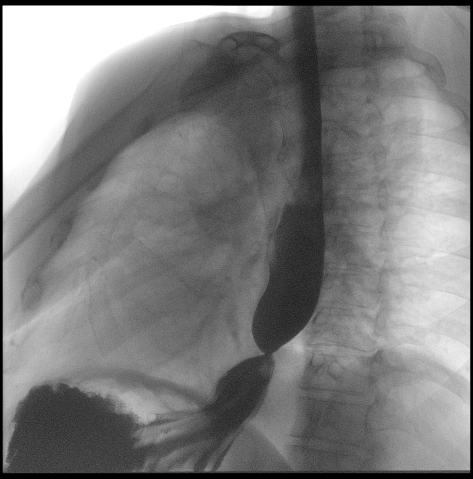

[Series 7: cp_standard · 0.51mm/px · 3 of 112 frames shown (7 of 7)]
[frame 50/112]
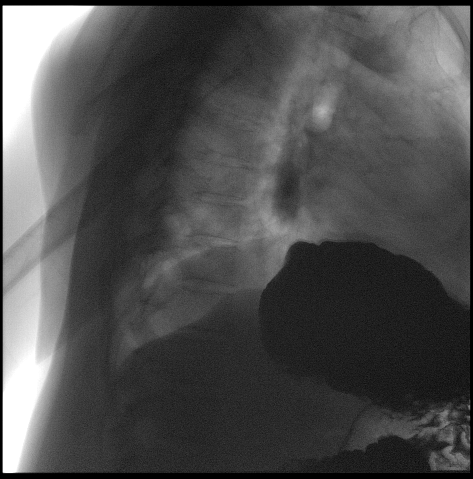
[frame 57/112]
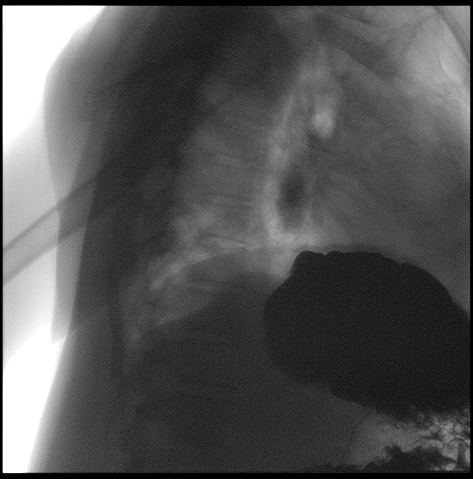
[frame 96/112]
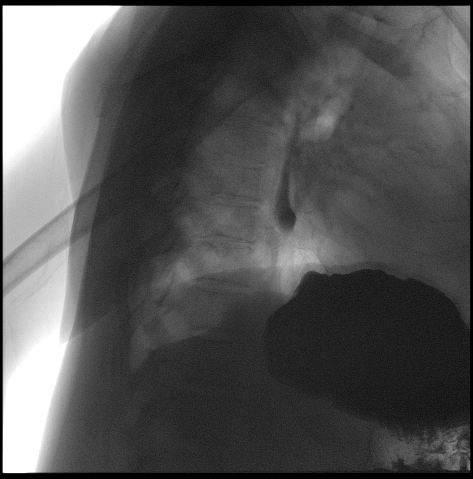

[21 of 24 positions shown; findings below may reference images not displayed]

FINDINGS: Fluoroscopic evaluation of swallowing demonstrates normal esophageal
motility. There is a small hiatal hernia. Just above the hernia,
there is a tight smooth focal stricture. No other esophageal
abnormality noted. No fold thickening. No reflux with the water
siphon maneuver. The patient was not given the 13 mm deck barium
tablet due to the tightness of the distal esophageal stricture.
IMPRESSION: Small hiatal hernia.

Tight focal stricture in the distal esophagus.

## 2021-03-28 ENCOUNTER — Other Ambulatory Visit: Payer: Self-pay | Admitting: Gastroenterology

## 2021-03-28 DIAGNOSIS — R1310 Dysphagia, unspecified: Secondary | ICD-10-CM

## 2021-03-28 DIAGNOSIS — K7460 Unspecified cirrhosis of liver: Secondary | ICD-10-CM

## 2021-06-07 ENCOUNTER — Encounter: Payer: Self-pay | Admitting: Gastroenterology

## 2021-07-03 ENCOUNTER — Ambulatory Visit: Payer: Medicaid Other | Admitting: Neurology

## 2021-07-11 ENCOUNTER — Ambulatory Visit (INDEPENDENT_AMBULATORY_CARE_PROVIDER_SITE_OTHER): Payer: Medicaid Other | Admitting: Neurology

## 2021-07-11 ENCOUNTER — Encounter: Payer: Self-pay | Admitting: Neurology

## 2021-07-11 VITALS — BP 141/78 | HR 58 | Ht 66.0 in | Wt 140.0 lb

## 2021-07-11 DIAGNOSIS — R569 Unspecified convulsions: Secondary | ICD-10-CM

## 2021-07-11 DIAGNOSIS — R269 Unspecified abnormalities of gait and mobility: Secondary | ICD-10-CM

## 2021-07-11 MED ORDER — LEVETIRACETAM 500 MG PO TABS: 500.0000 mg | ORAL_TABLET | Freq: Two times a day (BID) | ORAL | 4 refills | Status: AC

## 2021-07-11 NOTE — Progress Notes (Signed)
Chief Complaint  Patient presents with   Follow-up    Room 13 w/ sister, Babette Relic. Yearly follow up. Denies any seizure activity. He has continued taking generic Keppra 500mg , one tablet twice daily.      ASSESSMENT AND PLAN  Todd Coffey is a 60 y.o. 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 MRI of brain in March 2020: 1.    Mild to moderate generalized cortical atrophy and mild corpus callosum and cerebellar vermian atrophy. 2.    Few punctate T2/flair hyperintense foci in the hemispheres consistent with minimal age-related chronic microvascular ischemic change. 3.    There is a normal enhancement pattern and there are no acute findings.  MRI cervical  1.    The spinal cord appears normal.  2.    Mild multilevel degenerative changes as detailed above that do not lead to any nerve root compression or spinal stenosis.  MEDICAL HISTORY:  Todd Coffey is a 60 year old male, seen in request by his primary care nurse practitioner 58 for evaluation of seizure, he is accompanied by his sister Wenda Low, who is also his power of attorney.  Initial evaluation was on January 06, 2019.    I have reviewed and summarized the referring note from the referring physician.  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    He was started on Keppra since 2018 after recurrent seizure, that was the last seizure he has, he stopped drinking since August 2019,   He gradually developed worsening gait abnormality since 2018, previously contributed to his substance abuse, getting worse since August 2019, unsteady gait, tendency to fall,   He also complains of memory loss, difficulty focusing, bilateral feet paresthesia   Laboratory evaluations in January 2020, Keppra level was 2.2, normal magnesium, TSH, ammonia level, CMP showed no significant abnormality.CBC showed hemoglobin of 12.9,   Video Visit Dr. 04/09/2019  I saw patient with his sister [name], he is overall doing well, has no recurrent seizure, taking  keppra 500mg  bid.    EEG was normal on January 15 2019.   I personally reviewed MRI of cervical spine, multilevel degenerative changes, there was no acute abnormalities. MRI of the brain, mild to moderate generalized atrophy, mild corpus  callosum and cerebellum vermis atrophy, periventricular small vessel disease  UPDATE Sept 6 2022: Is doing very well, no recurrent seizure, taking Keppra 500 mg twice daily, generally in good mood, no longer to drink alcohol, no illicit drug use, continue has mildly unsteady gait, that has been stable  Laboratory evaluation in June 2022, normal CMP, LDL     PHYSICAL EXAM:   Vitals:   07/11/21 1115  BP: (!) 141/78  Pulse: (!) 58  Weight: 140 lb (63.5 kg)  Height: 5\' 6"  (1.676 m)   Not recorded     Body mass index is 22.6 kg/m.  PHYSICAL EXAMNIATION:  Gen: NAD, conversant, well nourised, well groomed            NEUROLOGICAL EXAM:  MENTAL STATUS: Speech/cognition: Awake, alert, oriented to history taking and casual conversation CRANIAL NERVES: CN II: Visual fields are full to confrontation. Pupils are round equal and briskly reactive to light. CN III, IV, VI: extraocular movement are normal. No ptosis. CN V: Facial sensation is intact to light touch CN VII: Face is symmetric with normal eye closure  CN VIII: Hearing is normal to causal conversation. CN IX, X: Phonation is normal. CN XI: Head turning and shoulder shrug are intact  MOTOR: There is no pronator drift of out-stretched arms. Muscle bulk and tone are normal. Muscle strength is normal.  REFLEXES: Reflexes are 1 and symmetric at the biceps, triceps, knees, and ankles. Plantar responses are flexor.  SENSORY: Intact to light touch,  COORDINATION: Mild truncal ataxia  GAIT/STANCE: He can get up from seated position arm crossed, wide-based, unsteady,  REVIEW OF SYSTEMS:  Full 14 system review of systems performed and notable only for as above All other review of systems were negative.   ALLERGIES: Allergies  Allergen Reactions   Morphine And Related Other (See Comments)    Unknown per pt, states he was out of it.    Peach [Prunus Persica] Hives    Fresh peaches    HOME MEDICATIONS: Current  Outpatient Medications  Medication Sig Dispense Refill   acetaminophen (TYLENOL) 500 MG tablet Take 1 tablet (500 mg total) by mouth every 6 (six) hours as needed for mild pain or moderate pain. 30 tablet    Cholecalciferol (VITAMIN D3 PO) Take 1,000 Units by mouth daily.     Cyclobenzaprine HCl (FLEXERIL PO) Take 1 tablet by mouth every 12 (twelve) hours.     diclofenac (VOLTAREN) 75 MG EC tablet Take 75 mg by mouth 2 (two) times daily as needed.     folic acid (FOLVITE) 1 MG tablet Take 1 tablet (1 mg total) by mouth daily. 90 tablet 1   furosemide (LASIX) 20 MG tablet Take 1 tablet (20 mg total) by mouth daily. 90 tablet 1   gabapentin (NEURONTIN) 300 MG capsule Take 1 capsule (300 mg total) by mouth 2 (two) times daily. 60 capsule 11   lactulose (CHRONULAC) 10 GM/15ML solution Take 15 mLs (10 g total) by mouth daily. 1892 mL 2   metoprolol tartrate (LOPRESSOR) 25 MG tablet Take 1 tablet (25 mg total) by mouth 2 (two) times daily. 180 tablet 1   pantoprazole (PROTONIX) 40 MG tablet TAKE ONE TABLET BY MOUTH TWICE DAILY BEFORE MEALS 60 tablet 3  spironolactone (ALDACTONE) 25 MG tablet Take 1 tablet (25 mg total) by mouth daily. 90 tablet 1   therapeutic multivitamin-minerals (THERAGRAN-M) tablet Take 1 tablet by mouth daily. 30 tablet 6   thiamine 100 MG tablet Take 1 tablet (100 mg total) by mouth daily. 30 tablet 6   levETIRAcetam (KEPPRA) 500 MG tablet Take 1 tablet (500 mg total) by mouth 2 (two) times daily. 180 tablet 4   No current facility-administered medications for this visit.    PAST MEDICAL HISTORY: Past Medical History:  Diagnosis Date   Acute on chronic pancreatitis (HCC)    hx of pancreatitis; nothing recently til today /notes 09/17/2018 (09/17/2018)   Acute stomach ulcer 07/2018   Alcohol dependence with withdrawal (HCC) 07/02/2018   w/complications   Alcohol withdrawal (HCC) 09/19/2016   Alcohol-induced chronic pancreatitis (HCC) 09/26/2014; 09/19/2016   Alcoholic  gastritis 09/19/2016   Alcoholic liver disease (HCC) 07/21/2018   Altered mental status 05/06/2013   Anemia due to acute blood loss 07/2018   Anxiety attack    Ascites due to alcoholic hepatitis 07/21/2018   Chest pain 11/09/2013   "have had chest pains several times" (09/17/2018)   Chronic alcoholism (HCC)    Hattie Perch 09/17/2018   Coagulopathy (HCC) unknown   Family history of von Willebrand disease    "sister, [name]" (09/17/2018)   Gastrointestinal hemorrhage with melena unknown   GERD (gastroesophageal reflux disease)    Headache    "weekly" (09/17/2018)   History of blood transfusion 06/2018   "10 units in 8 h; probably had some before" (09/17/2018)   History of cirrhosis of liver    /notes 09/17/2018   History of hiatal hernia    History of shingles    Hypertension    Hypokalemia 09/19/2016   Hypomagnesemia 09/19/2016   Irregular heart rate 07/2018   Pancreatic mass    pancreatic masses/notes 09/17/2018   Portal vein thrombosis 09/19/2016   Protein malnutrition (HCC) 07/02/2018   chronic   Scrotal edema 07/21/2018   Seizure (HCC) last 2018?   "since ~ 2002; several; controlled w/RX" (09/17/2018)/over 2 years /pt is weaning off meds   Shingles 10/2019   Subconjunctival hemorrhage of right eye 06/14/2016   Substance abuse (HCC)    Tremor 09/26/2014    PAST SURGICAL HISTORY: Past Surgical History:  Procedure Laterality Date   COLONOSCOPY     ESOPHAGEAL VARICE LIGATION  06/2018   ESOPHAGOGASTRODUODENOSCOPY  04/2019   HEMORRHOID SURGERY     INGUINAL HERNIA REPAIR Right X 2   PANCREAS SURGERY     "cyst"   UPPER GASTROINTESTINAL ENDOSCOPY      FAMILY HISTORY: Family History  Problem Relation Age of Onset   Stroke Mother    Congestive Heart Failure Mother    Other Father        unsure of history    Alcohol abuse Maternal Uncle    Colon cancer Neg Hx    Esophageal cancer Neg Hx    Rectal cancer Neg Hx    Stomach cancer Neg Hx    Colon polyps Neg Hx      SOCIAL HISTORY: Social History   Socioeconomic History   Marital status: Single    Spouse name: Not on file   Number of children: 0   Years of education: GED   Highest education level: Not on file  Occupational History   Occupation: unemployed  Tobacco Use   Smoking status: Former    Packs/day: 0.33    Years: 45.00  Pack years: 14.85    Types: Cigarettes    Quit date: 2019    Years since quitting: 3.6   Smokeless tobacco: Never   Tobacco comments:    tried smokeless 1 or 2 times but it wasn't his thing per patient  Vaping Use   Vaping Use: Former   Quit date: 07/09/2018  Substance and Sexual Activity   Alcohol use: Not Currently    Comment:  "nothing since 06/30/2018"   Drug use: Not Currently    Types: Marijuana    Comment: "I've done everything but heroin; last drug use was marijuana in ~ 03/2018"    Sexual activity: Not Currently  Other Topics Concern   Not on file  Social History Narrative   Lives at home with his sister.   Right-handed.   34oz of coffee, 64oz of tea per day.   Social Determinants of Health   Financial Resource Strain: Not on file  Food Insecurity: Not on file  Transportation Needs: Not on file  Physical Activity: Not on file  Stress: Not on file  Social Connections: Not on file  Intimate Partner Violence: Not on file      Levert FeinsteinYijun Briawna Carver, M.D. Ph.D.  [facility] [address], [address] [place], [place] [address] Ph: [phone] Fax: [phone]  CC:  Jim LikePotts, Anna S, NP [address] [place],  [place] [id number]  Jim LikePotts, Anna S, NP

## 2021-09-08 ENCOUNTER — Other Ambulatory Visit: Payer: Self-pay

## 2021-09-08 ENCOUNTER — Encounter: Payer: Self-pay | Admitting: Gastroenterology

## 2021-09-08 ENCOUNTER — Ambulatory Visit (INDEPENDENT_AMBULATORY_CARE_PROVIDER_SITE_OTHER): Payer: Medicaid Other | Admitting: Gastroenterology

## 2021-09-08 VITALS — BP 140/82 | HR 62 | Ht 66.0 in | Wt 142.2 lb

## 2021-09-08 DIAGNOSIS — K861 Other chronic pancreatitis: Secondary | ICD-10-CM

## 2021-09-08 DIAGNOSIS — K7030 Alcoholic cirrhosis of liver without ascites: Secondary | ICD-10-CM | POA: Diagnosis not present

## 2021-09-08 DIAGNOSIS — K859 Acute pancreatitis without necrosis or infection, unspecified: Secondary | ICD-10-CM | POA: Diagnosis not present

## 2021-09-08 DIAGNOSIS — K219 Gastro-esophageal reflux disease without esophagitis: Secondary | ICD-10-CM | POA: Diagnosis not present

## 2021-09-08 MED ORDER — PANTOPRAZOLE SODIUM 40 MG PO TBEC: 40.0000 mg | DELAYED_RELEASE_TABLET | Freq: Two times a day (BID) | ORAL | 11 refills | Status: DC

## 2021-09-08 NOTE — Patient Instructions (Addendum)
If you are age 61 or older, your body mass index should be between 23-30. Your Body mass index is 22.96 kg/m. If this is out of the aforementioned range listed, please consider follow up with your Primary Care Provider.  If you are age 71 or younger, your body mass index should be between 19-25. Your Body mass index is 22.96 kg/m. If this is out of the aformentioned range listed, please consider follow up with your Primary Care Provider.   __________________________________________________________  The [facility] providers would like to encourage you to use [facility] to communicate with providers for non-urgent requests or questions.  Due to long hold times on the telephone, sending your provider a message by [facility] may be a faster and more efficient way to get a response.  Please allow 48 business hours for a response.  Please remember that this is for non-urgent requests.   _________________________________________________________  Start a low salt and fluid restricted diet. Please chew foods especially meats and breads well and eat slowly  We have sent the following medications to your pharmacy for you to pick up at your convenience: Pantoprazole 40mg  twice daily  Continue taking your low dose Lasix and spironaloctone.  We have placed an order for you to have an Ultrasound in January 2023  You will be called with an appointment It was a pleasure to see you today!  February 2023, M.D.

## 2021-09-08 NOTE — Progress Notes (Signed)
Chief Complaint: FU  Referring Provider:  Earlyne Iba, NP      ASSESSMENT AND PLAN;   #1. GERD with H/O HH, severe recurrent eso stricture s/p frequent dilatations 11/2016, 07/10/2019 09/2019 (with steroid injection), 12/2019, 04/2020.  #2. ETOH liver cirrhosis with H/O ascites.  Mild HE with sl elevated ammonia. [place] 03/2028- mild cirrhosis, no ascites or splenomegaly.  Normal flow on Doppler. Nl AFP 03/2019. Alb 4.3 (03/2019)  #3. H/O UGI bleeding d/t erosive esophagitis s/p clipping and endoscopy x3 with severe coagulopathy/alcohol abuse (INR>9)and hypovolemic/septic shock s/p 10U PRBC. No Varices. (Aug 2019 @ [place])  #4. Chronic ETOH pancreatitis (quit ETOH Jun 30, 2018, when he moved to [facility]). MRI/MRCP 09/2018 -acute on chronic pancreatitis.  Possibly pancreas divisum.  Diffusely mildly dilated pancreatic duct. No PD stones/ masses. 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  No exocrine or endocrine pancreatic insufficiency clinically. H/O pancreatic pseudocyst s/p endoscopic drainage in past. Nl CA19-9, CEA 03/2019  #5.  H/O seizures (? ETOH withdrawal)  #6.  H/O multi-substance abuse (cocaine, marijuana, tobacco).  Quit since he moved to [facility].  Under strict care of his sister [name].  #7.  Comorbid conditions- SZ, anxiety/depression, family history of von Willebrand disease   Plan: -Continue protonix 60m po bid. [facility] [facility] he wants to get it done next yr. -Salt and fluid restricted diet.  -Continue low dose lasix and spironolactone. -Continue rest of the medications -I have instructed patient that he needs to chew foods especially meats and breads well and eat slowly.   HPI:    Todd Alegriais a 60y.o. male   For follow-up Doing very well Still having problems swallowing at times but not as bad. Would like to get repeat EGD with dilatation.  The EGD with dilatation with steroids  helped the most.  Has been compliant with his medications.  No nonsteroidals.  He has also been compliant with salt restriction.  No further alcohol.  Of note - has quit drinking all alcohol, quit all drugs, quit smoking since May 2019.  He was very sick in May with upper GI bleeding/alcohol withdrawal.  [name]   On lactulose 1/day with 1-2 softer BM/day. Rifaxamin not started as it was not approved.   Review of past medical records: -H/O UGI bleeding d/t erosive esophagitis s/p clipping and endoscopy x3 with severe coagulopathy/alcohol abuse (INR>9)and hypovolemic/septic shock s/p 10U PRBC. No Varices. (Aug 2019   [place] for [place] sigmoid diverticulosis (report in care everywhere).  No need for 10 years.  Last EGD 09/2020 - Benign-appearing esophageal stenosis. Dilated to 13 mm. Injected with steroids. - 4 cm hiatal hernia. - No specimens collected.  Past Medical History:  Diagnosis Date   Acute on chronic pancreatitis ([place]    hx of pancreatitis; nothing recently til today /notes 09/17/2018 (09/17/2018)   Acute stomach ulcer 07/2018   [place] dependence with withdrawal ([place] 080/16/5537  w/complications   Alcohol withdrawal ([place] 09/19/2016   Alcohol-induced chronic pancreatitis ([place] 09/26/2014; 148/27/0786  Alcoholic gastritis 175/44/9201  Alcoholic liver disease ([place] 07/21/2018   Altered mental status 05/06/2013   Anemia due to acute blood loss 07/2018   Anxiety attack    Ascites due to alcoholic hepatitis 000/71/2197  Chest pain 11/09/2013   "have had chest pains several times" (09/17/2018)  Chronic alcoholism ([place])    Archie Endo 09/17/2018   Coagulopathy ([place]) unknown   Family history of von Willebrand disease    "sister, [name]" (09/17/2018)   Gastrointestinal hemorrhage with melena unknown   GERD (gastroesophageal reflux disease)    Headache    "weekly" (09/17/2018)   History of blood  transfusion 06/2018   "10 units in 8 h; probably had some before" (09/17/2018)   History of cirrhosis of liver    /notes 09/17/2018   History of hiatal hernia    History of shingles    Hypertension    Hypokalemia 09/19/2016   Hypomagnesemia 09/19/2016   Irregular heart rate 07/2018   Pancreatic mass    pancreatic masses/notes 09/17/2018   Portal vein thrombosis 09/19/2016   Protein malnutrition ([place]) 07/02/2018   chronic   Scrotal edema 07/21/2018   Seizure ([place]) last 2018?   "since ~ 2002; several; controlled w/RX" (09/17/2018)/over 2 years /pt is weaning off meds   Shingles 10/2019   Subconjunctival hemorrhage of right eye 06/14/2016   Substance abuse ([place])    Tremor 09/26/2014    Past Surgical History:  Procedure Laterality Date   COLONOSCOPY     ESOPHAGEAL VARICE LIGATION  06/2018   ESOPHAGOGASTRODUODENOSCOPY  04/2019   HEMORRHOID SURGERY     INGUINAL HERNIA REPAIR Right X 2   PANCREAS SURGERY     "cyst"   UPPER GASTROINTESTINAL ENDOSCOPY      Family History  Problem Relation Age of Onset   Stroke Mother    Congestive Heart Failure Mother    Other Father        unsure of history    Alcohol abuse Maternal Uncle    Colon cancer Neg Hx    Esophageal cancer Neg Hx    Rectal cancer Neg Hx    Stomach cancer Neg Hx    Colon polyps Neg Hx     Social History   Tobacco Use   Smoking status: Former    Packs/day: 0.33    Years: 45.00    Pack years: 14.85    Types: Cigarettes    Quit date: 2019    Years since quitting: 3.8   Smokeless tobacco: Never   Tobacco comments:    tried smokeless 1 or 2 times but it wasn't his thing per patient  Vaping Use   Vaping Use: Former   Quit date: 07/09/2018  Substance Use Topics   Alcohol use: Not Currently    Comment:  "nothing since 06/30/2018"   Drug use: Not Currently    Types: Marijuana    Comment: "I've done everything but heroin; last drug use was marijuana in ~ 03/2018"     Current Outpatient Medications   Medication Sig Dispense Refill   Cholecalciferol (VITAMIN D3 PO) Take 1,000 Units by mouth daily.     Cyclobenzaprine HCl (FLEXERIL PO) Take 1 tablet by mouth every 12 (twelve) hours.     diclofenac (VOLTAREN) 75 MG EC tablet Take 75 mg by mouth 2 (two) times daily as needed.     folic acid (FOLVITE) 1 MG tablet Take 1 tablet (1 mg total) by mouth daily. 90 tablet 1   furosemide (LASIX) 20 MG tablet Take 1 tablet (20 mg total) by mouth daily. 90 tablet 1   gabapentin (NEURONTIN) 300 MG capsule Take 1 capsule (300 mg total) by mouth 2 (two) times daily. 60 capsule 11   ibuprofen (ADVIL) 200 MG tablet Take 200 mg by mouth every 6 (six) hours as needed.  lactulose (CHRONULAC) 10 GM/15ML solution Take 15 mLs (10 g total) by mouth daily. 1892 mL 2   levETIRAcetam (KEPPRA) 500 MG tablet Take 1 tablet (500 mg total) by mouth 2 (two) times daily. 180 tablet 4   metoprolol tartrate (LOPRESSOR) 25 MG tablet Take 1 tablet (25 mg total) by mouth 2 (two) times daily. 180 tablet 1   pantoprazole (PROTONIX) 40 MG tablet TAKE ONE TABLET BY MOUTH TWICE DAILY BEFORE MEALS 60 tablet 3   spironolactone (ALDACTONE) 25 MG tablet Take 1 tablet (25 mg total) by mouth daily. 90 tablet 1   therapeutic multivitamin-minerals (THERAGRAN-M) tablet Take 1 tablet by mouth daily. 30 tablet 6   thiamine 100 MG tablet Take 1 tablet (100 mg total) by mouth daily. 30 tablet 6   No current facility-administered medications for this visit.    Allergies  Allergen Reactions   Morphine And Related Other (See Comments)    Unknown per pt, states he was out of it.    Peach [Prunus Persica] Hives    Fresh peaches    Review of Systems:       Physical Exam:    BP 140/82   Pulse 62   Ht _0  (1.676 m)   Wt 142 lb 4 oz (64.5 kg)   SpO2 99%   BMI 22.96 kg/m  Filed Weights   09/08/21 1118  Weight: 142 lb 4 oz (64.5 kg)   Gen: awake, alert, NAD HEENT: anicteric, no pallor CV: RRR, no mrg Pulm: CTA b/l Abd: soft,  NT/ND, +BS throughout Ext: no c/c/e Neuro: nonfocal  Data Reviewed: I have personally reviewed following labs and imaging studies  CBC: CBC Latest Ref Rng & Units 08/24/2020 04/01/2019 10/06/2018  WBC 3.4 - 10.8 x10E3/uL 6.3 6.9 9.7  Hemoglobin 13.0 - 17.7 g/dL 12.6(L) 13.0 12.9(L)  Hematocrit 37.5 - 51.0 % 36.6(L) 37.9(L) 38.0  Platelets 150 - 450 x10E3/uL 186 203.0 265    CMP: CMP Latest Ref Rng & Units 08/24/2020 04/01/2019 10/06/2018  Glucose 65 - 99 mg/dL 79 82 61(L)  BUN 6 - 24 mg/dL _1 Creatinine 0.76 - 1.27 mg/dL 0.89 0.85 0.58(L)  Sodium 134 - 144 mmol/L 136 137 137  Potassium 3.5 - 5.2 mmol/L 4.7 3.9 4.2  Chloride 96 - 106 mmol/L 98 102 97  CO2 20 - 29 mmol/L _2 Calcium 8.7 - 10.2 mg/dL 9.8 9.2 9.3  Total Protein 6.0 - 8.5 g/dL 7.7 7.6 7.2  Total Bilirubin 0.0 - 1.2 mg/dL 0.5 0.4 0.3  Alkaline Phos 44 - 121 IU/L 92 97 116  AST 0 - 40 IU/L 26 29 33  ALT 0 - 44 IU/L _3 Hepatic Function Latest Ref Rng & Units 08/24/2020 04/01/2019 10/06/2018  Total Protein 6.0 - 8.5 g/dL 7.7 7.6 7.2  Albumin 3.8 - 4.9 g/dL 4.9 4.3 3.7  AST 0 - 40 IU/L 26 29 33  ALT 0 - 44 IU/L _4 Alk Phosphatase 44 - 121 IU/L 92 97 116  Total Bilirubin 0.0 - 1.2 mg/dL 0.5 0.4 0.3      Carmell Austria, MD 09/08/2021, 11:28 AM  Cc: Earlyne Iba, NP

## 2021-10-06 ENCOUNTER — Ambulatory Visit (HOSPITAL_BASED_OUTPATIENT_CLINIC_OR_DEPARTMENT_OTHER): Payer: Medicaid Other

## 2021-10-19 ENCOUNTER — Encounter: Payer: Medicaid Other | Admitting: Gastroenterology

## 2021-11-13 ENCOUNTER — Encounter: Payer: Self-pay | Admitting: Gastroenterology

## 2021-11-13 ENCOUNTER — Ambulatory Visit (AMBULATORY_SURGERY_CENTER): Payer: Medicaid Other | Admitting: Gastroenterology

## 2021-11-13 ENCOUNTER — Other Ambulatory Visit: Payer: Self-pay

## 2021-11-13 VITALS — BP 128/69 | HR 56 | Temp 98.6°F | Resp 12 | Ht 66.0 in | Wt 142.0 lb

## 2021-11-13 DIAGNOSIS — R1310 Dysphagia, unspecified: Secondary | ICD-10-CM | POA: Diagnosis not present

## 2021-11-13 DIAGNOSIS — K222 Esophageal obstruction: Secondary | ICD-10-CM

## 2021-11-13 DIAGNOSIS — K449 Diaphragmatic hernia without obstruction or gangrene: Secondary | ICD-10-CM | POA: Diagnosis not present

## 2021-11-13 DIAGNOSIS — Z8719 Personal history of other diseases of the digestive system: Secondary | ICD-10-CM

## 2021-11-13 DIAGNOSIS — K7030 Alcoholic cirrhosis of liver without ascites: Secondary | ICD-10-CM

## 2021-11-13 DIAGNOSIS — K219 Gastro-esophageal reflux disease without esophagitis: Secondary | ICD-10-CM

## 2021-11-13 HISTORY — PX: UPPER GASTROINTESTINAL ENDOSCOPY: SHX188

## 2021-11-13 MED ORDER — SODIUM CHLORIDE 0.9 % IV SOLN: 500.0000 mL | INTRAVENOUS | Status: DC

## 2021-11-13 NOTE — Op Note (Signed)
[place] Patient Name: Todd Coffey Procedure Date: 11/13/2021 9:51 AM MRN: [id number] Endoscopist: Jackquline Denmark , MD Age: 61 Referring MD:  Date of Birth: November 15, 1960 Gender: Male Account #: [id number] Procedure:                Upper GI endoscopy Indications:              Dysphagia with H/O esophageal stricture. Medicines:                Monitored Anesthesia Care Procedure:                Pre-Anesthesia Assessment:                           - Prior to the procedure, a History and Physical                            was performed, and patient medications and                            allergies were reviewed. The patient's tolerance of                            previous anesthesia was also reviewed. The risks                            and benefits of the procedure and the sedation                            options and risks were discussed with the patient.                            All questions were answered, and informed consent                            was obtained. Prior Anticoagulants: The patient has                            taken no previous anticoagulant or antiplatelet                            agents. ASA Grade Assessment: III - A patient with                            severe systemic disease. After reviewing the risks                            and benefits, the patient was deemed in                            satisfactory condition to undergo the procedure.                           After obtaining informed consent, the endoscope was  passed under direct vision. Throughout the                            procedure, the patient's blood pressure, pulse, and                            oxygen saturations were monitored continuously. The                            Endoscope was introduced through the mouth, and                            advanced to the second part of duodenum. The upper                            GI endoscopy  was accomplished without difficulty.                            The patient tolerated the procedure well. Scope In: Scope Out: Findings:                 One benign-appearing, intrinsic moderate                            (circumferential scarring or stenosis; an endoscope                            may pass) stenosis was found 35 cm from the                            incisors. This stenosis measured 1 cm (inner                            diameter). The stenosis was traversed. A TTS                            dilator was passed through the scope. Dilation with                            a 13.5-14.5-15.5 mm balloon dilator was performed                            to 15.5 mm. The dilation site was examined and                            showed moderate mucosal disruption and moderate                            improvement in luminal narrowing. Biopsies were not                            performed since previous biopsies were negative for  EoE/Barrett's.                           A 4 cm hiatal hernia was present.                           The exam was otherwise without abnormality. Complications:            No immediate complications. Estimated Blood Loss:     Estimated blood loss: none. Impression:               - Benign-appearing esophageal stenosis. Dilated.                           - 4 cm hiatal hernia.                           - The examination was otherwise normal.                           - No specimens collected. Recommendation:           - Patient has a contact number available for                            emergencies. The signs and symptoms of potential                            delayed complications were discussed with the                            patient. Return to normal activities tomorrow.                            Written discharge instructions were provided to the                            patient.                           - Post  dilatation diet.                           - Continue present medications including Protonix.                           - Chew foods especially meats and breads well and                            eat slowly.                           - Repeat dilatation as needed basis.                           - The findings and recommendations were discussed  with the patient's family. Jackquline Denmark, MD 11/13/2021 10:22:49 AM This report has been signed electronically.

## 2021-11-13 NOTE — Progress Notes (Signed)
Called to room to assist during endoscopic procedure.  Patient ID and intended procedure confirmed with present staff. Received instructions for my participation in the procedure from the performing physician.  

## 2021-11-13 NOTE — Progress Notes (Signed)
Pt in recovery with monitors in place, VSS. Report given to receiving RN. Bite guard was placed with pt awake to ensure comfort. No dental or soft tissue damage noted. 

## 2021-11-13 NOTE — Progress Notes (Signed)
Pt's states no medical or surgical changes since previsit or office visit. 

## 2021-11-13 NOTE — Progress Notes (Signed)
° ° °Chief Complaint: FU ° °Referring Provider:  Potts, Anna S, NP    ° ° °ASSESSMENT AND PLAN;  ° °#1. GERD with H/O HH, severe recurrent eso stricture s/p frequent dilatations 11/2016, 07/10/2019 09/2019 (with steroid injection), 12/2019, 04/2020. ° °#2. ETOH liver cirrhosis with H/O ascites.  Mild HE with sl elevated ammonia. US 03/2028- mild cirrhosis, no ascites or splenomegaly.  Normal flow on Doppler. Nl AFP 03/2019. Alb 4.3 (03/2019) ° °#3. H/O UGI bleeding d/t erosive esophagitis s/p clipping and endoscopy x3 with severe coagulopathy/alcohol abuse (INR>9)and hypovolemic/septic shock s/p 10U PRBC. No Varices. (Aug 2019 @ [place]) ° °#4. Chronic ETOH pancreatitis (quit ETOH Jun 30, 2018, when he moved to [place]). MRI/MRCP 09/2018 -acute on chronic pancreatitis.  Possibly pancreas divisum.  Diffusely mildly dilated pancreatic duct. No PD stones/ masses. 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  No exocrine or endocrine pancreatic insufficiency clinically. H/O pancreatic pseudocyst s/p endoscopic drainage in past. Nl CA19-9, CEA 03/2019 ° °#5.  H/O seizures (? ETOH withdrawal) ° °#6.  H/O multi-substance abuse (cocaine, marijuana, tobacco).  Quit since he moved to [place].  Under strict care of his sister [name]. ° °#7.  Comorbid conditions- SZ, anxiety/depression, family history of von Willebrand disease ° ° °Plan: °-Continue protonix 40mg po bid. °-Rpt EGD at  [facility] Jan 2023. °-US abdo for HCC screening- he wants to get it done next yr. °-Salt and fluid restricted diet.  °-Continue low dose lasix and spironolactone. °-Continue rest of the medications °-I have instructed patient that he needs to chew foods especially meats and breads well and eat slowly. ° ° °HPI:   ° °Todd Coffey is a 61 y.o. male  ° °For follow-up °Doing very well °Still having problems swallowing at times but not as bad. °Would like to get repeat EGD with dilatation.  The EGD with dilatation with steroids  helped the most. ° °Has been compliant with his medications.  No nonsteroidals.  He has also been compliant with salt restriction.  No further alcohol. ° °Of note - has quit drinking all alcohol, quit all drugs, quit smoking since May 2019.  He was very sick in May with upper GI bleeding/alcohol withdrawal.  [name] (sister) made a move to [place].  ° °On lactulose 1/day with 1-2 softer BM/day. Rifaxamin not started as it was not approved. ° ° °Review of past medical records: °-H/O UGI bleeding d/t erosive esophagitis s/p clipping and endoscopy x3 with severe coagulopathy/alcohol abuse (INR>9)and hypovolemic/septic shock s/p 10U PRBC. No Varices. (Aug 2019  ° °colon - neg 08[place] - neg except for mod sigmoid diverticulosis (report in care everywhere).  No need for 10 years. ° °Last EGD 09/2020 °- Benign-appearing esophageal stenosis. Dilated to 13 mm. Injected with steroids. °- 4 cm hiatal hernia. °- No specimens collected. ° °Past Medical History:  °Diagnosis Date  ° Acute on chronic pancreatitis (H[facility])   ° hx of pancreatitis; nothing recently til today /notes 09/17/2018 (09/17/2018)  ° Acute stomach ulcer 07/2018  ° Alcohol dependence with withdrawal ([facility]) 07/02/2018  ° w/complications  ° Alcohol withdrawal ([facility]) 09/19/2016  ° Alcohol-induced chronic pancreatitis ([facility]) 09/26/2014; 09/19/2016  ° Alcoholic gastritis 09/19/2016  ° Alcoholic liver disease (H[facility]) 07/21/2018  ° Altered mental status 05/06/2013  ° Anemia due to acute blood loss 07/2018  ° Anxiety attack   ° Ascites due to alcoholic hepatitis 07/21/2018  ° Chest pain 11/09/2013  ° "have had chest pains several times" (09/17/2018)  °   09/17/2018)   Chronic alcoholism ([place])    Archie Endo 09/17/2018   Coagulopathy ([place]) unknown   Family history of von Willebrand disease    "sister, [name]" (09/17/2018)   Gastrointestinal hemorrhage with melena unknown   GERD (gastroesophageal reflux disease)    Headache    "weekly" (09/17/2018)   History of blood  transfusion 06/2018   "10 units in 8 h; probably had some before" (09/17/2018)   History of cirrhosis of liver    /notes 09/17/2018   History of hiatal hernia    History of shingles    Hypertension    Hypokalemia 09/19/2016   Hypomagnesemia 09/19/2016   Irregular heart rate 07/2018   Pancreatic mass    pancreatic masses/notes 09/17/2018   Portal vein thrombosis 09/19/2016   Protein malnutrition ([place]) 07/02/2018   chronic   Scrotal edema 07/21/2018   Seizure ([place]) last 2018?   "since ~ 2002; several; controlled w/RX" (09/17/2018)/over 2 years /pt is weaning off meds   Shingles 10/2019   Subconjunctival hemorrhage of right eye 06/14/2016   Substance abuse ([place])    Tremor 09/26/2014    Past Surgical History:  Procedure Laterality Date   COLONOSCOPY     ESOPHAGEAL VARICE LIGATION  06/2018   ESOPHAGOGASTRODUODENOSCOPY  04/2019   HEMORRHOID SURGERY     INGUINAL HERNIA REPAIR Right X 2   PANCREAS SURGERY     "cyst"   UPPER GASTROINTESTINAL ENDOSCOPY      Family History  Problem Relation Age of Onset   Stroke Mother    Congestive Heart Failure Mother    Other Father        unsure of history    Alcohol abuse Maternal Uncle    Colon cancer Neg Hx    Esophageal cancer Neg Hx    Rectal cancer Neg Hx    Stomach cancer Neg Hx    Colon polyps Neg Hx     Social History   Tobacco Use   Smoking status: Former    Packs/day: 0.33    Years: 45.00    Pack years: 14.85    Types: Cigarettes    Quit date: 2019    Years since quitting: 4.0   Smokeless tobacco: Never   Tobacco comments:    tried smokeless 1 or 2 times but it wasn't his thing per patient  Vaping Use   Vaping Use: Former   Quit date: 07/09/2018  Substance Use Topics   Alcohol use: Not Currently    Comment:  "nothing since 06/30/2018"   Drug use: Not Currently    Types: Marijuana    Comment: "I've done everything but heroin; last drug use was marijuana in ~ 03/2018"     Current Outpatient Medications   Medication Sig Dispense Refill   Cholecalciferol (VITAMIN D3 PO) Take 1,000 Units by mouth daily.     Cyclobenzaprine HCl (FLEXERIL PO) Take 1 tablet by mouth every 12 (twelve) hours.     diclofenac (VOLTAREN) 75 MG EC tablet Take 75 mg by mouth 2 (two) times daily as needed.     folic acid (FOLVITE) 1 MG tablet Take 1 tablet (1 mg total) by mouth daily. 90 tablet 1   furosemide (LASIX) 20 MG tablet Take 1 tablet (20 mg total) by mouth daily. 90 tablet 1   gabapentin (NEURONTIN) 300 MG capsule Take 1 capsule (300 mg total) by mouth 2 (two) times daily. 60 capsule 11   ibuprofen (ADVIL) 200 MG tablet Take 200 mg by mouth every 6 (six) hours  as needed.     lactulose (CHRONULAC) 10 GM/15ML solution Take 15 mLs (10 g total) by mouth daily. 1892 mL 2   levETIRAcetam (KEPPRA) 500 MG tablet Take 1 tablet (500 mg total) by mouth 2 (two) times daily. 180 tablet 4   metoprolol tartrate (LOPRESSOR) 25 MG tablet Take 1 tablet (25 mg total) by mouth 2 (two) times daily. 180 tablet 1   spironolactone (ALDACTONE) 25 MG tablet Take 1 tablet (25 mg total) by mouth daily. 90 tablet 1   therapeutic multivitamin-minerals (THERAGRAN-M) tablet Take 1 tablet by mouth daily. 30 tablet 6   thiamine 100 MG tablet Take 1 tablet (100 mg total) by mouth daily. 30 tablet 6   pantoprazole (PROTONIX) 40 MG tablet Take 1 tablet (40 mg total) by mouth 2 (two) times daily. 60 tablet 11   Current Facility-Administered Medications  Medication Dose Route Frequency Provider Last Rate Last Admin   0.9 %  sodium chloride infusion  500 mL Intravenous Continuous Jackquline Denmark, MD        Allergies  Allergen Reactions   Morphine And Related Other (See Comments)    Unknown per pt, states he was out of it.    Peach [Prunus Persica] Hives    Fresh peaches    Review of Systems:       Physical Exam:    BP 137/77    Pulse 60    Temp 98.6 F (37 C) (Temporal)    Ht 5' 6" (1.676 m)    Wt 142 lb (64.4 kg)    SpO2 100%    BMI 22.92  kg/m  Filed Weights   11/13/21 0932  Weight: 142 lb (64.4 kg)   Gen: awake, alert, NAD HEENT: anicteric, no pallor CV: RRR, no mrg Pulm: CTA b/l Abd: soft, NT/ND, +BS throughout Ext: no c/c/e Neuro: nonfocal  Data Reviewed: I have personally reviewed following labs and imaging studies  CBC: CBC Latest Ref Rng & Units 08/24/2020 04/01/2019 10/06/2018  WBC 3.4 - 10.8 x10E3/uL 6.3 6.9 9.7  Hemoglobin 13.0 - 17.7 g/dL 12.6(L) 13.0 12.9(L)  Hematocrit 37.5 - 51.0 % 36.6(L) 37.9(L) 38.0  Platelets 150 - 450 x10E3/uL 186 203.0 265    CMP: CMP Latest Ref Rng & Units 08/24/2020 04/01/2019 10/06/2018  Glucose 65 - 99 mg/dL 79 82 61(L)  BUN 6 - 24 mg/dL _0 Creatinine 0.76 - 1.27 mg/dL 0.89 0.85 0.58(L)  Sodium 134 - 144 mmol/L 136 137 137  Potassium 3.5 - 5.2 mmol/L 4.7 3.9 4.2  Chloride 96 - 106 mmol/L 98 102 97  CO2 20 - 29 mmol/L _1 Calcium 8.7 - 10.2 mg/dL 9.8 9.2 9.3  Total Protein 6.0 - 8.5 g/dL 7.7 7.6 7.2  Total Bilirubin 0.0 - 1.2 mg/dL 0.5 0.4 0.3  Alkaline Phos 44 - 121 IU/L 92 97 116  AST 0 - 40 IU/L 26 29 33  ALT 0 - 44 IU/L _2 Hepatic Function Latest Ref Rng & Units 08/24/2020 04/01/2019 10/06/2018  Total Protein 6.0 - 8.5 g/dL 7.7 7.6 7.2  Albumin 3.8 - 4.9 g/dL 4.9 4.3 3.7  AST 0 - 40 IU/L 26 29 33  ALT 0 - 44 IU/L _3 Alk Phosphatase 44 - 121 IU/L 92 97 116  Total Bilirubin 0.0 - 1.2 mg/dL 0.5 0.4 0.3      Carmell Austria, MD 11/13/2021, 9:55 AM  Cc: Earlyne Iba, NP

## 2021-11-13 NOTE — Patient Instructions (Signed)
Dilation diet hang out provided Please follow the instruction on the handout. You can Resume you regular diet tomorrow.Chew foods especially meats and breads well and eat slowly.   Hiatal Hernia handout provided   YOU HAD AN ENDOSCOPIC PROCEDURE TODAY AT THE [facility]:   Refer to the procedure report that was given to you for any specific questions about what was found during the examination.  If the procedure report does not answer your questions, please call your gastroenterologist to clarify.  If you requested that your care partner not be given the details of your procedure findings, then the procedure report has been included in a sealed envelope for you to review at your convenience later.  YOU SHOULD EXPECT: Some feelings of bloating in the abdomen. Passage of more gas than usual.  Walking can help get rid of the air that was put into your GI tract during the procedure and reduce the bloating. If you had a lower endoscopy (such as a colonoscopy or flexible sigmoidoscopy) you may notice spotting of blood in your stool or on the toilet paper. If you underwent a bowel prep for your procedure, you may not have a normal bowel movement for a few days.  Please Note:  You might notice some irritation and congestion in your nose or some drainage.  This is from the oxygen used during your procedure.  There is no need for concern and it should clear up in a day or so.  SYMPTOMS TO REPORT IMMEDIATELY:   Following upper endoscopy (EGD)  Vomiting of blood or coffee ground material  New chest pain or pain under the shoulder blades  Painful or persistently difficult swallowing  New shortness of breath  Fever of 100F or higher  Black, tarry-looking stools  For urgent or emergent issues, a gastroenterologist can be reached at any hour by calling [phone]. Do not use MyChart messaging for urgent concerns.    DIET:  We do recommend Dialtions diet today.  but then you may proceed to  your regular diet tomorrow. Drink plenty of fluids but you should avoid alcoholic beverages for 24 hours.  ACTIVITY:  You should plan to take it easy for the rest of today and you should NOT DRIVE or use heavy machinery until tomorrow (because of the sedation medicines used during the test).    FOLLOW UP: Our staff will call the number listed on your records 48-72 hours following your procedure to check on you and address any questions or concerns that you may have regarding the information given to you following your procedure. If we do not reach you, we will leave a message.  We will attempt to reach you two times.  During this call, we will ask if you have developed any symptoms of COVID 19. If you develop any symptoms (ie: fever, flu-like symptoms, shortness of breath, cough etc.) before then, please call [phone].  If you test positive for Covid 19 in the 2 weeks post procedure, please call and report this information to [place].    If any biopsies were taken you will be contacted by phone or by letter within the next 1-3 weeks.  Please call us at [phone] if you have not heard about the biopsies in 3 weeks.    SIGNATURES/CONFIDENTIALITY: You and/or your care partner have signed paperwork which will be entered into your electronic medical record.  These signatures attest to the fact that that the information above on your After Visit Summary has  been reviewed and is understood.  Full responsibility of the confidentiality of this discharge information lies with you and/or your care-partner.

## 2021-11-15 ENCOUNTER — Telehealth: Payer: Self-pay | Admitting: *Deleted

## 2021-11-15 NOTE — Telephone Encounter (Signed)
°  Follow up Call-  Call back number 11/13/2021 09/16/2020 04/29/2020 12/18/2019 09/11/2019 07/10/2019 04/16/2019  Post procedure Call Back phone  # [phone] Tami [phone] (Pt.'s sister [name].  Pt. lives with sister) [phone]  Permission to leave phone message Yes Yes Yes Yes Yes Yes Yes  Some recent data might be hidden     Patient questions:  Do you have a fever, pain , or abdominal swelling? No. Pain Score  0 *  Have you tolerated food without any problems? Yes.    Have you been able to return to your normal activities? Yes.    Do you have any questions about your discharge instructions: Diet   No. Medications  No. Follow up visit  No.  Do you have questions or concerns about your Care? No.  Actions: * If pain score is 4 or above: No action needed, pain <4.

## 2022-04-18 IMAGING — US US ABDOMEN COMPLETE
1 series · 13 of 25 positions shown · non-contrast
Comparison: April 03, 2019

CLINICAL DATA: Alcoholic cirrhosis.

EXAM:
ABDOMEN ULTRASOUND COMPLETE

[Series 1: us abdomen complete · 13 of 64 slices shown]
[im 1/64]
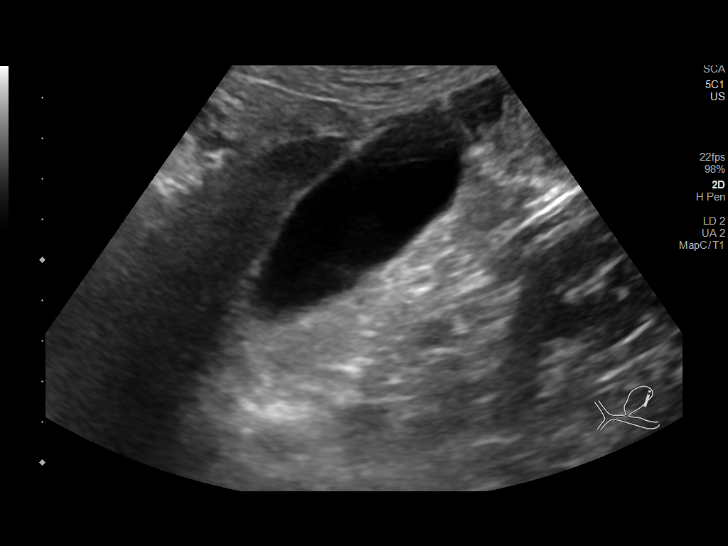
[im 6/64]
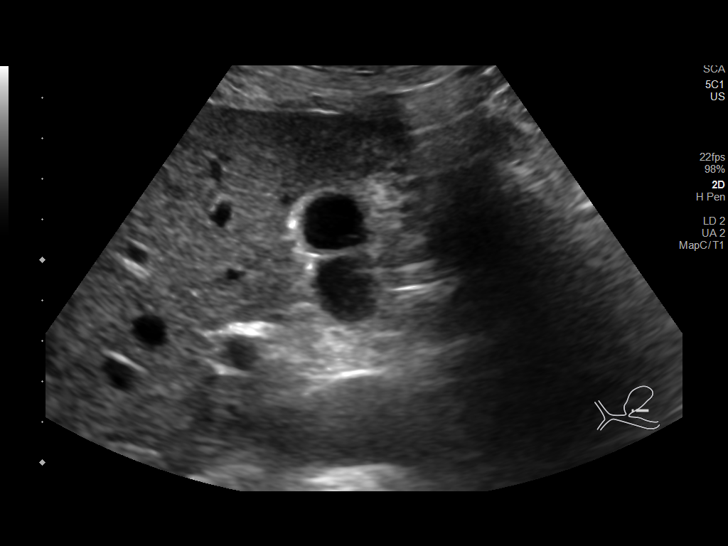
[im 11/64]
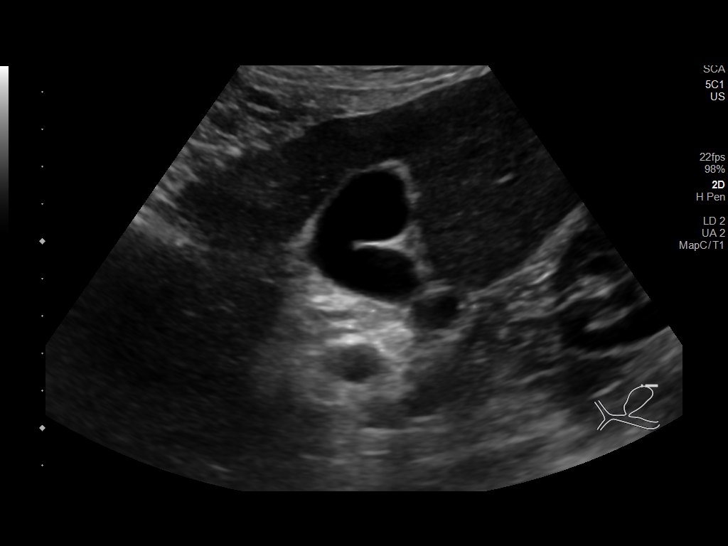
[im 16/64]
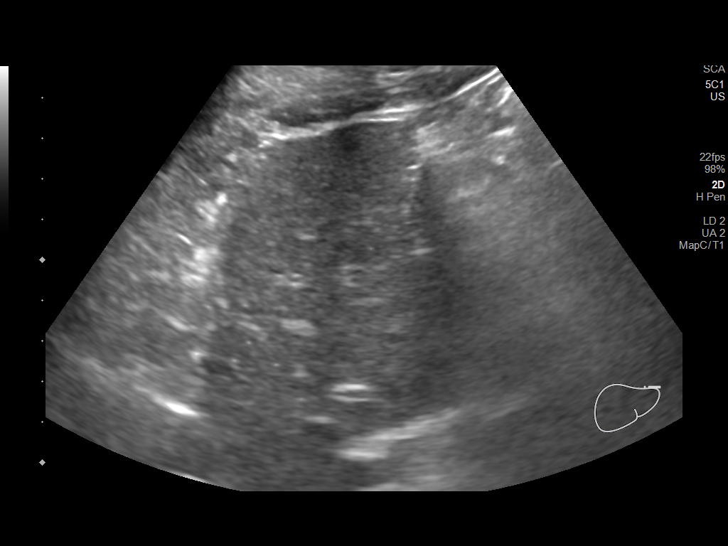
[im 22/64]
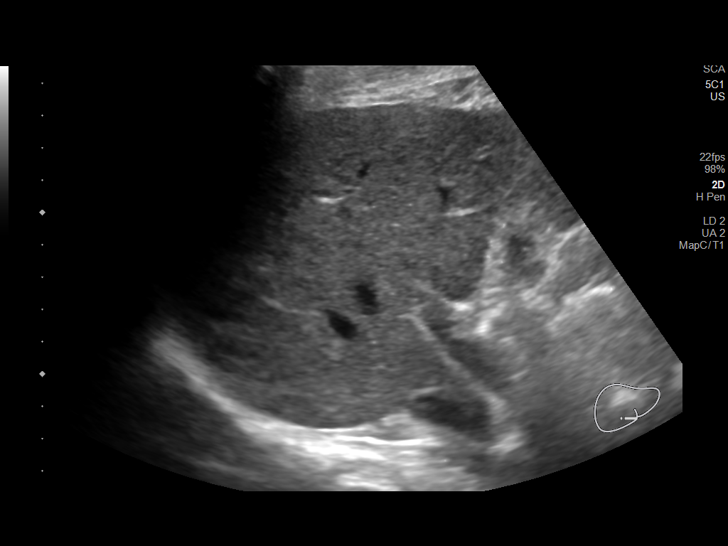
[im 27/64]
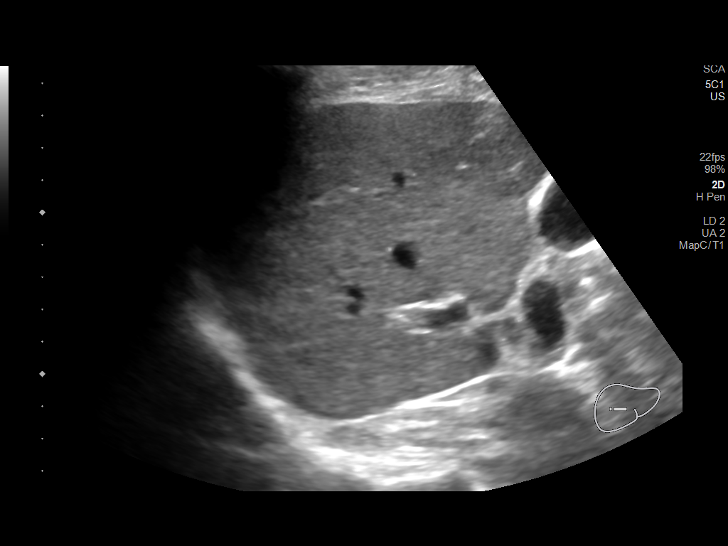
[im 32/64]
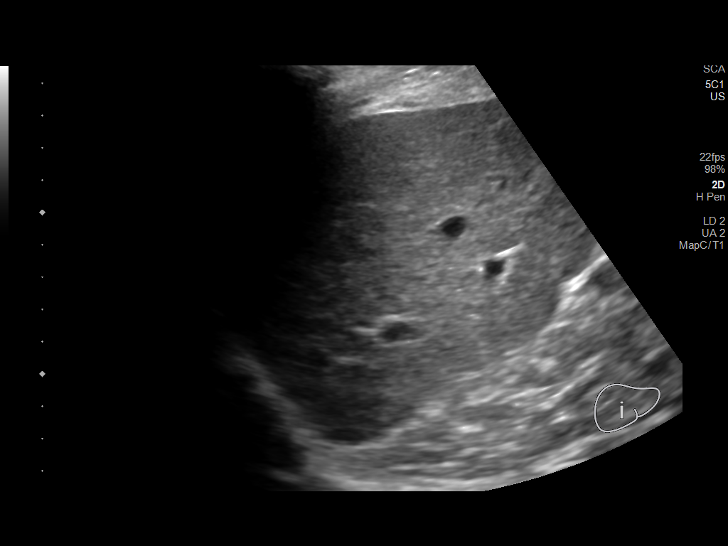
[im 37/64]
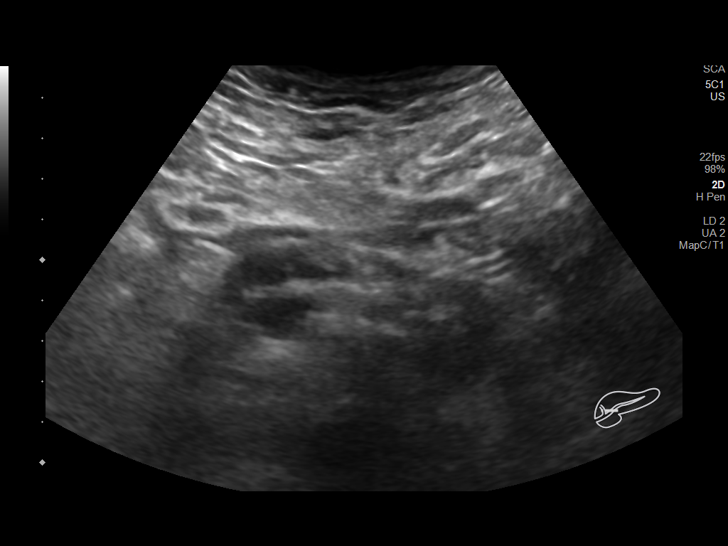
[im 43/64]
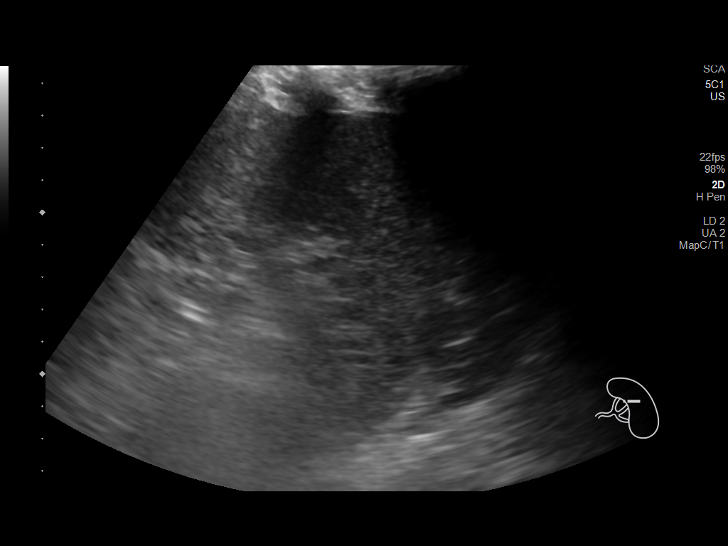
[im 48/64]
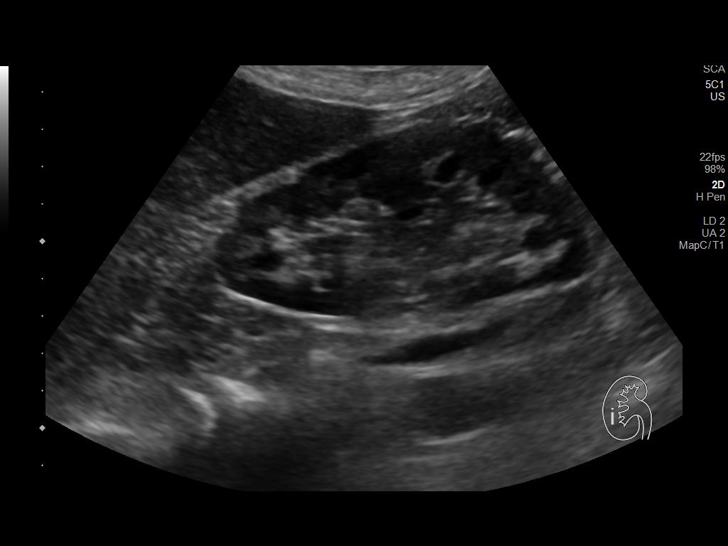
[im 53/64]
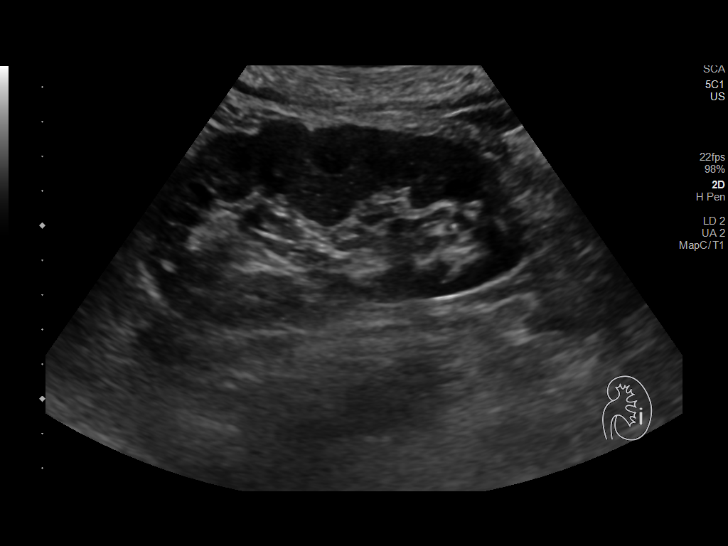
[im 58/64]
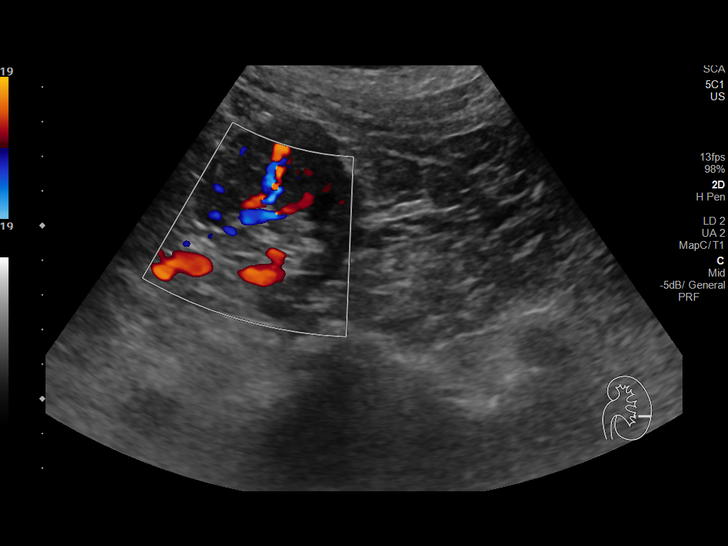
[im 64/64]
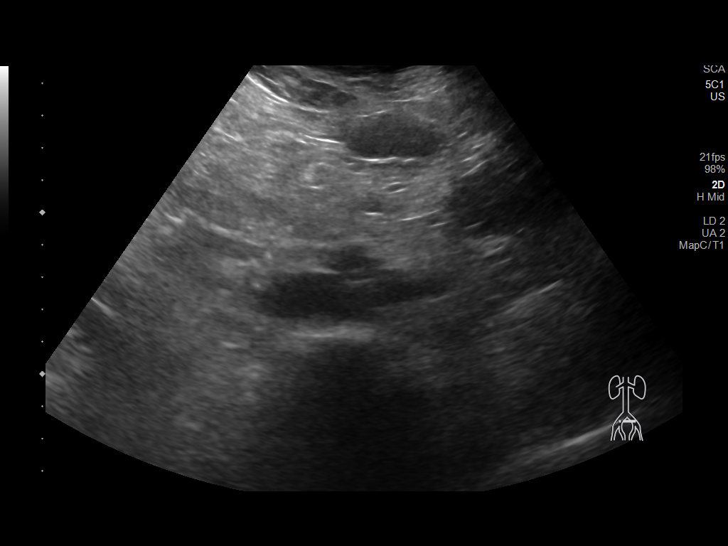

[13 of 25 positions shown; findings below may reference images not displayed]

FINDINGS: Gallbladder: No gallstones or wall thickening visualized. No
sonographic Murphy sign noted by sonographer.

Common bile duct: Diameter: 2.3 mm

Liver: No focal lesion identified. Coarse attenuation of the hepatic
parenchyma with mild nodularity of the contour. Portal vein is
patent on color Doppler imaging with normal direction of blood flow
towards the liver.

IVC: No abnormality visualized.

Pancreas: Abnormal appearance of the visualized portion of the
pancreas with marked dilation of the main pancreatic duct measuring
1.1 cm and atrophic appearance of the pancreatic parenchyma

Spleen: Size and appearance within normal limits.

Right Kidney: Length: 10.6 cm. Echogenicity within normal limits. No
mass or hydronephrosis visualized.

Left Kidney: Length: 10.8 cm. Echogenicity within normal limits. No
mass or hydronephrosis visualized.

Abdominal aorta: No aneurysm visualized.

Other findings: None.
IMPRESSION: 1. Coarse attenuation of the hepatic parenchyma with mild nodularity
of the hepatic contour, consistent with early cirrhotic changes. No
evidence of splenomegaly or abdominal ascites.
2. Known dilation of the main pancreatic duct, with atrophic
appearance of the pancreatic parenchyma, better visualized on
cross-sectional imaging.

## 2022-10-30 ENCOUNTER — Other Ambulatory Visit: Payer: Self-pay

## 2022-10-30 MED ORDER — PANTOPRAZOLE SODIUM 40 MG PO TBEC: 40.0000 mg | DELAYED_RELEASE_TABLET | Freq: Two times a day (BID) | ORAL | 11 refills | Status: DC

## 2023-03-27 ENCOUNTER — Telehealth: Payer: Self-pay | Admitting: Gastroenterology

## 2023-03-27 NOTE — Telephone Encounter (Signed)
Patient's sister called stating patient is having a very hard time swallowing. She states that he has his esophagus stretched on a regular basis. Scheduled for the next appointment 08/07, requesting a call back to discuss what could be down before then. Please advise, thank you.

## 2023-03-28 NOTE — Telephone Encounter (Signed)
Spoke with patient's sister & she stated patient has been having difficulty swallowing, and feels that he needs to be stretched again. Denies any other symptoms. Currently on pantoprazole 40 mg BID as prescribed. Moved patient to sooner OV with Amy, PA on 04/04/23 at 2:30 pm. Advised patient to continue to eat soft foods & chew food very well in the meantime. Sister verbalized all understanding.

## 2023-04-04 ENCOUNTER — Telehealth: Payer: Self-pay

## 2023-04-04 ENCOUNTER — Ambulatory Visit: Payer: Medicaid Other | Admitting: Physician Assistant

## 2023-04-04 NOTE — Telephone Encounter (Signed)
Patient was scheduled to see Mike Gip PA today, provider is ill.  Patient's chart was reviewed with Dr. Chales Abrahams.  Patient is well known to Dr. Chales Abrahams and has a hx of esophageal stricture that requires dilation.  Per Dr. Chales Abrahams okay to cancel OV and schedule EGD dilation directly in the [facility].  Dr. Chales Abrahams requests that he have Kenalog available if needed for injection.  Lynnda Child, Asst Director of [facility] notified.  Patient has been scheduled for a direct EGD dil and pre-visit for 6/28 11:00 and 7/9 4:00. This is scheduled with the patient's sister.

## 2023-05-03 ENCOUNTER — Ambulatory Visit (AMBULATORY_SURGERY_CENTER): Payer: Medicaid Other

## 2023-05-03 VITALS — Ht 66.0 in | Wt 137.0 lb

## 2023-05-03 DIAGNOSIS — R1310 Dysphagia, unspecified: Secondary | ICD-10-CM

## 2023-05-03 NOTE — Progress Notes (Signed)
No egg  allergy known to patient   No issues known to pt with past sedation  with any surgeries or procedures  Patient denies ever being told they had issues or difficulty with intubation   No FH of Malignant Hyperthermia  Pt is not on diet pills  Pt is not on  home 02   Pt is not on blood thinners   Pt denies issues with constipation   No A fib or A flutter  Have any cardiac testing pending--no  Pt instructed to use [employer] or GoodRx for a price reduction on prep   Patient's chart reviewed by John Nulty CRNA prior to previsit and patient appropriate for the [facility].  Previsit completed and red dot placed by patient's name on their procedure day (on provider's schedule).     

## 2023-05-14 ENCOUNTER — Encounter: Payer: Self-pay | Admitting: Gastroenterology

## 2023-05-14 ENCOUNTER — Ambulatory Visit: Payer: Medicaid Other | Admitting: Gastroenterology

## 2023-05-14 VITALS — BP 111/75 | HR 67 | Temp 98.0°F | Resp 14 | Ht 66.0 in | Wt 137.0 lb

## 2023-05-14 DIAGNOSIS — R1310 Dysphagia, unspecified: Secondary | ICD-10-CM

## 2023-05-14 DIAGNOSIS — K7030 Alcoholic cirrhosis of liver without ascites: Secondary | ICD-10-CM

## 2023-05-14 DIAGNOSIS — K222 Esophageal obstruction: Secondary | ICD-10-CM | POA: Diagnosis not present

## 2023-05-14 DIAGNOSIS — K2100 Gastro-esophageal reflux disease with esophagitis, without bleeding: Secondary | ICD-10-CM

## 2023-05-14 DIAGNOSIS — K219 Gastro-esophageal reflux disease without esophagitis: Secondary | ICD-10-CM

## 2023-05-14 MED ORDER — SODIUM CHLORIDE 0.9 % IV SOLN
500.00 mL | Freq: Once | INTRAVENOUS | Status: DC
Start: 2023-05-14 — End: 2023-05-14

## 2023-05-14 MED ORDER — PANTOPRAZOLE SODIUM 40 MG PO TBEC: 40.0000 mg | DELAYED_RELEASE_TABLET | Freq: Two times a day (BID) | ORAL | 4 refills | Status: DC

## 2023-05-14 NOTE — Patient Instructions (Signed)
YOU HAD AN ENDOSCOPIC PROCEDURE TODAY AT THE [facility]:   Refer to the procedure report that was given to you for any specific questions about what was found during the examination.  If the procedure report does not answer your questions, please call your gastroenterologist to clarify.  If you requested that your care partner not be given the details of your procedure findings, then the procedure report has been included in a sealed envelope for you to review at your convenience later.  **Handouts given on Dilation diet**  YOU SHOULD EXPECT: Some feelings of bloating in the abdomen. Passage of more gas than usual.  Walking can help get rid of the air that was put into your GI tract during the procedure and reduce the bloating. If you had a lower endoscopy (such as a colonoscopy or flexible sigmoidoscopy) you may notice spotting of blood in your stool or on the toilet paper. If you underwent a bowel prep for your procedure, you may not have a normal bowel movement for a few days.  Please Note:  You might notice some irritation and congestion in your nose or some drainage.  This is from the oxygen used during your procedure.  There is no need for concern and it should clear up in a day or so.  SYMPTOMS TO REPORT IMMEDIATELY:  Following upper endoscopy (EGD)  Vomiting of blood or coffee ground material  New chest pain or pain under the shoulder blades  Painful or persistently difficult swallowing  New shortness of breath  Fever of 100F or higher  Black, tarry-looking stools  For urgent or emergent issues, a gastroenterologist can be reached at any hour by calling [phone]. Do not use MyChart messaging for urgent concerns.    DIET:  We do recommend a small meal at first, but then you may proceed to your regular diet.  Drink plenty of fluids but you should avoid alcoholic beverages for 24 hours.  ACTIVITY:  You should plan to take it easy for the rest of today and you should  NOT DRIVE or use heavy machinery until tomorrow (because of the sedation medicines used during the test).    FOLLOW UP: Our staff will call the number listed on your records the next business day following your procedure.  We will call around 7:15- 8:00 am to check on you and address any questions or concerns that you may have regarding the information given to you following your procedure. If we do not reach you, we will leave a message.     If any biopsies were taken you will be contacted by phone or by letter within the next 1-3 weeks.  Please call us at [phone] if you have not heard about the biopsies in 3 weeks.    SIGNATURES/CONFIDENTIALITY: You and/or your care partner have signed paperwork which will be entered into your electronic medical record.  These signatures attest to the fact that that the information above on your After Visit Summary has been reviewed and is understood.  Full responsibility of the confidentiality of this discharge information lies with you and/or your care-partner.

## 2023-05-14 NOTE — Progress Notes (Signed)
Report to PACU, RN, vss, BBS= Clear.  

## 2023-05-14 NOTE — Progress Notes (Signed)
Chief Complaint: FU  Referring Provider:  Jim Like, NP      ASSESSMENT AND PLAN;   #1. GERD with H/O HH, severe recurrent eso stricture s/p frequent dilatations 11/2016, 07/10/2019 09/2019 (with steroid injection), 12/2019, 04/2020.  #2. ETOH liver cirrhosis with H/O ascites.  Mild HE with sl elevated ammonia. [place] 03/2028- mild cirrhosis, no ascites or splenomegaly.  Normal flow on Doppler. Nl AFP 03/2019. Alb 4.3 (03/2019)  #3. H/O UGI bleeding d/t erosive esophagitis s/p clipping and endoscopy x3 with severe coagulopathy/alcohol abuse (INR>9)and hypovolemic/septic shock s/p 10U PRBC. No Varices. (Aug 2019 @ [place])  #4. Chronic ETOH pancreatitis (quit ETOH Jun 30, 2018, when he moved to [facility]). MRI/MRCP 09/2018 -acute on chronic pancreatitis.  Possibly pancreas divisum.  Diffusely mildly dilated pancreatic duct. No PD stones/ masses. 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  No exocrine or endocrine pancreatic insufficiency clinically. H/O pancreatic pseudocyst s/p endoscopic drainage in past. Nl CA19-9, CEA 03/2019  #5.  H/O seizures (? ETOH withdrawal)  #6.  H/O multi-substance abuse (cocaine, marijuana, tobacco).  Quit since he moved to [facility].  Under strict care of his sister [name].  #7.  Comorbid conditions- SZ, anxiety/depression, family history of von Willebrand disease   Plan: -Continue protonix 40mg  po bid. -Rpt EGD at  [facility] Jan 2023. -[place] abdo for [facility] screening- he wants to get it done next yr. -Salt and fluid restricted diet.  -Continue low dose lasix and spironolactone. -Continue rest of the medications -I have instructed patient that he needs to chew foods especially meats and breads well and eat slowly.   HPI:    Todd Coffey is a 62 y.o. male   For follow-up Doing very well Still having problems swallowing at times but not as bad. Would like to get repeat EGD with dilatation.  The EGD with dilatation with steroids  helped the most.  Has been compliant with his medications.  No nonsteroidals.  He has also been compliant with salt restriction.  No further alcohol.  Of note - has quit drinking all alcohol, quit all drugs, quit smoking since May 2019.  He was very sick in May with upper GI bleeding/alcohol withdrawal.  [name] (sister) made a move to [place].   On lactulose 1/day with 1-2 softer BM/day. Rifaxamin not started as it was not approved.   Review of past medical records: -H/O UGI bleeding d/t erosive esophagitis s/p clipping and endoscopy x3 with severe coagulopathy/alcohol abuse (INR>9)and hypovolemic/septic shock s/p 10U PRBC. No Varices. (Aug 2019   colon - neg 08/2017 at [place], [place] - neg except for mod sigmoid diverticulosis (report in care everywhere).  No need for 10 years.  Last EGD 09/2020 - Benign-appearing esophageal stenosis. Dilated to 13 mm. Injected with steroids. - 4 cm hiatal hernia. - No specimens collected.  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   Alcoholic liver disease (HCC) 07/21/2018   Altered mental status 05/06/2013   Anemia due to acute blood loss 07/2018   Anxiety attack    Ascites due to alcoholic hepatitis 07/21/2018   Chest pain 11/09/2013   "have had chest pains several times" (09/17/2018)  Chronic alcoholism (HCC)    Hattie Perch 09/17/2018   Coagulopathy (HCC) unknown   Family history of von Willebrand disease    "sister, [name]" (09/17/2018)   Gastrointestinal hemorrhage with melena unknown   GERD (gastroesophageal reflux disease)    Headache    "weekly" (09/17/2018)   History of blood  transfusion 06/2018   "10 units in 8 h; probably had some before" (09/17/2018)   History of cirrhosis of liver    /notes 09/17/2018   History of hiatal hernia    History of shingles    Hypertension    Hypokalemia 09/19/2016   Hypomagnesemia 09/19/2016   Irregular heart rate 07/2018   Pancreatic mass    pancreatic masses/notes 09/17/2018   Portal vein thrombosis 09/19/2016   Protein malnutrition (HCC) 07/02/2018   chronic   Scrotal edema 07/21/2018   Seizure (HCC) last 2018?   "since ~ 2002; several; controlled w/RX" (09/17/2018)/over 2 years /pt is weaning off meds   Shingles 10/2019   Subconjunctival hemorrhage of right eye 06/14/2016   Substance abuse (HCC)    ETOH and drug   Tremor 09/26/2014    Past Surgical History:  Procedure Laterality Date   COLONOSCOPY     ESOPHAGEAL VARICE LIGATION  06/2018   ESOPHAGOGASTRODUODENOSCOPY  04/2019   ESOPHAGOGASTRODUODENOSCOPY (EGD) WITH PROPOFOL  2021   3x in 2021   HEMORRHOID SURGERY     INGUINAL HERNIA REPAIR Right X 2   PANCREAS SURGERY     "cyst"   UPPER GASTROINTESTINAL ENDOSCOPY  11/13/2021    Family History  Problem Relation Age of Onset   Stroke Mother    Congestive Heart Failure Mother    Other Father        unsure of history    Alcohol abuse Maternal Uncle    Colon cancer Neg Hx    Esophageal cancer Neg Hx    Rectal cancer Neg Hx    Stomach cancer Neg Hx    Colon polyps Neg Hx     Social History   Tobacco Use   Smoking status: Former    Packs/day: 0.33    Years: 45.00    Additional pack years: 0.00    Total pack years: 14.85    Types: Cigarettes    Quit date: 2019    Years since quitting: 5.5   Smokeless tobacco: Never   Tobacco comments:    tried smokeless 1 or 2 times but it wasn't his thing per patient  Vaping Use   Vaping Use: Former   Quit date: 07/09/2018  Substance Use Topics   Alcohol use: Not Currently    Comment:  "nothing since 06/30/2018"   Drug use: Not Currently    Types: Marijuana     Comment: "I've done everything but heroin; last drug use was marijuana in ~ 03/2018"     Current Outpatient Medications  Medication Sig Dispense Refill   ALLERGY RELIEF 10 MG tablet Take 10 mg by mouth daily.     Cholecalciferol (VITAMIN D3 PO) Take 1,000 Units by mouth daily.     Cyclobenzaprine HCl (FLEXERIL PO) Take 1 tablet by mouth every 12 (twelve) hours.     dexamethasone (DECADRON) 4 MG tablet Take 4 mg by mouth daily.     folic acid (FOLVITE) 1 MG tablet Take 1 tablet (1 mg total) by mouth daily. 90 tablet 1   furosemide (LASIX) 20 MG tablet Take 1 tablet (20 mg total) by mouth daily. 90 tablet 1   gabapentin (NEURONTIN) 300 MG  capsule Take 1 capsule (300 mg total) by mouth 2 (two) times daily. 60 capsule 11   HYDROcodone-acetaminophen (NORCO/VICODIN) 5-325 MG tablet Take 1 tablet by mouth every 6 (six) hours as needed.     lactulose (CHRONULAC) 10 GM/15ML solution Take 15 mLs (10 g total) by mouth daily. 1892 mL 2   lactulose, encephalopathy, (CHRONULAC) 10 GM/15ML SOLN Take by mouth.     LYCOPENE PO Take by mouth.     metoprolol tartrate (LOPRESSOR) 25 MG tablet Take 1 tablet (25 mg total) by mouth 2 (two) times daily. 180 tablet 1   metoprolol tartrate (LOPRESSOR) 50 MG tablet Take 50 mg by mouth 2 (two) times daily. Takes 75 mg bid     pantoprazole (PROTONIX) 40 MG tablet Take 1 tablet (40 mg total) by mouth 2 (two) times daily. 60 tablet 11   spironolactone (ALDACTONE) 25 MG tablet Take 1 tablet (25 mg total) by mouth daily. 90 tablet 1   therapeutic multivitamin-minerals (THERAGRAN-M) tablet Take 1 tablet by mouth daily. 30 tablet 6   thiamine 100 MG tablet Take 1 tablet (100 mg total) by mouth daily. 30 tablet 6   diclofenac (VOLTAREN) 50 MG EC tablet Take 50 mg by mouth 2 (two) times daily. Takes 25 mg     diclofenac (VOLTAREN) 75 MG EC tablet Take 75 mg by mouth 2 (two) times daily as needed.     ibuprofen (ADVIL) 200 MG tablet Take 200 mg by mouth every 6 (six) hours as  needed.     levETIRAcetam (KEPPRA) 500 MG tablet Take 1 tablet (500 mg total) by mouth 2 (two) times daily. 180 tablet 4   Current Facility-Administered Medications  Medication Dose Route Frequency Provider Last Rate Last Admin   0.9 %  sodium chloride infusion  500 mL Intravenous Once Lynann Bologna, MD        Allergies  Allergen Reactions   Morphine Other (See Comments)    Pt states he was out of it   Peach [Prunus Persica] Hives    Fresh peaches    Review of Systems:       Physical Exam:    BP 103/74   Pulse 67   Temp 98 F (36.7 C) (Temporal)   Resp 13   Ht 5\' 6"  (1.676 m)   Wt 137 lb (62.1 kg)   SpO2 98%   BMI 22.11 kg/m  Filed Weights   05/14/23 0918  Weight: 137 lb (62.1 kg)   Gen: awake, alert, NAD HEENT: anicteric, no pallor CV: RRR, no mrg Pulm: CTA b/l Abd: soft, NT/ND, +BS throughout Ext: no c/c/e Neuro: nonfocal  Data Reviewed: I have personally reviewed following labs and imaging studies  CBC:    Latest Ref Rng & Units 08/24/2020   11:41 AM 04/01/2019    8:29 AM 10/06/2018    3:46 PM  CBC  WBC 3.4 - 10.8 x10E3/uL 6.3  6.9  9.7   Hemoglobin 13.0 - 17.7 g/dL 16.1  09.6  04.5   Hematocrit 37.5 - 51.0 % 36.6  37.9  38.0   Platelets 150 - 450 x10E3/uL 186  203.0  265     CMP:    Latest Ref Rng & Units 08/24/2020   11:41 AM 04/01/2019    8:29 AM 10/06/2018    3:46 PM  CMP  Glucose 65 - 99 mg/dL 79  82  61   BUN 6 - 24 mg/dL 19  13  12    Creatinine 0.76 - 1.27 mg/dL  0.89  0.85  0.58   Sodium 134 - 144 mmol/L 136  137  137   Potassium 3.5 - 5.2 mmol/L 4.7  3.9  4.2   Chloride 96 - 106 mmol/L 98  102  97   CO2 20 - 29 mmol/L 27  26  22    Calcium 8.7 - 10.2 mg/dL 9.8  9.2  9.3   Total Protein 6.0 - 8.5 g/dL 7.7  7.6  7.2   Total Bilirubin 0.0 - 1.2 mg/dL 0.5  0.4  0.3   Alkaline Phos 44 - 121 IU/L 92  97  116   AST 0 - 40 IU/L 26  29  33   ALT 0 - 44 IU/L 30  31  20        Latest Ref Rng & Units 08/24/2020   11:41 AM 04/01/2019    8:29  AM 10/06/2018    3:46 PM  Hepatic Function  Total Protein 6.0 - 8.5 g/dL 7.7  7.6  7.2   Albumin 3.8 - 4.9 g/dL 4.9  4.3  3.7   AST 0 - 40 IU/L 26  29  33   ALT 0 - 44 IU/L 30  31  20    Alk Phosphatase 44 - 121 IU/L 92  97  116   Total Bilirubin 0.0 - 1.2 mg/dL 0.5  0.4  0.3       Edman Circle, MD 05/14/2023, 10:10 AM  Cc: Jim Like, NP

## 2023-05-14 NOTE — Progress Notes (Signed)
Pt's states no medical or surgical changes since previsit or office visit. 

## 2023-05-14 NOTE — Progress Notes (Signed)
Called to room to assist during endoscopic procedure.  Patient ID and intended procedure confirmed with present staff. Received instructions for my participation in the procedure from the performing physician.  

## 2023-05-14 NOTE — Op Note (Signed)
[facility] Patient Name: Todd Coffey Procedure Date: 05/14/2023 10:04 AM MRN: [id number] Endoscopist: Lynann Bologna , MD, [id number] Age: 62 Referring MD:  Date of Birth: June 20, 1961 Gender: Male Account #: [id number] Procedure:                Upper GI endoscopy Indications:              Dysphagia Medicines:                Monitored Anesthesia Care Procedure:                Pre-Anesthesia Assessment:                           - Prior to the procedure, a History and Physical                            was performed, and patient medications and                            allergies were reviewed. The patient's tolerance of                            previous anesthesia was also reviewed. The risks                            and benefits of the procedure and the sedation                            options and risks were discussed with the patient.                            All questions were answered, and informed consent                            was obtained. Prior Anticoagulants: The patient has                            taken no anticoagulant or antiplatelet agents. ASA                            Grade Assessment: III - A patient with severe                            systemic disease. After reviewing the risks and                            benefits, the patient was deemed in satisfactory                            condition to undergo the procedure.                           After obtaining informed consent, the endoscope was  passed under direct vision. Throughout the                            procedure, the patient's blood pressure, pulse, and                            oxygen saturations were monitored continuously. The                            GIF [id number] #[id number] was introduced through the                            mouth, and advanced to the second part of duodenum.                            The upper GI endoscopy was accomplished  without                            difficulty. The patient tolerated the procedure                            well. Scope In: Scope Out: Findings:                 One benign-appearing, intrinsic moderate                            (circumferential scarring or stenosis; an endoscope                            may pass) stenosis was found 35 cm from the                            incisors. This stenosis measured 1 cm (inner                            diameter). The stenosis was traversed. A TTS                            dilator was passed through the scope. Dilation with                            a 12-13.5-15 mm balloon dilator was performed to 15                            mm. The dilation site was examined and showed                            moderate mucosal disruption.                           LA Grade B (one or more mucosal breaks greater than  5 mm, not extending between the tops of two mucosal                            folds) esophagitis with no bleeding was found in                            the lower third of the esophagus. Biopsies were                            taken with a cold forceps for histology.                           A 4 cm hiatal hernia was present.                           The exam was otherwise without abnormality. Complications:            No immediate complications. Estimated Blood Loss:     Estimated blood loss: none. Impression:               - Benign-appearing esophageal stenosis. Dilated.                           - LA Grade B reflux esophagitis with no bleeding.                            Biopsied.                           - 4 cm hiatal hernia.                           - The examination was otherwise normal. Recommendation:           - Patient has a contact number available for                            emergencies. The signs and symptoms of potential                            delayed complications were discussed with  the                            patient. Return to normal activities tomorrow.                            Written discharge instructions were provided to the                            patient.                           - Post dilatation diet.                           - Continue present medications including Protonix  40 mg p.o. BID #180                           - Avoid nonsteroidals.                           - Take 1 pill at a time.                           - Await pathology results.                           - The findings and recommendations were discussed                            with the patient's family. Lynann Bologna, MD 05/14/2023 10:36:37 AM This report has been signed electronically.

## 2023-05-15 ENCOUNTER — Telehealth: Payer: Self-pay | Admitting: *Deleted

## 2023-05-15 NOTE — Telephone Encounter (Signed)
Attempted to call patient for their post-procedure follow-up call. No answer. Left voicemail.   

## 2023-05-17 ENCOUNTER — Encounter: Payer: Self-pay | Admitting: Gastroenterology

## 2023-06-12 ENCOUNTER — Ambulatory Visit: Payer: Medicaid Other | Admitting: Gastroenterology

## 2024-09-23 ENCOUNTER — Ambulatory Visit: Admitting: Gastroenterology

## 2024-09-23 NOTE — Progress Notes (Deleted)
 Chief Complaint: follow-up Primary GI Doctor: Dr. Charlanne  HPI:  Patient is a  ***  year old male/male patient with past medical history of *****who was referred to me by Tarry Donne Aurora, * on **** for a evaluation of *** .    Interval History  Patient admits/denies GERD Patient admits/denies dysphagia Patient admits/denies nausea, vomiting, or weight loss  Patient admits/denies altered bowel habits Patient admits/denies abdominal pain Patient admits/denies rectal bleeding   Denies/Admits alcohol Denies/Admits smoking Denies/Admits NSAID use. Denies/Admits they are on blood thinners.  Patients last colonoscopy Patients last EGD  Surgical history:  Patient's family history includes  Wt Readings from Last 3 Encounters:  05/14/23 137 lb (62.1 kg)  05/03/23 137 lb (62.1 kg)  11/13/21 142 lb (64.4 kg)      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   Alcoholic liver disease 07/21/2018   Altered mental status 05/06/2013   Anemia due to acute blood loss 07/2018   Anxiety attack    Ascites due to alcoholic hepatitis 07/21/2018   Chest pain 11/09/2013   have had chest pains several times (09/17/2018)   Chronic alcoholism (HCC)    thelbert 09/17/2018   Coagulopathy unknown   Family history of von Willebrand disease    sister, [name] (09/17/2018)   Gastrointestinal hemorrhage with melena unknown   GERD (gastroesophageal reflux disease)    Headache    weekly (09/17/2018)   History of blood transfusion 06/2018   10 units in 8 h; probably had some before (09/17/2018)   History of cirrhosis of liver     /notes 09/17/2018   History of hiatal hernia    History of shingles    Hypertension    Hypokalemia 09/19/2016   Hypomagnesemia 09/19/2016   Irregular heart rate 07/2018   Pancreatic mass    pancreatic masses/notes 09/17/2018   Portal vein thrombosis 09/19/2016   Protein malnutrition 07/02/2018   chronic   Scrotal edema 07/21/2018   Seizure (HCC) last 2018?   since ~ 2002; several; controlled w/RX (09/17/2018)/over 2 years /pt is weaning off meds   Shingles 10/2019   Subconjunctival hemorrhage of right eye 06/14/2016   Substance abuse (HCC)    ETOH and drug   Tremor 09/26/2014    Past Surgical History:  Procedure Laterality Date   COLONOSCOPY     ESOPHAGEAL VARICE LIGATION  06/2018   ESOPHAGOGASTRODUODENOSCOPY  04/2019   ESOPHAGOGASTRODUODENOSCOPY (EGD) WITH PROPOFOL   2021   3x in 2021   HEMORRHOID SURGERY     INGUINAL HERNIA REPAIR Right X 2   PANCREAS SURGERY     cyst   UPPER GASTROINTESTINAL ENDOSCOPY  11/13/2021    Current Outpatient Medications  Medication Sig Dispense Refill   ALLERGY RELIEF 10 MG tablet Take 10 mg by mouth daily.     Cholecalciferol (VITAMIN D3 PO) Take 1,000 Units by mouth daily.     Cyclobenzaprine HCl (FLEXERIL PO) Take 1 tablet by mouth every 12 (twelve) hours.     dexamethasone (DECADRON) 4 MG tablet Take 4 mg by mouth daily.     diclofenac (VOLTAREN) 50 MG EC tablet Take 50 mg by mouth 2 (two) times daily. Takes 25  mg     diclofenac (VOLTAREN) 75 MG EC tablet Take 75 mg by mouth 2 (two) times daily as needed.     folic acid  (FOLVITE ) 1 MG tablet Take 1 tablet (1 mg total) by mouth daily. 90 tablet 1   furosemide  (LASIX ) 20 MG tablet Take 1 tablet (20 mg total) by mouth daily. 90 tablet 1   gabapentin  (NEURONTIN ) 300 MG capsule Take 1 capsule (300 mg total) by mouth 2 (two) times daily. 60 capsule 11   HYDROcodone-acetaminophen  (NORCO/VICODIN) 5-325 MG tablet Take 1 tablet by mouth every 6 (six) hours as needed.     ibuprofen (ADVIL) 200  MG tablet Take 200 mg by mouth every 6 (six) hours as needed.     lactulose  (CHRONULAC ) 10 GM/15ML solution Take 15 mLs (10 g total) by mouth daily. 1892 mL 2   lactulose , encephalopathy, (CHRONULAC ) 10 GM/15ML SOLN Take by mouth.     levETIRAcetam  (KEPPRA ) 500 MG tablet Take 1 tablet (500 mg total) by mouth 2 (two) times daily. 180 tablet 4   LYCOPENE PO Take by mouth.     metoprolol  tartrate (LOPRESSOR ) 25 MG tablet Take 1 tablet (25 mg total) by mouth 2 (two) times daily. 180 tablet 1   metoprolol  tartrate (LOPRESSOR ) 50 MG tablet Take 50 mg by mouth 2 (two) times daily. Takes 75 mg bid     pantoprazole  (PROTONIX ) 40 MG tablet Take 1 tablet (40 mg total) by mouth 2 (two) times daily. 180 tablet 4   spironolactone  (ALDACTONE ) 25 MG tablet Take 1 tablet (25 mg total) by mouth daily. 90 tablet 1   therapeutic multivitamin-minerals (THERAGRAN-M) tablet Take 1 tablet by mouth daily. 30 tablet 6   thiamine  100 MG tablet Take 1 tablet (100 mg total) by mouth daily. 30 tablet 6   No current facility-administered medications for this visit.    Allergies as of 09/23/2024 - Review Complete 05/14/2023  Allergen Reaction Noted   Morphine  Other (See Comments) 05/14/2023   Peach [prunus persica] Hives 09/17/2018    Family History  Problem Relation Age of Onset   Stroke Mother    Congestive Heart Failure Mother    Other Father        unsure of history    Alcohol abuse Maternal Uncle    Colon cancer Neg Hx    Esophageal cancer Neg Hx    Rectal cancer Neg Hx    Stomach cancer Neg Hx    Colon polyps Neg Hx     Review of Systems:    Constitutional: No weight loss, fever, chills, weakness or fatigue HEENT: Eyes: No change in vision               Ears, Nose, Throat:  No change in hearing or congestion Skin: No rash or itching Cardiovascular: No chest pain, chest pressure or palpitations   Respiratory: No SOB or cough Gastrointestinal: See HPI and otherwise negative Genitourinary: No dysuria  or change in urinary frequency Neurological: No headache, dizziness or syncope Musculoskeletal: No new muscle or joint pain Hematologic: No bleeding or bruising Psychiatric: No history of depression or anxiety    Physical Exam:  Vital signs: There were no vitals taken for this visit.  Constitutional:   Pleasant *** male/male appears to be in NAD, Well developed, Well nourished, alert and cooperative Eyes:   PEERL, EOMI. No icterus. Conjunctiva pink. Neck:  Supple Throat: Oral cavity and pharynx without inflammation, swelling or lesion.  Respiratory: Respirations even and unlabored. Lungs clear  to auscultation bilaterally.   No wheezes, crackles, or rhonchi.  Cardiovascular: Normal S1, S2. Regular rate and rhythm. No peripheral edema, cyanosis or pallor.  Gastrointestinal:  Soft, nondistended, nontender. No rebound or guarding. Normal bowel sounds. No appreciable masses or hepatomegaly. Rectal:  Not performed.  Anoscopy: Msk:  Symmetrical without gross deformities. Without edema, no deformity or joint abnormality.  Neurologic:  Alert and  oriented x4;  grossly normal neurologically.  Skin:   Dry and intact without significant lesions or rashes.  RELEVANT LABS AND IMAGING: CBC    Latest Ref Rng & Units 08/24/2020   11:41 AM 04/01/2019    8:29 AM 10/06/2018    3:46 PM  CBC  WBC 3.4 - 10.8 x10E3/uL 6.3  6.9  9.7   Hemoglobin 13.0 - 17.7 g/dL 87.3  86.9  87.0   Hematocrit 37.5 - 51.0 % 36.6  37.9  38.0   Platelets 150 - 450 x10E3/uL 186  203.0  265      CMP     Latest Ref Rng & Units 08/24/2020   11:41 AM 04/01/2019    8:29 AM 10/06/2018    3:46 PM  CMP  Glucose 65 - 99 mg/dL 79  82  61   BUN 6 - 24 mg/dL 19  13  12    Creatinine 0.76 - 1.27 mg/dL 9.10  9.14  9.41   Sodium 134 - 144 mmol/L 136  137  137   Potassium 3.5 - 5.2 mmol/L 4.7  3.9  4.2   Chloride 96 - 106 mmol/L 98  102  97   CO2 20 - 29 mmol/L 27  26  22    Calcium 8.7 - 10.2 mg/dL 9.8  9.2  9.3   Total Protein  6.0 - 8.5 g/dL 7.7  7.6  7.2   Total Bilirubin 0.0 - 1.2 mg/dL 0.5  0.4  0.3   Alkaline Phos 44 - 121 IU/L 92  97  116   AST 0 - 40 IU/L 26  29  33   ALT 0 - 44 IU/L 30  31  20       Lab Results  Component Value Date   TSH 1.920 11/07/2018   05/2023 EGD - Benign- appearing esophageal stenosis. Dilated. - LA Grade B reflux esophagitis with no bleeding. Biopsied. - 4 cm hiatal hernia. - The examination was otherwise normal.  11/13/2021 EGD Benign- appearing esophageal stenosis. Dilated. - 4 cm hiatal hernia. - The examination was otherwise normal. - No specimens collected.    Assessment: GERD with H/O HH, severe recurrent eso stricture s/p frequent dilatations 11/2016, 07/10/2019 09/2019 (with steroid injection), 12/2019, 04/2020,11/2021, 05/2023.  ETOH liver cirrhosis with H/O ascites. Mild HE with sl elevated ammonia. US  ABD (10/21):-stable cirrhosis and chronic pancreatitis. No masses.  Normal flow on Doppler. Nl AFP 03/2019. Alb 4.3 (03/2019)   H/O UGI bleeding d/t erosive esophagitis s/p clipping and endoscopy x3 with severe coagulopathy/alcohol abuse (INR>9)and hypovolemic/septic shock s/p 10U PRBC. No Varices. (Aug 2019 @ [place] )   Chronic ETOH pancreatitis (quit ETOH Jun 30, 2018, when he moved to [facility]). MRI/MRCP 09/2018 -acute on chronic pancreatitis. Possibly pancreas divisum. Diffusely mildly dilated pancreatic duct. No PD stones/ masses. 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 No exocrine or endocrine pancreatic insufficiency clinically. H/O pancreatic pseudocyst s/p endoscopic drainage in past. US  ABD (10/21):-stable cirrhosis and chronic pancreatitis. No masses. Nl CA19-9, CEA 03/2019   H/O seizures (? ETOH withdrawal)    H/O multi-substance abuse (cocaine,  marijuana, tobacco).  Quit since he moved to [facility].  Under strict care of his sister [name].   Comorbid conditions- SZ, anxiety/depression, family history of von Willebrand  disease  Plan: -Continue protonix  40mg  po bid. - Avoid nonsteroidals. -Repeat dilatation as needed basis. -US  abdo for HCC screening -Salt and fluid restricted diet.  -Continue low dose lasix  and spironolactone . -Continue rest of the medications -I have instructed patient that he needs to chew foods especially meats and breads well and eat slowly.   Thank you for the courtesy of this consult. Please call me with any questions or concerns.   Shelly Spenser, FNP-C [facility] 09/23/2024, 6:49 AM  Cc: Achreja, Donne Aurora, *

## 2024-11-06 ENCOUNTER — Encounter: Payer: Self-pay | Admitting: Gastroenterology

## 2024-11-06 ENCOUNTER — Other Ambulatory Visit

## 2024-11-06 ENCOUNTER — Ambulatory Visit: Admitting: Gastroenterology

## 2024-11-06 VITALS — BP 128/72 | HR 67 | Ht 66.0 in | Wt 135.0 lb

## 2024-11-06 DIAGNOSIS — K860 Alcohol-induced chronic pancreatitis: Secondary | ICD-10-CM | POA: Diagnosis not present

## 2024-11-06 DIAGNOSIS — K7682 Hepatic encephalopathy: Secondary | ICD-10-CM | POA: Diagnosis not present

## 2024-11-06 DIAGNOSIS — K7030 Alcoholic cirrhosis of liver without ascites: Secondary | ICD-10-CM

## 2024-11-06 DIAGNOSIS — K222 Esophageal obstruction: Secondary | ICD-10-CM

## 2024-11-06 DIAGNOSIS — K8689 Other specified diseases of pancreas: Secondary | ICD-10-CM | POA: Diagnosis not present

## 2024-11-06 DIAGNOSIS — F1020 Alcohol dependence, uncomplicated: Secondary | ICD-10-CM

## 2024-11-06 DIAGNOSIS — K219 Gastro-esophageal reflux disease without esophagitis: Secondary | ICD-10-CM

## 2024-11-06 DIAGNOSIS — R1310 Dysphagia, unspecified: Secondary | ICD-10-CM | POA: Diagnosis not present

## 2024-11-06 LAB — CBC WITH DIFFERENTIAL/PLATELET
Basophils Absolute: 0 K/uL (ref 0.0–0.1)
Basophils Relative: 0.6 % (ref 0.0–3.0)
Eosinophils Absolute: 0.1 K/uL (ref 0.0–0.7)
Eosinophils Relative: 1.5 % (ref 0.0–5.0)
HCT: 39.7 % (ref 39.0–52.0)
Hemoglobin: 13.9 g/dL (ref 13.0–17.0)
Lymphocytes Relative: 18.9 % (ref 12.0–46.0)
Lymphs Abs: 1.2 K/uL (ref 0.7–4.0)
MCHC: 34.9 g/dL (ref 30.0–36.0)
MCV: 92.5 fl (ref 78.0–100.0)
Monocytes Absolute: 0.7 K/uL (ref 0.1–1.0)
Monocytes Relative: 10.4 % (ref 3.0–12.0)
Neutro Abs: 4.4 K/uL (ref 1.4–7.7)
Neutrophils Relative %: 68.6 % (ref 43.0–77.0)
Platelets: 225 K/uL (ref 150.0–400.0)
RBC: 4.29 Mil/uL (ref 4.22–5.81)
RDW: 13.2 % (ref 11.5–15.5)
WBC: 6.4 K/uL (ref 4.0–10.5)

## 2024-11-06 LAB — BASIC METABOLIC PANEL WITH GFR
BUN: 16 mg/dL (ref 6–23)
CO2: 29 meq/L (ref 19–32)
Calcium: 8.6 mg/dL (ref 8.4–10.5)
Chloride: 100 meq/L (ref 96–112)
Creatinine, Ser: 0.61 mg/dL (ref 0.40–1.50)
GFR: 102.42 mL/min
Glucose, Bld: 93 mg/dL (ref 70–99)
Potassium: 4.2 meq/L (ref 3.5–5.1)
Sodium: 137 meq/L (ref 135–145)

## 2024-11-06 LAB — HEPATIC FUNCTION PANEL
ALT: 16 U/L (ref 3–53)
AST: 18 U/L (ref 5–37)
Albumin: 4.3 g/dL (ref 3.5–5.2)
Alkaline Phosphatase: 69 U/L (ref 39–117)
Bilirubin, Direct: 0.2 mg/dL (ref 0.1–0.3)
Total Bilirubin: 0.8 mg/dL (ref 0.2–1.2)
Total Protein: 6.8 g/dL (ref 6.0–8.3)

## 2024-11-06 LAB — PROTIME-INR
INR: 1 ratio (ref 0.8–1.0)
Prothrombin Time: 10.8 s (ref 9.6–13.1)

## 2024-11-06 MED ORDER — PANTOPRAZOLE SODIUM 40 MG PO TBEC: 40.0000 mg | DELAYED_RELEASE_TABLET | Freq: Two times a day (BID) | ORAL | 4 refills | Status: AC

## 2024-11-06 NOTE — Progress Notes (Signed)
 "  Chief Complaint:discuss EGD, dysphagia Primary GI Doctor: Dr. Charlanne  HPI:  Patient is a  64  year old male patient with past medical history of GERD with esophageal strictures, ETOH liver cirrhosis with H/O ascites, and Chronic ETOH pancreatitis, who presents for follow-up for evaluation of dysphagia.   Interval History Patient last seen in GI clinic on 11/13/21 by Dr. Charlanne for follow-up.  Patient presents with eso dysphagia.  Patient has history of GERD and was taking pantoprazole  40 mg twice daily.  He ran out of pantoprazole  several months ago and has been taking OTC Prilosec with Tums prn.  Reports 1-2 episodes of nausea with vomiting over last few months.   Patient taking lactulose  prn and has at least 2 BM's per day.  No blood in stool. No abdominal swelling. No leg swelling.   Hx of Right inguinal hernia, repaired x2. Reports he has had issues again would like referral. [place].   He drinks occasional glass of wine.   Of note - has quit all drugs, quit smoking since Christia Domke 2019.   History of seizures, last episode 1-2 years ago.    Not on blood thinners.  His sister is his POA.   Review of past medical records: -H/O UGI bleeding d/t erosive esophagitis s/p clipping and endoscopy x3 with severe coagulopathy/alcohol abuse (INR>9)and hypovolemic/septic shock s/p 10U PRBC. No Varices. (Aug 2019    colon - neg 08/2017 at [place], [place] - neg except for mod sigmoid diverticulosis (report in care everywhere).  No need for 10 years.   Last EGD 7/ 2024 - benign appearing esophageal stenosis. Dilated to 15mm. -LA grade B esophagitis -4 cm HH Path: biopsies showed reflux  EGD 09/2020 - Benign-appearing esophageal stenosis. Dilated to 13 mm. Injected with steroids. - 4 cm hiatal hernia. - No specimens collected.  Wt Readings from Last 3 Encounters:  11/06/24 135 lb (61.2 kg)  05/14/23 137 lb (62.1 kg)  05/03/23 137 lb (62.1 kg)    Past Medical  History:  Diagnosis Date   Acute on chronic pancreatitis (HCC)    hx of pancreatitis; nothing recently til today /notes 09/17/2018 (09/17/2018)   Acute stomach ulcer 07/2018   Alcohol dependence with withdrawal (HCC) 07/02/2018   w/complications   Alcohol withdrawal (HCC) 09/19/2016   Alcohol-induced chronic pancreatitis (HCC) 09/26/2014; 09/19/2016   Alcoholic gastritis 09/19/2016   Alcoholic liver disease 07/21/2018   Altered mental status 05/06/2013   Anemia due to acute blood loss 07/2018   Anxiety attack    Ascites due to alcoholic hepatitis (HCC) 07/21/2018   Chest pain 11/09/2013   have had chest pains several times (09/17/2018)   Chronic alcoholism (HCC)    thelbert 09/17/2018   Coagulopathy unknown   Family history of von Willebrand disease    sister, [name] (09/17/2018)   Gastrointestinal hemorrhage with melena unknown   GERD (gastroesophageal reflux disease)    Headache    weekly (09/17/2018)   History of blood transfusion 06/2018   10 units in 8 h; probably had some before (09/17/2018)   History of cirrhosis of liver    /notes 09/17/2018   History of hiatal hernia    History of shingles    Hypertension    Hypokalemia 09/19/2016   Hypomagnesemia 09/19/2016   Irregular heart rate 07/2018   Pancreatic mass    pancreatic masses/notes 09/17/2018   Portal vein thrombosis 09/19/2016   Protein malnutrition 07/02/2018   chronic   Scrotal edema 07/21/2018  Seizure (HCC) last 2018?   since ~ 2002; several; controlled w/RX (09/17/2018)/over 2 years /pt is weaning off meds   Shingles 10/2019   Subconjunctival hemorrhage of right eye 06/14/2016   Substance abuse (HCC)    ETOH and drug   Tremor 09/26/2014    Past Surgical History:  Procedure Laterality Date   COLONOSCOPY     ESOPHAGEAL VARICE LIGATION  06/2018   ESOPHAGOGASTRODUODENOSCOPY  04/2019   ESOPHAGOGASTRODUODENOSCOPY (EGD) WITH PROPOFOL   2021   3x in 2021   HEMORRHOID SURGERY     INGUINAL  HERNIA REPAIR Right X 2   PANCREAS SURGERY     cyst   UPPER GASTROINTESTINAL ENDOSCOPY  11/13/2021    Current Outpatient Medications  Medication Sig Dispense Refill   ALLERGY RELIEF 10 MG tablet Take 10 mg by mouth daily.     Cholecalciferol (VITAMIN D3 PO) Take 1,000 Units by mouth daily.     Cyclobenzaprine HCl (FLEXERIL PO) Take 1 tablet by mouth every 12 (twelve) hours.     dexamethasone (DECADRON) 4 MG tablet Take 4 mg by mouth daily.     diclofenac (VOLTAREN) 50 MG EC tablet Take 50 mg by mouth 2 (two) times daily. Takes 25 mg     diclofenac (VOLTAREN) 75 MG EC tablet Take 75 mg by mouth 2 (two) times daily as needed.     folic acid  (FOLVITE ) 1 MG tablet Take 1 tablet (1 mg total) by mouth daily. 90 tablet 1   furosemide  (LASIX ) 20 MG tablet Take 1 tablet (20 mg total) by mouth daily. 90 tablet 1   gabapentin  (NEURONTIN ) 300 MG capsule Take 1 capsule (300 mg total) by mouth 2 (two) times daily. 60 capsule 11   HYDROcodone-acetaminophen  (NORCO/VICODIN) 5-325 MG tablet Take 1 tablet by mouth every 6 (six) hours as needed.     ibuprofen (ADVIL) 200 MG tablet Take 200 mg by mouth every 6 (six) hours as needed.     lactulose  (CHRONULAC ) 10 GM/15ML solution Take 15 mLs (10 g total) by mouth daily. 1892 mL 2   lactulose , encephalopathy, (CHRONULAC ) 10 GM/15ML SOLN Take by mouth.     levETIRAcetam  (KEPPRA ) 500 MG tablet Take 1 tablet (500 mg total) by mouth 2 (two) times daily. 180 tablet 4   LYCOPENE PO Take by mouth.     metoprolol  tartrate (LOPRESSOR ) 25 MG tablet Take 1 tablet (25 mg total) by mouth 2 (two) times daily. 180 tablet 1   metoprolol  tartrate (LOPRESSOR ) 50 MG tablet Take 50 mg by mouth 2 (two) times daily. Takes 75 mg bid     spironolactone  (ALDACTONE ) 25 MG tablet Take 1 tablet (25 mg total) by mouth daily. 90 tablet 1   therapeutic multivitamin-minerals (THERAGRAN-M) tablet Take 1 tablet by mouth daily. 30 tablet 6   thiamine  100 MG tablet Take 1 tablet (100 mg total) by  mouth daily. 30 tablet 6   pantoprazole  (PROTONIX ) 40 MG tablet Take 1 tablet (40 mg total) by mouth 2 (two) times daily. 180 tablet 4   No current facility-administered medications for this visit.    Allergies as of 11/06/2024 - Review Complete 11/06/2024  Allergen Reaction Noted   Morphine  Other (See Comments) 05/14/2023   Peach [prunus persica] Hives 09/17/2018    Family History  Problem Relation Age of Onset   Stroke Mother    Congestive Heart Failure Mother    Other Father        unsure of history    Alcohol abuse Maternal Uncle  Colon cancer Neg Hx    Esophageal cancer Neg Hx    Rectal cancer Neg Hx    Stomach cancer Neg Hx    Colon polyps Neg Hx     Review of Systems:    Constitutional: No weight loss, fever, chills, weakness or fatigue HEENT: Eyes: No change in vision               Ears, Nose, Throat:  No change in hearing or congestion Skin: No rash or itching Cardiovascular: No chest pain, chest pressure or palpitations   Respiratory: No SOB or cough Gastrointestinal: See HPI and otherwise negative Genitourinary: No dysuria or change in urinary frequency Neurological: No headache, dizziness or syncope Musculoskeletal: No new muscle or joint pain Hematologic: No bleeding or bruising Psychiatric: No history of depression or anxiety    Physical Exam:  Vital signs: BP 128/72   Pulse 67   Ht 5' 6 (1.676 m)   Wt 135 lb (61.2 kg)   SpO2 97%   BMI 21.79 kg/m   Constitutional: Pleasant male appears to be in NAD, Well developed, Well nourished, alert and cooperative Eyes:   PEERL, EOMI. No icterus. Conjunctiva pink. Throat: Oral cavity and pharynx without inflammation, swelling or lesion.  Respiratory: Respirations even and unlabored. Lungs clear to auscultation bilaterally.   No wheezes, crackles, or rhonchi.  Cardiovascular: Normal S1, S2. Regular rate and rhythm. No peripheral edema, cyanosis or pallor.  Gastrointestinal:  Soft, nondistended, nontender.  No rebound or guarding. Normal bowel sounds. Right inguinal hernia. Rectal:  Not performed.  Msk:  Symmetrical without gross deformities. Without edema, no deformity or joint abnormality.  Neurologic:  Alert and  oriented x4;  grossly normal neurologically. No asterixis.  Skin:   Dry and intact without significant lesions or rashes.  RELEVANT LABS AND IMAGING: CBC    Latest Ref Rng & Units 08/24/2020   11:41 AM 04/01/2019    8:29 AM 10/06/2018    3:46 PM  CBC  WBC 3.4 - 10.8 x10E3/uL 6.3  6.9  9.7   Hemoglobin 13.0 - 17.7 g/dL 87.3  86.9  87.0   Hematocrit 37.5 - 51.0 % 36.6  37.9  38.0   Platelets 150 - 450 x10E3/uL 186  203.0  265      CMP     Latest Ref Rng & Units 08/24/2020   11:41 AM 04/01/2019    8:29 AM 10/06/2018    3:46 PM  CMP  Glucose 65 - 99 mg/dL 79  82  61   BUN 6 - 24 mg/dL 19  13  12    Creatinine 0.76 - 1.27 mg/dL 9.10  9.14  9.41   Sodium 134 - 144 mmol/L 136  137  137   Potassium 3.5 - 5.2 mmol/L 4.7  3.9  4.2   Chloride 96 - 106 mmol/L 98  102  97   CO2 20 - 29 mmol/L 27  26  22    Calcium 8.7 - 10.2 mg/dL 9.8  9.2  9.3   Total Protein 6.0 - 8.5 g/dL 7.7  7.6  7.2   Total Bilirubin 0.0 - 1.2 mg/dL 0.5  0.4  0.3   Alkaline Phos 44 - 121 IU/L 92  97  116   AST 0 - 40 IU/L 26  29  33   ALT 0 - 44 IU/L 30  31  20       Lab Results  Component Value Date   TSH 1.920 11/07/2018     Assessment:  Encounter Diagnoses  Name Primary?   Gastroesophageal reflux disease, unspecified whether esophagitis present Yes   Dysphagia, unspecified type    Esophageal stricture    Alcoholic cirrhosis of liver without ascites (HCC)    Chronic pancreatitis due to chronic alcoholism (HCC)     #1. GERD with H/O HH and esophagitis, severe recurrent eso stricture s/p frequent dilatations 11/2016, 07/10/2019 09/2019 (with steroid injection), 12/2019, 04/2020, 09/2020, 11/2021, 05/2023. Ran out of PPI therapy. Refilled today.   #2. ETOH liver cirrhosis with H/O ascites. Mild HE with sl  elevated ammonia. US  08/2020 mild cirrhosis, no ascites or splenomegaly. Normal flow on Doppler. Nl AFP 08/2020. Alb 4.9 (08/2020)  #3. H/O UGI bleeding d/t erosive esophagitis s/p clipping and endoscopy x3 with severe coagulopathy/alcohol abuse (INR>9)and hypovolemic/septic shock s/p 10U PRBC. No Varices. (Aug 2019 @ [place] )   #4. Chronic ETOH pancreatitis (quit ETOH Jun 30, 2018, when he moved to [facility]). MRI/MRCP 09/2018 -acute on chronic pancreatitis.  Possibly pancreas divisum.  Diffusely mildly dilated pancreatic duct. No PD stones/ masses. 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  No exocrine or endocrine pancreatic insufficiency clinically. H/O pancreatic pseudocyst s/p endoscopic drainage in past. Nl CA19-9, CEA 03/2019   #5.  H/O seizures (? ETOH withdrawal)   #6.  H/O multi-substance abuse (cocaine, marijuana, tobacco).  Quit since he moved to [facility].  Under strict care of his sister [name]/ POA   #7.  Comorbid conditions- SZ, anxiety/depression, family history of von Willebrand disease   Plan: -Continue Protonix  40mg  po bid, refilled  -Recommend GERD diet, no late meals -Rpt EGD with dilatation in [facility] with Dr. Charlanne. The risks and benefits of EGD with possible biopsies and esophageal dilation were discussed with the patient who agrees to proceed. -US  abdo for HCC screening  -CBC, hepatic function panel, BMET, PT/INR, AFP -Salt and fluid restricted diet.  -Continue lasix  and spironolactone . -Continue lactulose   -I have instructed patient that he needs to chew foods especially meats and breads well and eat slowly. -referral to colon rectal surgeon for right inguinal hernia  -recall colonoscopy 08/2027  Thank you for the courtesy of this consult. Please call me with any questions or concerns.   Earlisha Sharples, FNP-C [facility] 11/06/2024, 11:17 AM  Cc: Tarry Donne Aurora, MD  "

## 2024-11-06 NOTE — Patient Instructions (Addendum)
 Dysphagia precautions  GERD Restart pantoprazole  40 mg twice daily 1 capsule 30-45 mins before breakfast and dinner Recommend GERD diet  Cirrhosis [facility] abdominal [facility] Lab work today Recommend Salt and fluid restricted diet.  Continue lasix  and spironolactone  as ordered Continue lactulose  to have 2-3 Bm's per day  Right inguinal hernia Referral to [facility]   Your provider has requested that you go to the basement level for lab work before leaving today. Press B on the elevator. The lab is located at the first door on the left as you exit the elevator.  You have been scheduled for an abdominal ultrasound at [facility] on 12/14/24 at 10:30 am. Please arrive 30 minutes prior to your appointment for registration. Make certain not to have anything to eat or drink after midnight prior to your appointment. Should you need to reschedule your appointment, please contact radiology at [phone]. This test typically takes about 30 minutes to perform.  You have been scheduled for an endoscopy. Please follow written instructions given to you at your visit today.  If you use inhalers (even only as needed), please bring them with you on the day of your procedure.  If you take any of the following medications, they will need to be adjusted prior to your procedure:   DO NOT TAKE 7 DAYS PRIOR TO TEST- Trulicity (dulaglutide) Ozempic, Wegovy (semaglutide) Mounjaro, Zepbound (tirzepatide) Bydureon Bcise (exanatide extended release)  DO NOT TAKE 1 DAY PRIOR TO YOUR TEST Rybelsus (semaglutide) Adlyxin (lixisenatide) Victoza (liraglutide) Byetta (exanatide) ___________________________________________________________________________  Due to recent changes in healthcare laws, you may see the results of your imaging and laboratory studies on MyChart before your provider has had a chance to review them.  We understand that in some cases there may be results  that are confusing or concerning to you. Not all laboratory results come back in the same time frame and the provider may be waiting for multiple results in order to interpret others.  Please give us  48 hours in order for your provider to thoroughly review all the results before contacting the office for clarification of your results.  _______________________________________________________  If your blood pressure at your visit was 140/90 or greater, please contact your primary care physician to follow up on this.  _______________________________________________________  If you are age 64 or older, your body mass index should be between 23-30. Your Body mass index is 21.79 kg/m. If this is out of the aforementioned range listed, please consider follow up with your Primary Care Provider.  If you are age 1 or younger, your body mass index should be between 19-25. Your Body mass index is 21.79 kg/m. If this is out of the aformentioned range listed, please consider follow up with your Primary Care Provider.   ________________________________________________________  The [facility] providers would like to encourage you to use MYCHART to communicate with providers for non-urgent requests or questions.  Due to long hold times on the telephone, sending your provider a message by [facility] may be a faster and more efficient way to get a response.  Please allow 48 business hours for a response.  Please remember that this is for non-urgent requests.  _______________________________________________________  [facility] is using a team-based approach to care.  Your team is made up of your doctor and two to three APPS. Our APPS (Nurse Practitioners and Physician Assistants) work with your physician to ensure care continuity for you. They are fully qualified to address your health concerns and  develop a treatment plan. They communicate directly with your gastroenterologist to care for you. Seeing the  Advanced Practice Practitioners on your physician's team can help you by facilitating care more promptly, often allowing for earlier appointments, access to diagnostic testing, procedures, and other specialty referrals.  Thank you for trusting me with your gastrointestinal care. Deanna May, FNP-C

## 2024-11-07 LAB — AFP TUMOR MARKER: AFP-Tumor Marker: 2.1 ng/mL

## 2024-11-09 ENCOUNTER — Ambulatory Visit: Payer: Self-pay | Admitting: Gastroenterology

## 2024-11-13 ENCOUNTER — Ambulatory Visit (HOSPITAL_BASED_OUTPATIENT_CLINIC_OR_DEPARTMENT_OTHER)
Admission: RE | Admit: 2024-11-13 | Discharge: 2024-11-13 | Disposition: A | Source: Ambulatory Visit | Attending: Gastroenterology | Admitting: Radiology

## 2024-11-13 DIAGNOSIS — R1310 Dysphagia, unspecified: Secondary | ICD-10-CM | POA: Diagnosis not present

## 2024-11-13 DIAGNOSIS — K222 Esophageal obstruction: Secondary | ICD-10-CM | POA: Diagnosis not present

## 2024-11-13 DIAGNOSIS — K219 Gastro-esophageal reflux disease without esophagitis: Secondary | ICD-10-CM | POA: Diagnosis not present

## 2024-11-20 ENCOUNTER — Other Ambulatory Visit: Payer: Self-pay | Admitting: Surgery

## 2024-11-23 ENCOUNTER — Other Ambulatory Visit (HOSPITAL_BASED_OUTPATIENT_CLINIC_OR_DEPARTMENT_OTHER): Payer: Self-pay | Admitting: Surgery

## 2024-11-23 DIAGNOSIS — K7030 Alcoholic cirrhosis of liver without ascites: Secondary | ICD-10-CM

## 2024-11-23 DIAGNOSIS — K4091 Unilateral inguinal hernia, without obstruction or gangrene, recurrent: Secondary | ICD-10-CM

## 2024-12-01 NOTE — Telephone Encounter (Signed)
-----   Message from [facility], NP sent at 12/01/2024 10:19 AM EST ----- Karna Please let pt know results of abd u/s  Cathryne, NP

## 2024-12-01 NOTE — Telephone Encounter (Signed)
 Inbound call from patient sister in regards to call about his results. Patient sister is requesting a call back at [phone]. Please advise.

## 2024-12-02 ENCOUNTER — Inpatient Hospital Stay (HOSPITAL_BASED_OUTPATIENT_CLINIC_OR_DEPARTMENT_OTHER): Admission: RE | Admit: 2024-12-02 | Discharge: 2024-12-02 | Attending: Surgery | Admitting: Surgery

## 2024-12-02 DIAGNOSIS — K4091 Unilateral inguinal hernia, without obstruction or gangrene, recurrent: Secondary | ICD-10-CM

## 2024-12-02 DIAGNOSIS — K7030 Alcoholic cirrhosis of liver without ascites: Secondary | ICD-10-CM

## 2024-12-02 MED ORDER — IOHEXOL 300 MG/ML  SOLN
100.0000 mL | Freq: Once | INTRAMUSCULAR | Status: AC | PRN
  Administered 2024-12-02: 100 mL via INTRAVENOUS

## 2024-12-04 ENCOUNTER — Other Ambulatory Visit: Payer: Self-pay | Admitting: Surgery

## 2024-12-11 ENCOUNTER — Ambulatory Visit: Admitting: Gastroenterology

## 2024-12-11 ENCOUNTER — Encounter: Payer: Self-pay | Admitting: Gastroenterology

## 2024-12-11 VITALS — BP 150/96 | HR 62 | Temp 97.9°F | Resp 11 | Ht 66.0 in | Wt 135.0 lb

## 2024-12-11 DIAGNOSIS — K219 Gastro-esophageal reflux disease without esophagitis: Secondary | ICD-10-CM

## 2024-12-11 MED ORDER — SODIUM CHLORIDE 0.9 % IV SOLN: 500.0000 mL | INTRAVENOUS | Status: DC

## 2024-12-11 NOTE — Progress Notes (Signed)
 "   Chief Complaint: FU  Referring Provider:  Tarry Donne Aurora, MD      ASSESSMENT AND PLAN;   #1. GERD with H/O HH, severe recurrent eso stricture s/p frequent dilatations 11/2016, 07/10/2019 09/2019 (with steroid injection), 12/2019, 04/2020.  #2. ETOH liver cirrhosis with H/O ascites.  Mild HE with sl elevated ammonia. US  03/2028- mild cirrhosis, no ascites or splenomegaly.  Normal flow on Doppler. Nl AFP 03/2019. Alb 4.3 (03/2019)  #3. H/O UGI bleeding d/t erosive esophagitis s/p clipping and endoscopy x3 with severe coagulopathy/alcohol abuse (INR>9)and hypovolemic/septic shock s/p 10U PRBC. No Varices. (Aug 2019 @ [place] )  #4. Chronic ETOH pancreatitis (quit ETOH Jun 30, 2018, when he moved to [facility]). MRI/MRCP 09/2018 -acute on chronic pancreatitis.  Possibly pancreas divisum.  Diffusely mildly dilated pancreatic duct. No PD stones/ masses. 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  No exocrine or endocrine pancreatic insufficiency clinically. H/O pancreatic pseudocyst s/p endoscopic drainage in past. Nl CA19-9, CEA 03/2019  #5.  H/O seizures (? ETOH withdrawal)  #6.  H/O multi-substance abuse (cocaine, marijuana, tobacco).  Quit since he moved to [facility].  Under strict care of his sister [name].  #7.  Comorbid conditions- SZ, anxiety/depression, family history of von Willebrand disease   Plan: -Continue protonix  40mg  po bid. -Rpt EGD at  [facility] Jan 2023. -US  abdo for [facility] screening- he wants to get it done next yr. -Salt and fluid restricted diet.  -Continue low dose lasix  and spironolactone . -Continue rest of the medications -I have instructed patient that he needs to chew foods especially meats and breads well and eat slowly.   HPI:    Todd Coffey is a 64 y.o. male   For follow-up Doing very well Still having problems swallowing at times but not as bad. Would like to get repeat EGD with dilatation.  The EGD with dilatation with  steroids helped the most.  Has been compliant with his medications.  No nonsteroidals.  He has also been compliant with salt restriction.  No further alcohol.  Of note - has quit drinking all alcohol, quit all drugs, quit smoking since May 2019.  He was very sick in May with upper GI bleeding/alcohol withdrawal.  [name] (sister) made a move to [place] .   On lactulose  1/day with 1-2 softer BM/day. Rifaxamin not started as it was not approved.   Review of past medical records: -H/O UGI bleeding d/t erosive esophagitis s/p clipping and endoscopy x3 with severe coagulopathy/alcohol abuse (INR>9)and hypovolemic/septic shock s/p 10U PRBC. No Varices. (Aug 2019   colon - neg 08/2017 at [place], [place] - neg except for mod sigmoid diverticulosis (report in care everywhere).  No need for 10 years.  Last EGD 09/2020 - Benign-appearing esophageal stenosis. Dilated to 13 mm. Injected with steroids. - 4 cm hiatal hernia. - No specimens collected.  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   Alcoholic liver disease 07/21/2018   Altered mental status 05/06/2013   Anemia due to acute blood loss 07/2018   Anxiety attack    Ascites due to alcoholic hepatitis (HCC) 07/21/2018   Chest pain 11/09/2013   have had chest pains several times (  09/17/2018)   Chronic alcoholism (HCC)    thelbert 09/17/2018   Coagulopathy unknown   Family history of von Willebrand disease    sister, [name] (09/17/2018)   Gastrointestinal hemorrhage with melena unknown   GERD (gastroesophageal reflux disease)    Headache    weekly (09/17/2018)   History of blood  transfusion 06/2018   10 units in 8 h; probably had some before (09/17/2018)   History of cirrhosis of liver    /notes 09/17/2018   History of hiatal hernia    History of shingles    Hypertension    Hypokalemia 09/19/2016   Hypomagnesemia 09/19/2016   Irregular heart rate 07/2018   Pancreatic mass    pancreatic masses/notes 09/17/2018   Portal vein thrombosis 09/19/2016   Protein malnutrition 07/02/2018   chronic   Scrotal edema 07/21/2018   Seizure (HCC) last 2018?   since ~ 2002; several; controlled w/RX (09/17/2018)/over 2 years /pt is weaning off meds   Shingles 10/2019   Subconjunctival hemorrhage of right eye 06/14/2016   Substance abuse (HCC)    ETOH and drug   Tremor 09/26/2014    Past Surgical History:  Procedure Laterality Date   COLONOSCOPY     ESOPHAGEAL VARICE LIGATION  06/2018   ESOPHAGOGASTRODUODENOSCOPY  04/2019   ESOPHAGOGASTRODUODENOSCOPY (EGD) WITH PROPOFOL   2021   3x in 2021   HEMORRHOID SURGERY     INGUINAL HERNIA REPAIR Right X 2   PANCREAS SURGERY     cyst   UPPER GASTROINTESTINAL ENDOSCOPY  11/13/2021    Family History  Problem Relation Age of Onset   Stroke Mother    Congestive Heart Failure Mother    Other Father        unsure of history    Alcohol abuse Maternal Uncle    Colon cancer Neg Hx    Esophageal cancer Neg Hx    Rectal cancer Neg Hx    Stomach cancer Neg Hx    Colon polyps Neg Hx     Social History   Tobacco Use   Smoking status: Former    Current packs/day: 0.00    Average packs/day: 0.3 packs/day for 45.0 years (14.9 ttl pk-yrs)    Types: Cigarettes    Start date: 74    Quit date: 2019    Years since quitting: 7.1   Smokeless tobacco: Never   Tobacco comments:    tried smokeless 1 or 2 times but it wasn't his thing per patient  Vaping Use   Vaping status: Former   Quit date: 07/09/2018  Substance Use Topics   Alcohol use: Not Currently    Comment:  nothing since 06/30/2018   Drug use: Not Currently     Types: Marijuana    Comment: I've done everything but heroin; last drug use was marijuana in ~ 03/2018     Current Outpatient Medications  Medication Sig Dispense Refill   Cholecalciferol (VITAMIN D3 PO) Take 1,000 Units by mouth daily.     folic acid  (FOLVITE ) 1 MG tablet Take 1 tablet (1 mg total) by mouth daily. 90 tablet 1   furosemide  (LASIX ) 20 MG tablet Take 1 tablet (20 mg total) by mouth daily. 90 tablet 1   gabapentin  (NEURONTIN ) 300 MG capsule Take 1 capsule (300 mg total) by mouth 2 (two) times daily. 60 capsule 11   ibuprofen (ADVIL) 200 MG tablet Take 200 mg by mouth every 6 (six) hours as needed.     lactulose  (CHRONULAC ) 10 GM/15ML solution Take 15  mLs (10 g total) by mouth daily. 1892 mL 2   lactulose , encephalopathy, (CHRONULAC ) 10 GM/15ML SOLN Take by mouth.     levETIRAcetam  (KEPPRA ) 500 MG tablet Take 1 tablet (500 mg total) by mouth 2 (two) times daily. 180 tablet 4   metoprolol  tartrate (LOPRESSOR ) 25 MG tablet Take 1 tablet (25 mg total) by mouth 2 (two) times daily. 180 tablet 1   metoprolol  tartrate (LOPRESSOR ) 50 MG tablet Take 50 mg by mouth 2 (two) times daily. Takes 75 mg bid     spironolactone  (ALDACTONE ) 25 MG tablet Take 1 tablet (25 mg total) by mouth daily. 90 tablet 1   therapeutic multivitamin-minerals (THERAGRAN-M) tablet Take 1 tablet by mouth daily. 30 tablet 6   thiamine  100 MG tablet Take 1 tablet (100 mg total) by mouth daily. 30 tablet 6   ALLERGY RELIEF 10 MG tablet Take 10 mg by mouth daily.     Cyclobenzaprine HCl (FLEXERIL PO) Take 1 tablet by mouth every 12 (twelve) hours.     dexamethasone (DECADRON) 4 MG tablet Take 4 mg by mouth daily.     diclofenac (VOLTAREN) 50 MG EC tablet Take 50 mg by mouth 2 (two) times daily. Takes 25 mg     diclofenac (VOLTAREN) 75 MG EC tablet Take 75 mg by mouth 2 (two) times daily as needed.     HYDROcodone-acetaminophen  (NORCO/VICODIN) 5-325 MG tablet Take 1 tablet by mouth every 6 (six) hours as needed.      LYCOPENE PO Take by mouth.     pantoprazole  (PROTONIX ) 40 MG tablet Take 1 tablet (40 mg total) by mouth 2 (two) times daily. 180 tablet 4   Current Facility-Administered Medications  Medication Dose Route Frequency Provider Last Rate Last Admin   0.9 %  sodium chloride  infusion  500 mL Intravenous Continuous Charlanne Groom, MD        Allergies  Allergen Reactions   Morphine  Other (See Comments)    Pt states he was out of it   Peach [Prunus Persica] Hives    Fresh peaches    Review of Systems:       Physical Exam:    BP (!) 152/80   Pulse 60   Temp 97.9 F (36.6 C) (Temporal)   Ht 5' 6 (1.676 m)   Wt 135 lb (61.2 kg)   SpO2 98%   BMI 21.79 kg/m  Filed Weights   12/11/24 1255  Weight: 135 lb (61.2 kg)   Gen: awake, alert, NAD HEENT: anicteric, no pallor CV: RRR, no mrg Pulm: CTA b/l Abd: soft, NT/ND, +BS throughout Ext: no c/c/e Neuro: nonfocal  Data Reviewed: I have personally reviewed following labs and imaging studies  CBC:    Latest Ref Rng & Units 11/06/2024   11:58 AM 08/24/2020   11:41 AM 04/01/2019    8:29 AM  CBC  WBC 4.0 - 10.5 K/uL 6.4  6.3  6.9   Hemoglobin 13.0 - 17.0 g/dL 86.0  87.3  86.9   Hematocrit 39.0 - 52.0 % 39.7  36.6  37.9   Platelets 150.0 - 400.0 K/uL 225.0  186  203.0     CMP:    Latest Ref Rng & Units 11/06/2024   11:58 AM 08/24/2020   11:41 AM 04/01/2019    8:29 AM  CMP  Glucose 70 - 99 mg/dL 93  79  82   BUN 6 - 23 mg/dL 16  19  13    Creatinine 0.40 - 1.50 mg/dL 9.38  9.10  0.85   Sodium 135 - 145 mEq/L 137  136  137   Potassium 3.5 - 5.1 mEq/L 4.2  4.7  3.9   Chloride 96 - 112 mEq/L 100  98  102   CO2 19 - 32 mEq/L 29  27  26    Calcium 8.4 - 10.5 mg/dL 8.6  9.8  9.2   Total Protein 6.0 - 8.3 g/dL 6.8  7.7  7.6   Total Bilirubin 0.2 - 1.2 mg/dL 0.8  0.5  0.4   Alkaline Phos 39 - 117 U/L 69  92  97   AST 5 - 37 U/L 18  26  29    ALT 3 - 53 U/L 16  30  31        Latest Ref Rng & Units 11/06/2024   11:58 AM 08/24/2020    11:41 AM 04/01/2019    8:29 AM  Hepatic Function  Total Protein 6.0 - 8.3 g/dL 6.8  7.7  7.6   Albumin 3.5 - 5.2 g/dL 4.3  4.9  4.3   AST 5 - 37 U/L 18  26  29    ALT 3 - 53 U/L 16  30  31    Alk Phosphatase 39 - 117 U/L 69  92  97   Total Bilirubin 0.2 - 1.2 mg/dL 0.8  0.5  0.4   Bilirubin, Direct 0.1 - 0.3 mg/dL 0.2         Anselm Bring, MD 12/11/2024, 1:21 PM  Cc: Tarry Donne Aurora, MD  "

## 2024-12-11 NOTE — Op Note (Signed)
 [facility] Patient Name: Todd Coffey Procedure Date: 12/11/2024 1:21 PM MRN: [id number] Endoscopist: Lynnie Bring , MD, [id number] Age: 64 Referring MD:  Date of Birth: 12-21-1960 Gender: Male Account #: [id number] Procedure:                Upper GI endoscopy Indications:              Dysphagia with history of known esophageal                            strictures status post multiple dilatations in the                            past. Medicines:                Monitored Anesthesia Care Procedure:                Pre-Anesthesia Assessment:                           - Prior to the procedure, a History and Physical                            was performed, and patient medications and                            allergies were reviewed. The patient's tolerance of                            previous anesthesia was also reviewed. The risks                            and benefits of the procedure and the sedation                            options and risks were discussed with the patient.                            All questions were answered, and informed consent                            was obtained. Prior Anticoagulants: The patient has                            taken no anticoagulant or antiplatelet agents. ASA                            Grade Assessment: III - A patient with severe                            systemic disease. After reviewing the risks and                            benefits, the patient was deemed in satisfactory  condition to undergo the procedure.                           After obtaining informed consent, the endoscope was                            passed under direct vision. Throughout the                            procedure, the patient's blood pressure, pulse, and                            oxygen saturations were monitored continuously. The                            GIF [id number] #[id number] was introduced through the                             mouth, and advanced to the second part of duodenum.                            The upper GI endoscopy was accomplished without                            difficulty. The patient tolerated the procedure                            well. Scope In: Scope Out: Findings:                 One benign-appearing, intrinsic moderate                            (circumferential scarring or stenosis; an endoscope                            may pass) stenosis was found 35 cm from the                            incisors with surrounding grade D esophagitis. This                            stenosis measured 1 cm (inner diameter) x less than                            one cm (in length). The stenosis was traversed. A                            TTS dilator was passed through the scope. Dilation                            with a 12-13.5-15 mm balloon dilator was performed  to 15 mm. The dilation site was examined and showed                            moderate mucosal disruption and moderate                            improvement in luminal narrowing. Biopsies were                            taken with a cold forceps for histology. Estimated                            blood loss was minimal.                           A 4 cm hiatal hernia was present. No esophageal or                            fundal varices                           The exam of the stomach was otherwise normal.                           The examined duodenum was normal. Complications:            No immediate complications. Estimated Blood Loss:     Estimated blood loss: none. Impression:               - Benign-appearing esophageal stenosis with                            surrounding esophagitis. Dilated. Biopsied.                           - 4 cm hiatal hernia.                           - Normal examined duodenum. Recommendation:           - Patient has a contact number available for                             emergencies. The signs and symptoms of potential                            delayed complications were discussed with the                            patient. Return to normal activities tomorrow.                            Written discharge instructions were provided to the                            patient.                           -  Protonix  40 mg p.o. twice daily x 4 weeks, then                            QD indefinitely                           - Postdilatation diet                           - Avoid nonsteroidals including Voltaren                           - Await pathology results.                           - Chew food specially meats and breads well and eat                            slowly.                           - Repeat dilatation as needed                           - The findings and recommendations were discussed                            with the patient's family. Lynnie Bring, MD 12/11/2024 1:52:57 PM This report has been signed electronically.

## 2024-12-11 NOTE — Progress Notes (Signed)
 Sedate, gd SR, tolerated procedure well, VSS, report to RN

## 2024-12-11 NOTE — Patient Instructions (Addendum)
 Handouts given: Post dilatation diet Post dilatation diet. Await pathology results. Continue present medications.  Protonix  40 mg by mouth twice daily x 4 weeks, then daily indefinitely. Four Refills currently available. Avoid nonsteroidals including Voltaren. Chew food specially meats and breads well and eat slowly. Repeat dilatation as needed.  YOU HAD AN ENDOSCOPIC PROCEDURE TODAY AT THE [facility]:   Refer to the procedure report that was given to you for any specific questions about what was found during the examination.  If the procedure report does not answer your questions, please call your gastroenterologist to clarify.  If you requested that your care partner not be given the details of your procedure findings, then the procedure report has been included in a sealed envelope for you to review at your convenience later.  YOU SHOULD EXPECT: Some feelings of bloating in the abdomen. Passage of more gas than usual.  Walking can help get rid of the air that was put into your GI tract during the procedure and reduce the bloating. If you had a lower endoscopy (such as a colonoscopy or flexible sigmoidoscopy) you may notice spotting of blood in your stool or on the toilet paper. If you underwent a bowel prep for your procedure, you may not have a normal bowel movement for a few days.  Please Note:  You might notice some irritation and congestion in your nose or some drainage.  This is from the oxygen used during your procedure.  There is no need for concern and it should clear up in a day or so.  SYMPTOMS TO REPORT IMMEDIATELY:  Following upper endoscopy (EGD)  Vomiting of blood or coffee ground material  New chest pain or pain under the shoulder blades  Painful or persistently difficult swallowing  New shortness of breath  Fever of 100F or higher  Black, tarry-looking stools  For urgent or emergent issues, a gastroenterologist can be reached at any hour by calling [phone]  [phone]. Do not use MyChart messaging for urgent concerns.    DIET:  We do recommend a small meal at first, but then you may proceed to your regular diet.  Drink plenty of fluids but you should avoid alcoholic beverages for 24 hours.  ACTIVITY:  You should plan to take it easy for the rest of today and you should NOT DRIVE or use heavy machinery until tomorrow (because of the sedation medicines used during the test).    FOLLOW UP: Our staff will call the number listed on your records the next business day following your procedure.  We will call around 7:15- 8:00 am to check on you and address any questions or concerns that you may have regarding the information given to you following your procedure. If we do not reach you, we will leave a message.     If any biopsies were taken you will be contacted by phone or by letter within the next 1-3 weeks.  Please call us  at [phone] if you have not heard about the biopsies in 3 weeks.    SIGNATURES/CONFIDENTIALITY: You and/or your care partner have signed paperwork which will be entered into your electronic medical record.  These signatures attest to the fact that that the information above on your After Visit Summary has been reviewed and is understood.  Full responsibility of the confidentiality of this discharge information lies with you and/or your care-partner.

## 2024-12-11 NOTE — Progress Notes (Signed)
 Called to room to assist during endoscopic procedure.  Patient ID and intended procedure confirmed with present staff. Received instructions for my participation in the procedure from the performing physician.

## 2024-12-21 ENCOUNTER — Encounter (HOSPITAL_COMMUNITY)

## 2024-12-22 ENCOUNTER — Ambulatory Visit (HOSPITAL_COMMUNITY): Admission: RE | Admit: 2024-12-22 | Admitting: Surgery

## 2024-12-22 ENCOUNTER — Encounter: Admission: RE | Payer: Self-pay
